# Patient Record
Sex: Female | Born: 1970 | Race: White | Hispanic: No | Marital: Married | Smoking: Never smoker
Health system: Southern US, Community
[De-identification: ages and names within clinical notes are randomized; demographics above are authoritative.]

## PROBLEM LIST (undated history)

## (undated) DIAGNOSIS — F3289 Other specified depressive episodes: Secondary | ICD-10-CM

## (undated) DIAGNOSIS — K8020 Calculus of gallbladder without cholecystitis without obstruction: Secondary | ICD-10-CM

## (undated) DIAGNOSIS — M1990 Unspecified osteoarthritis, unspecified site: Secondary | ICD-10-CM

## (undated) DIAGNOSIS — F329 Major depressive disorder, single episode, unspecified: Secondary | ICD-10-CM

## (undated) DIAGNOSIS — E039 Hypothyroidism, unspecified: Secondary | ICD-10-CM

## (undated) DIAGNOSIS — J45909 Unspecified asthma, uncomplicated: Secondary | ICD-10-CM

## (undated) DIAGNOSIS — D649 Anemia, unspecified: Secondary | ICD-10-CM

## (undated) DIAGNOSIS — E7800 Pure hypercholesterolemia, unspecified: Secondary | ICD-10-CM

## (undated) DIAGNOSIS — R5381 Other malaise: Secondary | ICD-10-CM

## (undated) DIAGNOSIS — R5383 Other fatigue: Secondary | ICD-10-CM

## (undated) DIAGNOSIS — I722 Aneurysm of renal artery: Secondary | ICD-10-CM

## (undated) DIAGNOSIS — F419 Anxiety disorder, unspecified: Secondary | ICD-10-CM

## (undated) DIAGNOSIS — R42 Dizziness and giddiness: Secondary | ICD-10-CM

## (undated) DIAGNOSIS — I773 Arterial fibromuscular dysplasia: Secondary | ICD-10-CM

## (undated) DIAGNOSIS — K279 Peptic ulcer, site unspecified, unspecified as acute or chronic, without hemorrhage or perforation: Secondary | ICD-10-CM

## (undated) DIAGNOSIS — J309 Allergic rhinitis, unspecified: Secondary | ICD-10-CM

## (undated) DIAGNOSIS — G459 Transient cerebral ischemic attack, unspecified: Secondary | ICD-10-CM

## (undated) DIAGNOSIS — K219 Gastro-esophageal reflux disease without esophagitis: Secondary | ICD-10-CM

## (undated) DIAGNOSIS — M79609 Pain in unspecified limb: Secondary | ICD-10-CM

## (undated) DIAGNOSIS — J328 Other chronic sinusitis: Secondary | ICD-10-CM

## (undated) DIAGNOSIS — K769 Liver disease, unspecified: Secondary | ICD-10-CM

## (undated) DIAGNOSIS — R1011 Right upper quadrant pain: Secondary | ICD-10-CM

## (undated) DIAGNOSIS — N943 Premenstrual tension syndrome: Secondary | ICD-10-CM

## (undated) DIAGNOSIS — E042 Nontoxic multinodular goiter: Secondary | ICD-10-CM

## (undated) DIAGNOSIS — R519 Headache, unspecified: Secondary | ICD-10-CM

## (undated) HISTORY — DX: Other chronic sinusitis: J32.8

## (undated) HISTORY — DX: Right upper quadrant pain: R10.11

## (undated) HISTORY — DX: Other specified depressive episodes: F32.89

## (undated) HISTORY — DX: Hypothyroidism, unspecified: E03.9

## (undated) HISTORY — DX: Pure hypercholesterolemia, unspecified: E78.00

## (undated) HISTORY — PX: 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: SHX888

## (undated) HISTORY — DX: Arterial fibromuscular dysplasia: I77.3

## (undated) HISTORY — DX: Pain in unspecified limb: M79.609

## (undated) HISTORY — DX: Allergic rhinitis, unspecified: J30.9

## (undated) HISTORY — DX: Peptic ulcer, site unspecified, unspecified as acute or chronic, without hemorrhage or perforation: K27.9

## (undated) HISTORY — DX: Calculus of gallbladder without cholecystitis without obstruction: K80.20

## (undated) HISTORY — DX: Dizziness and giddiness: R42

## (undated) HISTORY — DX: Other fatigue: R53.83

## (undated) HISTORY — DX: Liver disease, unspecified: K76.9

## (undated) HISTORY — DX: Nontoxic multinodular goiter: E04.2

## (undated) HISTORY — DX: Premenstrual tension syndrome: N94.3

## (undated) HISTORY — DX: Major depressive disorder, single episode, unspecified: F32.9

## (undated) HISTORY — DX: Other malaise: R53.81

## (undated) HISTORY — DX: Gastro-esophageal reflux disease without esophagitis: K21.9

## (undated) HISTORY — DX: Aneurysm of renal artery: I72.2

## (undated) MED FILL — Iron Sucrose Inj 20 MG/ML (Fe Equiv): INTRAVENOUS | Qty: 10 | Status: AC

---

## 1998-01-07 HISTORY — PX: LEEP: SHX91

## 1999-04-03 ENCOUNTER — Encounter: Payer: Self-pay | Admitting: Family Medicine

## 2000-01-08 HISTORY — PX: DILATION AND CURETTAGE OF UTERUS: SHX78

## 2007-10-30 ENCOUNTER — Encounter: Payer: Self-pay | Admitting: Family Medicine

## 2007-11-18 ENCOUNTER — Encounter: Payer: Self-pay | Admitting: Family Medicine

## 2008-02-08 ENCOUNTER — Encounter: Payer: Self-pay | Admitting: Family Medicine

## 2008-05-07 LAB — CONVERTED CEMR LAB: Pap Smear: NORMAL

## 2008-05-13 ENCOUNTER — Encounter: Payer: Self-pay | Admitting: Family Medicine

## 2008-09-08 ENCOUNTER — Ambulatory Visit: Payer: Self-pay | Admitting: Family Medicine

## 2008-09-08 DIAGNOSIS — E042 Nontoxic multinodular goiter: Secondary | ICD-10-CM | POA: Insufficient documentation

## 2008-09-08 DIAGNOSIS — R1011 Right upper quadrant pain: Secondary | ICD-10-CM

## 2008-09-08 DIAGNOSIS — Z8639 Personal history of other endocrine, nutritional and metabolic disease: Secondary | ICD-10-CM

## 2008-09-08 DIAGNOSIS — K219 Gastro-esophageal reflux disease without esophagitis: Secondary | ICD-10-CM | POA: Insufficient documentation

## 2008-09-08 DIAGNOSIS — J309 Allergic rhinitis, unspecified: Secondary | ICD-10-CM

## 2008-09-08 DIAGNOSIS — K279 Peptic ulcer, site unspecified, unspecified as acute or chronic, without hemorrhage or perforation: Secondary | ICD-10-CM | POA: Insufficient documentation

## 2008-09-08 DIAGNOSIS — F325 Major depressive disorder, single episode, in full remission: Secondary | ICD-10-CM | POA: Insufficient documentation

## 2008-09-08 DIAGNOSIS — E7800 Pure hypercholesterolemia, unspecified: Secondary | ICD-10-CM | POA: Insufficient documentation

## 2008-09-08 DIAGNOSIS — G43009 Migraine without aura, not intractable, without status migrainosus: Secondary | ICD-10-CM

## 2008-12-12 ENCOUNTER — Ambulatory Visit: Payer: Self-pay | Admitting: Family Medicine

## 2008-12-12 DIAGNOSIS — J328 Other chronic sinusitis: Secondary | ICD-10-CM

## 2009-01-04 ENCOUNTER — Telehealth: Payer: Self-pay | Admitting: Family Medicine

## 2009-05-16 ENCOUNTER — Ambulatory Visit: Payer: Self-pay | Admitting: Family Medicine

## 2009-05-22 ENCOUNTER — Telehealth: Payer: Self-pay | Admitting: Family Medicine

## 2009-10-04 ENCOUNTER — Ambulatory Visit: Payer: Self-pay | Admitting: Family Medicine

## 2009-10-04 DIAGNOSIS — M79609 Pain in unspecified limb: Secondary | ICD-10-CM | POA: Insufficient documentation

## 2009-10-04 DIAGNOSIS — R5381 Other malaise: Secondary | ICD-10-CM | POA: Insufficient documentation

## 2009-10-04 DIAGNOSIS — R42 Dizziness and giddiness: Secondary | ICD-10-CM | POA: Insufficient documentation

## 2009-10-04 DIAGNOSIS — R5383 Other fatigue: Secondary | ICD-10-CM

## 2009-10-11 ENCOUNTER — Ambulatory Visit: Payer: Self-pay | Admitting: Family Medicine

## 2009-10-12 LAB — CONVERTED CEMR LAB
Alkaline Phosphatase: 57 units/L (ref 39–117)
Basophils Absolute: 0 10*3/uL (ref 0.0–0.1)
Bilirubin, Direct: 0.1 mg/dL (ref 0.0–0.3)
CO2: 29 meq/L (ref 19–32)
Calcium: 8.9 mg/dL (ref 8.4–10.5)
Eosinophils Absolute: 0.1 10*3/uL (ref 0.0–0.7)
GFR calc non Af Amer: 78.18 mL/min (ref >60)
HCT: 36.4 % (ref 36.0–46.0)
Hemoglobin: 12.4 g/dL (ref 12.0–15.0)
Lymphs Abs: 1.5 10*3/uL (ref 0.7–4.0)
MCHC: 34 g/dL (ref 30.0–36.0)
MCV: 94 fL (ref 78.0–100.0)
Neutro Abs: 4.5 10*3/uL (ref 1.4–7.7)
RDW: 13.4 % (ref 11.5–14.6)
Sodium: 140 meq/L (ref 135–145)
Total Bilirubin: 0.5 mg/dL (ref 0.3–1.2)

## 2009-11-09 ENCOUNTER — Telehealth: Payer: Self-pay | Admitting: Family Medicine

## 2010-01-17 ENCOUNTER — Telehealth: Payer: Self-pay | Admitting: Family Medicine

## 2010-01-19 ENCOUNTER — Ambulatory Visit: Admit: 2010-01-19 | Payer: Self-pay | Admitting: Family Medicine

## 2010-01-22 ENCOUNTER — Telehealth: Payer: Self-pay | Admitting: Family Medicine

## 2010-02-06 NOTE — Progress Notes (Signed)
Summary: Request for Motrin 800mg   Phone Note Call from Patient   Caller: Patient Reason for Call: Refill Medication Action Taken: Patient advised to go to ER Details for Reason: Requesting Motrin 800mg  for migraine Summary of Call: Pt states she has had a migraine for a week and has taken 800mg  of Motrin in the past with relief.  Pt states that she was in the office last week for a sinus infection and that she received a muscle relaxer at that time and that the muscle relaxer, in her words, "knocks her out" and she can't take because she has six children and this is not working for her.  Patient said that she would like to ask for a prescription for Motrin 800mg  [facility].  The patient can be reached at [phone].  Thanks Initial call taken by: Clarisa Schools,  May 22, 2009 3:23 PM  Follow-up for Phone Call        Patient said it is okay to leave a message on her voice mail if she does not answer when you call back. Follow-up by: Clarisa Schools,  May 22, 2009 3:26 PM  Additional Follow-up for Phone Call Additional follow up Details #1::        PUD - I would avoid in this case With persistent HA and only dosing of Amox 500 mg a day two times a day, would consider high dose therapy. Amox 1500 mg by mouth two times a day vs Augmentin 875 two times a day  With sinus infection  Non-emergent - I would rather get Dr. Daphine Deutscher input in AM with this, since I am not familiar with case Additional Follow-up by: Hannah Beat MD,  May 22, 2009 3:54 PM    Additional Follow-up for Phone Call Additional follow up Details #2::    Short term ibupfroen as long as taken with prilosec 40 mg daily should be okay in this pt.  COntinue current antibiotic plan.   Additional Follow-up for Phone Call Additional follow up Details #3:: Details for Additional Follow-up Action Taken: left message with info on machine that patient okayed.Consuello Masse CMA  Additional Follow-up  by: Benny Lennert CMA Duncan Dull),  May 23, 2009 9:06 AM  New/Updated Medications: IBUPROFEN 800 MG TABS (IBUPROFEN) 1 tab by mouth three times a day as needed headache Prescriptions: IBUPROFEN 800 MG TABS (IBUPROFEN) 1 tab by mouth three times a day as needed headache  #15 x 0   Entered by:   Kerby Nora MD   Authorized by:   Hannah Beat MD   Signed by:   Kerby Nora MD on 05/22/2009   Method used:   [facility]        [facility]  [place] (retail)       [address]       [place], [place]  [address]       Ph: [phone] or [phone]       Fax: [phone]   RxID:   [id number]

## 2010-02-06 NOTE — Progress Notes (Signed)
Summary: Request change of medication  Phone Note Call from Patient Call back at [phone]   Caller: Patient Call For: Kerby Nora MD Summary of Call: Patient has been  taking Motrin for her headaches and helps some. Patient said that her brother takes Naproxen 500mg  for his headaches and that works good for him. Patient wants to know if you will prescribe Naproxen for her?. [facility] Initial call taken by: Sydell Axon LPN,  November 09, 2009 12:21 PM  Follow-up for Phone Call        alleve 2 tabs twice a day is same as this dose of naprosyn   if req rx call in naprosyn 500 mg, 1 by mouth two times a day, #60, 3 refills Follow-up by: Hannah Beat MD,  November 09, 2009 12:55 PM  Additional Follow-up for Phone Call Additional follow up Details #1::        Patient advised of info and rx sent to pharmacy as requested by patient.Consuello Masse CMA   Additional Follow-up by: Benny Lennert CMA Duncan Dull),  November 09, 2009 12:58 PM    New/Updated Medications: NAPROSYN 500 MG TABS (NAPROXEN) take one tablet 2 times daily Prescriptions: NAPROSYN 500 MG TABS (NAPROXEN) take one tablet 2 times daily  #60 x 3   Entered by:   Benny Lennert CMA (AAMA)   Authorized by:   Hannah Beat MD   Signed by:   Benny Lennert CMA (AAMA) on 11/09/2009   Method used:   [facility]        [facility]  [place] (retail)       [address]       [place], [place]  [address]       Ph: [phone] or [phone]       Fax: [phone]   RxID:   [id number]

## 2010-02-06 NOTE — Assessment & Plan Note (Signed)
Summary: sinus infection/alc   Vital Signs:  Patient profile:   40 year old female Weight:      137.75 pounds Temp:     98.7 degrees F oral Pulse rate:   68 / minute Pulse rhythm:   regular BP sitting:   120 / 84  (left arm) Cuff size:   regular  Vitals Entered By: Sydell Axon LPN (May 16, 2009 3:59 PM) CC: ? Sinus infection, headaches, some dizziness, sore throat and sinus pressure X 3 weeks   History of Present Illness: Pt thinks she has a sinus infection. She has headaches left sided, she has had chills, mild right ear pain, mils rightsided ST, no rhinitis but nasal congestion, mild cough at night minimally productive. She had sxs for three weeks, last Mon better and thought she was getting over this and then worsened again. She deniesSOB, some nausea with the headaches, no vomoiting. She has had sinus problems for a long time in her life, has been told she needs sinus surgey and has a deviated septum. She has taken sudafed and Zyrtec. She also relates often having right sided posterior neck pain and headaches and has had TMJ discomfort, seen by massage therapist with some help. She has also had some sluggishness of hearing distortion at times.  Problems Prior to Update: 1)  Other Chronic Sinusitis  (ICD-473.8) 2)  Abdominal Pain, Lower  (ICD-789.09) 3)  Allergic Rhinitis  (ICD-477.9) 4)  Goiter, Multinodular  (ICD-241.1) 5)  Hx of Unspecified Hypothyroidism  (ICD-244.9) 6)  Hypercholesterolemia  (ICD-272.0) 7)  Peptic Ulcer Disease  (ICD-533.90) 8)  Gerd  (ICD-530.81) 9)  Migraine, Menstrual  (ICD-625.4) 10)  Depression  (ICD-311)  Medications Prior to Update: 1)  Sumatriptan Succinate 100 Mg Tabs (Sumatriptan Succinate) .Marland Kitchen.. 1 Tab By Mouth As Needed Migraine, Repeat in 2 Hours X 1 If Not Improved. Max 200 Mg in 24 Hours.  Allergies: No Known Drug Allergies  Physical Exam  General:  Well-developed,well-nourished,in no acute distress; alert,appropriate and cooperative  throughout examination Head:  Normocephalic and atraumatic without obvious abnormalities. No apparent alopecia or balding. Sinuses minimally tender in the right max area.  Eyes:  Conjunctiva clear bilaterally.  Ears:  TMS bilat thick and scarred, no erythema, can't discern fluid but has poor mobility. Nose:  Deviated left with narrowed nare on left, mild erythema of the mucosal membr bilat. Mouth:  pharynx pink and moist.  Mild tan PND. Neck:  no carotid bruit or thyromegaly no cervical or supraclavicular lymphadenopathy  Lungs:  Normal respiratory effort, chest expands symmetrically. Lungs are clear to auscultation, no crackles or wheezes. Heart:  Normal rate and regular rhythm. S1 and S2 normal without gallop, murmur, click, rub or other extra sounds.   Impression & Recommendations:  Problem # 1:  OTHER CHRONIC SINUSITIS (ICD-473.8) Assessment Deteriorated Recurrent. See instructions. Her updated medication list for this problem includes:    Amoxicillin 500 Mg Caps (Amoxicillin) .Marland Kitchen... 2 tabs by mouth two times a day for three weeks Have cup of Dannon yogurt daily with Amox.  Take antibiotics for full duration. Discussed treatment options including indications for coronal CT scan of sinuses and ENT referral. When sxs improve, stay on one Guaifenesin daily.  Problem # 2:  HEADACHE, TENSION RIGHTSIDED (ICD-307.81) Assessment: New  Trial of flexeril and tyl. Also use heat and ice. Avoid NSAIDs due to PUD. Her updated medication list for this problem includes:    Sumatriptan Succinate 100 Mg Tabs (Sumatriptan succinate) .Marland Kitchen... 1 tab by mouth  as needed migraine, repeat in 2 hours x 1 if not improved. max 200 mg in 24 hours.  Headache diary reviewed.  Complete Medication List: 1)  Sumatriptan Succinate 100 Mg Tabs (Sumatriptan succinate) .Marland Kitchen.. 1 tab by mouth as needed migraine, repeat in 2 hours x 1 if not improved. max 200 mg in 24 hours. 2)  Amoxicillin 500 Mg Caps (Amoxicillin) .... 2  tabs by mouth two times a day for three weeks 3)  Flexeril 10 Mg Tabs (Cyclobenzaprine hcl) .Marland Kitchen.. 1 tab by mouth three times a day  Patient Instructions: 1)  Take Amox for three weeks. 2)  Take Guaifenesin by [facility] and getting MUCOUS RELIEF EXPECTORANT (400mg ), take 11/2 tabs by mouth AM and NOON. 3)  Drink lots of fluids anytime taking Guaifenesin.  4)  Tyl ES 2 tabs  5)  25 mns spent after OBTW tension h/as. 6)  Use flexeril for tension headaches.  Prescriptions: FLEXERIL 10 MG TABS (CYCLOBENZAPRINE HCL) 1 tab by mouth three times a day  #40 x 0   Entered and Authorized by:   Shaune Leeks MD   Signed by:   Shaune Leeks MD on 05/16/2009   Method used:   [facility]        [facility]  [id number] ([facility]ail)       [address]       [place], [place]  [address]       Ph: [phone] or [phone]       Fax: [phone]   RxID:   [id number] AMOXICILLIN 500 MG CAPS (AMOXICILLIN) 2 tabs by mouth two times a day for three weeks  #84 x 0   Entered and Authorized by:   Shaune Leeks MD   Signed by:   Shaune Leeks MD on 05/16/2009   Method used:   Electronically to        [facility]  [place] (retail)       [address]       [place], [place]  [address]       Ph: [phone] or [phone]       Fax: [phone]   RxID:   [id number]   Current Allergies (reviewed today): No known allergies

## 2010-02-06 NOTE — Assessment & Plan Note (Signed)
Summary: 30 MIN F/U/CLE   Vital Signs:  Patient profile:   40 year old female Height:      62 inches Weight:      137 pounds BMI:     25.15 Temp:     99.1 degrees F oral Pulse rate:   72 / minute Pulse rhythm:   regular BP sitting:   110 / 78  (left arm) Cuff size:   regular  Vitals Entered By: Linde Gillis CMA Duncan Dull) (October 04, 2009 4:10 PM) CC: 30 minute follow up   History of Present Illness:   Last seen 1 year ago..never returned for 2 week follow up on multiple medical issues.   Overdue for thyroid check. HAs history   Legs weak and lightheaded (no vertigo) when walking up stairs, but able to do aerobics with no problem. Mild associated SOB, no chest pain.  Ongoing in past few months.  Exposed to mold recently.  Using Zyrtec..minimal benifit.   In last few days congestion..sinus pressure pain. No fever Has chronic sinus infection history..Told by ENT .  she would need sinus surgery.  Use guafenesin daily...helps somewhat.   Taking hydroxycut daily to help with fatigue.  HAving trouble losing weight. Skipping meals   Allergies (verified): No Known Drug Allergies  Review of Systems       feet cold a lot urinary frequency if drinks more fluids.  intermittant pain in right upper quadrant...last few hours... occuring several hours after eating fatty food. General:  Complains of fatigue. CV:  Denies chest pain or discomfort. Resp:  Denies shortness of breath. GI:  Denies vomiting.  Physical Exam  General:  Well-developed,well-nourished,in no acute distress; alert,appropriate and cooperative throughout examination Eyes:  No corneal or conjunctival inflammation noted. EOMI. Perrla. Funduscopic exam benign, without hemorrhages, exudates or papilledema. Vision grossly normal. Ears:  External ear exam shows no significant lesions or deformities.  Otoscopic examination reveals clear canals, tympanic membranes are intact bilaterally without bulging,  retraction, inflammation or discharge. Hearing is grossly normal bilaterally. Nose:  nasal dischargemucosal pallor.   Mouth:  Oral mucosa and oropharynx without lesions or exudates.  Teeth in good repair. Neck:  no carotid bruit or thyromegaly no cervical or supraclavicular lymphadenopathy  Lungs:  Normal respiratory effort, chest expands symmetrically. Lungs are clear to auscultation, no crackles or wheezes. Heart:  Normal rate and regular rhythm. S1 and S2 normal without gallop, murmur, click, rub or other extra sounds. Abdomen:  Bowel sounds positive,abdomen soft and non-tender without masses, organomegaly or hernias noted. Msk:  No pain , redness or swelling in toes currently Pulses:  R and L posterior tibial pulses are full and equal bilaterally  Extremities:  no edema Neurologic:  No cranial nerve deficits noted. Station and gait are normal. DTRs are symmetrical throughout. Sensory, motor and coordinative functions appear intact. Skin:  Intact without suspicious lesions or rashes Psych:  Cognition and judgment appear intact. Alert and cooperative with normal attention span and concentration. No apparent delusions, illusions, hallucinations   Impression & Recommendations:  Problem # 1:  DIZZINESS (ICD-780.4) Likely due to poor diet and hydration status...may be due to thyroid issue and /or allergies. Eval with labs.   Problem # 2:  FATIGUE (ICD-780.79) Eval with labs.   Problem # 3:  ALLERGIC RHINITIS (ICD-477.9) Treat with Zyrtec at bedtime..no decongestant. Nasal saline irrigation. Consider nasal steroid spray if not improving.   Problem # 4:  GOITER, MULTINODULAR (ICD-241.1) Over due for reeval.   Problem # 5:  HYPERCHOLESTEROLEMIA (ICD-272.0) Over due for reeval.   Problem # 6:  RUQ PAIN (ICD-789.01) If labs negative consider gallbladder eval.   Problem # 7:  TOE PAIN (ICD-729.5) Pt concerned about gout as it runs in family.Marland Kitcheneval uric acid although pain is atypical for  gout.   Complete Medication List: 1)  Ibuprofen 800 Mg Tabs (Ibuprofen) .Marland Kitchen.. 1 tab by mouth three times a day as needed headache  Patient Instructions: 1)  Fasting lipids, CMET Dx 272.0, TSH Dx 244.9, B12, cbc Dx 780.79, uric acid Dx 729.5 2)  Try to increase water in diet, eat three meals in a day. 3)   Increase fiber, fruit and veggies. 4)  Zyrtec at bedtime..no decongestant. 5)  Stop hydroxycut.Marland Kitchenit is a stimulant. 6)  Nasal saline irrigation...3-4 times daily.  7)  Schedule CPX with PAP. 8)  Work on [employer].Marland Kitchenlow fat diet.  Current Allergies (reviewed today): No known allergies

## 2010-02-08 NOTE — Letter (Signed)
Summary: Records from [facility] 2008 - 2010  Records from [facility] 2008 - 2010   Imported By: Maryln Gottron 01/30/2010 12:55:27  _____________________________________________________________________  External Attachment:    Type:   Image     Comment:   External Document

## 2010-02-08 NOTE — Progress Notes (Signed)
Summary: previous md  Phone Note From Other Clinic   Details for Reason: Old Records Summary of Call: Reviewed old records from [place]. Please call pt ... she needs to have CPX/pap (last in 05/2008) done here if not seeing GYN...if she is seeing GYN.Marland Kitchen update last pap.  Initial call taken by: Kerby Nora MD,  January 22, 2010 2:04 PM  Follow-up for Phone Call        Patient says her husband just swicthed jobs and they have no insurance right now so she will schedule when new insurances goes in effect Follow-up by: Benny Lennert CMA Duncan Dull),  January 23, 2010 8:11 AM

## 2010-02-08 NOTE — Progress Notes (Signed)
Summary: refill request for flexeril  Phone Note Refill Request Call back at Home Phone [phone] Message from:  Patient  Refills Requested: Medication #1:  flexeril This is no longer on med list.  Pt takes it for her migraines and she is out.  [facility]  Call pt and let her know when called in.  Initial call taken by: Lowella Petties CMA, AAMA,  January 17, 2010 9:05 AM  Follow-up for Phone Call        HSe was previously on flexeril 10 mg three times a day... I am not comfortable using it three times a day.. .can oversedate her..will write for 1 tab by mouth daily.  Follow-up by: Kerby Nora MD,  January 17, 2010 10:11 PM  Additional Follow-up for Phone Call Additional follow up Details #1::        Rx called to pharmacy Additional Follow-up by: Benny Lennert CMA Duncan Dull),  January 18, 2010 7:56 AM    New/Updated Medications: CYCLOBENZAPRINE HCL 10 MG TABS (CYCLOBENZAPRINE HCL) 1 tab by mouth daily as needed migraine Prescriptions: CYCLOBENZAPRINE HCL 10 MG TABS (CYCLOBENZAPRINE HCL) 1 tab by mouth daily as needed migraine  #30 x 0   Entered and Authorized by:   Kerby Nora MD   Signed by:   Kerby Nora MD on 01/17/2010   Method used:   Telephoned to ...       [facility]  [place] (retail)       [address]       [place], [place]  [address]       Ph: [phone] or [phone]       Fax: [phone]   RxID:   [id number]

## 2010-02-08 NOTE — Letter (Signed)
Summary: Records from Dr. Paralee Cancel  Records from Dr. Paralee Cancel   Imported By: Maryln Gottron 01/30/2010 12:52:37  _____________________________________________________________________  External Attachment:    Type:   Image     Comment:   External Document

## 2010-05-09 ENCOUNTER — Encounter: Payer: Self-pay | Admitting: Family Medicine

## 2010-05-09 ENCOUNTER — Ambulatory Visit (INDEPENDENT_AMBULATORY_CARE_PROVIDER_SITE_OTHER): Payer: 59 | Admitting: Family Medicine

## 2010-05-09 DIAGNOSIS — B9689 Other specified bacterial agents as the cause of diseases classified elsewhere: Secondary | ICD-10-CM | POA: Insufficient documentation

## 2010-05-09 DIAGNOSIS — J019 Acute sinusitis, unspecified: Secondary | ICD-10-CM

## 2010-05-09 MED ORDER — AZITHROMYCIN 250 MG PO TABS: ORAL_TABLET | ORAL | Status: AC

## 2010-05-09 NOTE — Assessment & Plan Note (Signed)
In history of chronic sinusitis. States recently used agumentin (1-2 mo ago), zpack usually works well for her.   Will treat with zpack and supportive care as outlined in instructions. Red flags to return discussed.

## 2010-05-09 NOTE — Patient Instructions (Signed)
You have a sinus infection. Take medicine as prescribed: antibiotic as prescribed Push fluids and plenty of rest. Nasal saline irrigation or neti pot to help drain sinuses. May use simple mucinex with plenty of fluid to help mobilize mucous. Return if fever >101.5, trouble opening/closing mouth, difficulty swallowing, or worsening.

## 2010-05-09 NOTE — Progress Notes (Signed)
  Subjective:    Patient ID: Isabel Foley, female    DOB: 05-08-1970, 40 y.o.   MRN: [id number]  HPI CC: HA  10 d h/o sinus pressure HA behind R eye, as well as chills and dizziness.  Started with achiness.  Almost like a migraine (h/o same).  Some R eye blurriness.  No PNDrainage.  Not really having to blow nose but feels it's all stuck inside sinuses.  Recently seen by dentist, told teeth looking fine.  Mild R ear pain  H/o deviated septum as well as small cyst R sinuses.  Seen by ENT in past ([place]).  Told has chronic sinusitis.  Trying mucinex sinus as well as alavert.  No cough, abd pain, n/v/d, myalgias.  Husband smokes outside.  No sick contacts.  No h/o asthma.  States had recent abx course with augmentin 2 mo ago strep throat infection, treated at [facility].  Review of Systems Per HPI    Objective:   Physical Exam  Vitals reviewed. Constitutional: She appears well-developed and well-nourished. No distress.  HENT:  Head: Normocephalic and atraumatic.  Right Ear: Hearing, tympanic membrane, external ear and ear canal normal.  Left Ear: Hearing, tympanic membrane, external ear and ear canal normal.  Nose: No mucosal edema or rhinorrhea. Right sinus exhibits maxillary sinus tenderness and frontal sinus tenderness. Left sinus exhibits no maxillary sinus tenderness and no frontal sinus tenderness.  Mouth/Throat: Uvula is midline, oropharynx is clear and moist and mucous membranes are normal. No oropharyngeal exudate.  Eyes: Conjunctivae and EOM are normal. Pupils are equal, round, and reactive to light. No scleral icterus.       No pain with eye movements  Neck: Normal range of motion. Neck supple. No JVD present. No thyromegaly present.  Cardiovascular: Normal rate, regular rhythm, normal heart sounds and intact distal pulses.   No murmur heard. Pulmonary/Chest: Effort normal and breath sounds normal. No respiratory distress. She has no wheezes. She has no rales.  Lymphadenopathy:   She has no cervical adenopathy.  Skin: Skin is warm and dry. No rash noted.          Assessment & Plan:

## 2010-09-11 ENCOUNTER — Other Ambulatory Visit: Payer: Self-pay | Admitting: Family Medicine

## 2010-10-31 ENCOUNTER — Encounter: Payer: Self-pay | Admitting: Family Medicine

## 2010-10-31 ENCOUNTER — Ambulatory Visit (INDEPENDENT_AMBULATORY_CARE_PROVIDER_SITE_OTHER): Payer: 59 | Admitting: Family Medicine

## 2010-10-31 VITALS — BP 102/78 | HR 83 | Temp 98.8°F | Ht 62.0 in | Wt 123.4 lb

## 2010-10-31 DIAGNOSIS — R300 Dysuria: Secondary | ICD-10-CM

## 2010-10-31 DIAGNOSIS — N390 Urinary tract infection, site not specified: Secondary | ICD-10-CM

## 2010-10-31 LAB — POCT URINALYSIS DIPSTICK
Blood, UA: NEGATIVE
Nitrite, UA: NEGATIVE
Spec Grav, UA: 1.015
Urobilinogen, UA: NEGATIVE
pH, UA: 7

## 2010-10-31 MED ORDER — FLUCONAZOLE 150 MG PO TABS: 150.0000 mg | ORAL_TABLET | Freq: Once | ORAL | Status: AC

## 2010-10-31 MED ORDER — NITROFURANTOIN MONOHYD MACRO 100 MG PO CAPS: 100.0000 mg | ORAL_CAPSULE | Freq: Two times a day (BID) | ORAL | Status: AC

## 2010-10-31 NOTE — Progress Notes (Signed)
  Subjective:    Patient ID: Isabel Foley, female    DOB: 01/13/1970, 40 y.o.   MRN: [id number]  HPI  Isabel Foley, a 40 y.o. female presents today in the office for the following:    Pain in low back, sometimes nausous. Urine has been cloudy -- had some test strips.   Patient presents with burning, urgency. No vaginal discharge or external irritation.  No STD exposure. No abd pain, no flank pain.  ROS: GEN:  no fevers, chills. GI: No n/v/d, eating normally Otherwise, ROS is as per the HPI.  PHYSICAL EXAM  Blood pressure 102/78, pulse 83, temperature 98.8 F (37.1 C), temperature source Oral, height 5\' 2"  (1.575 m), weight 123 lb 6.4 oz (55.974 kg), SpO2 98.00%.  GEN: WDWN, A&Ox4,NAD. Non-toxic HEENT: Atraumatc, normocephalic. CV: RRR, No M/G/R PULM: CTA B, No wheezes, crackles, or rhonchi ABD: S, NT, ND, +BS, no rebound. No CVAT. No suprapubic tenderness. EXT: No c/c/e  A/P: UTI. Rx with ABX as below Check cx Review of Systems     Objective:   Physical Exam        Assessment & Plan:

## 2011-01-21 ENCOUNTER — Ambulatory Visit (INDEPENDENT_AMBULATORY_CARE_PROVIDER_SITE_OTHER): Payer: 59 | Admitting: Family Medicine

## 2011-01-21 ENCOUNTER — Encounter: Payer: Self-pay | Admitting: Family Medicine

## 2011-01-21 VITALS — BP 104/74 | HR 80 | Temp 98.0°F | Ht 62.0 in | Wt 124.2 lb

## 2011-01-21 DIAGNOSIS — J029 Acute pharyngitis, unspecified: Secondary | ICD-10-CM

## 2011-01-21 DIAGNOSIS — J020 Streptococcal pharyngitis: Secondary | ICD-10-CM

## 2011-01-21 MED ORDER — FLUCONAZOLE 150 MG PO TABS: 150.0000 mg | ORAL_TABLET | Freq: Once | ORAL | Status: AC

## 2011-01-21 MED ORDER — AMOXICILLIN 500 MG PO CAPS: 500.0000 mg | ORAL_CAPSULE | Freq: Three times a day (TID) | ORAL | Status: AC

## 2011-01-21 NOTE — Patient Instructions (Signed)
Take the amoxicillin for strep  Drink lots of fluids  Use diflucan if you get a yeast infection Take acetaminophen for pain and fever  Chloraseptic throat spray and salt water gargles are helpful  I sent px to your pharmacy

## 2011-01-21 NOTE — Progress Notes (Signed)
Subjective:    Patient ID: Isabel Foley, female    DOB: 1970-07-05, 41 y.o.   MRN: [id number]  HPI Here with uri symptoms with sore throat and pos rapid strep test  Is exposed to a lot of moms and kids  Head is congested - cannot get a lot out - more post nasal drip  Also some cough -- is dry -- little mucous last night   Sore throat - hurts to swallow -- worse this am   Not sure if fever - last night had chills and aches Took mucinex complete today   Patient Active Problem List  Diagnoses  . GOITER, MULTINODULAR  . UNSPECIFIED HYPOTHYROIDISM  . HYPERCHOLESTEROLEMIA  . DEPRESSION  . OTHER CHRONIC SINUSITIS  . ALLERGIC RHINITIS  . GERD  . PEPTIC ULCER DISEASE  . MIGRAINE, MENSTRUAL  . TOE PAIN  . DIZZINESS  . FATIGUE  . RUQ PAIN  . Acute sinusitis  . Strep throat   Past Medical History  Diagnosis Date  . Allergic rhinitis, cause unspecified   . Depressive disorder, not elsewhere classified   . Dizziness and giddiness   . Other malaise and fatigue   . Esophageal reflux   . Nontoxic multinodular goiter   . Pure hypercholesterolemia   . Premenstrual tension syndromes   . Other chronic sinusitis   . Peptic ulcer, unspecified site, unspecified as acute or chronic, without mention of hemorrhage, perforation, or obstruction   . Abdominal pain, right upper quadrant   . Pain in limb   . Unspecified hypothyroidism    Past Surgical History  Procedure Date  . Dilation and curettage of uterus 2002  . Leep 2000   History  Substance Use Topics  . Smoking status: Never Smoker   . Smokeless tobacco: Not on file  . Alcohol Use: No   Family History  Problem Relation Age of Onset  . Hypertension Father   . Hypothyroidism Father   . Atrial fibrillation Brother 20  . Other Sister     Collagen Vascular Disease   No Known Allergies Current Outpatient Prescriptions on File Prior to Visit  Medication Sig Dispense Refill  . montelukast (SINGULAIR) 10 MG tablet Take 10 mg by  mouth at bedtime.            Review of Systems Review of Systems  Constitutional: Negative for fever, appetite change, fatigue and unexpected weight change.  Eyes: Negative for pain and visual disturbance.  ENt pos for ST and some congestion and ear pain / neg for sinus pain  Respiratory: Negative for cough and shortness of breath.   Cardiovascular: Negative for cp or palpitations    Gastrointestinal: Negative for nausea, diarrhea and constipation.  Genitourinary: Negative for urgency and frequency.  Skin: Negative for pallor or rash   Neurological: Negative for weakness, light-headedness, numbness and headaches.  Hematological: Negative for adenopathy. Does not bruise/bleed easily.  Psychiatric/Behavioral: Negative for dysphoric mood. The patient is not nervous/anxious.          Objective:   Physical Exam  Constitutional: She appears well-developed and well-nourished. No distress.  HENT:  Head: Normocephalic and atraumatic.  Right Ear: External ear normal.  Left Ear: External ear normal.  Mouth/Throat: No oropharyngeal exudate.       Moderate throat erythema - slt worse on R  No ulcers or swelling or exudate  Nares are boggy with some clear rhinorrhea   Eyes: Conjunctivae and EOM are normal. Pupils are equal, round, and reactive to light. Right  eye exhibits no discharge. Left eye exhibits no discharge.  Neck: Normal range of motion. Neck supple.  Cardiovascular: Normal rate, regular rhythm and normal heart sounds.   Pulmonary/Chest: Effort normal and breath sounds normal. No respiratory distress. She has no wheezes. She has no rales.  Musculoskeletal: She exhibits no edema.  Lymphadenopathy:    She has no cervical adenopathy.  Neurological: She is alert.  Skin: Skin is warm and dry. No rash noted. No erythema.  Psychiatric: She has a normal mood and affect.          Assessment & Plan:

## 2011-01-21 NOTE — Assessment & Plan Note (Signed)
With some uri symptoms also  tx with 10 d of amoxicillin Disc symptomatic care - see instructions on AVS  Urged to drink fluid and rest Update if not starting to improve in a week or if worsening

## 2011-02-15 ENCOUNTER — Telehealth: Payer: Self-pay | Admitting: Family Medicine

## 2011-02-15 NOTE — Telephone Encounter (Signed)
Triage Record Num: [id number] Operator: Arline Asp Loftin Patient Name: Isabel Foley Call Date & Time: 02/15/2011 12:35:03PM Patient Phone: [phone] PCP: Kerby Nora Patient Gender: Female PCP Fax : [phone] Patient DOB: [birth year] Practice Name: [facility] Day Reason for Call: Caller: Kim/Patient; PCP: Excell Seltzer.; CB#: [phone]; Call regarding Cough/Congestion; LMP: 01/25. Cough onset 02/06, congestion onset 02/03, afebrile. Phlegm is clear. Mucinex and Delsym taken with uncertain relief. Emergent sx negative. Home care advice per "Cough, Adult" and "Ear Symptoms" due to "new onset of 2 or more sx, nasal congestion, cough, itchy throat" and "ear fullness/pressure along with sx of a cold/URI, all triage questions negative. Call back parameters reviewed. Protocol(s) Used: Cough - Adult Recommended Outcome per Protocol: Provide Home/Self Care Reason for Outcome: New onset of two or more of the following symptoms: nasal congestion with runny nose; sneezing; itchy or mild sore throat; mild headache or body aches; mild fatigue; low grade fever up to 101.5 F (38.6C) usually lasting about a week Care Advice: ~ Use a cool mist humidifier to moisten air. Be sure to clean according to manufacturer's instructions. ~ Rest until symptoms improve. Call provider if difficulty breathing or wheezing develops or if cough becomes productive of green, yellow or brown sputum ~ ~ Consider use of a saline nasal spray per package directions to help relieve nasal congestion. ~ Call provider for evaluation of cough that lasts 2 weeks or more. Sore Throat Relief: - Use warm salt water gargles 3 to 4 times/day, as needed (1/2 tsp. salt in 8 oz. [.2 liters] water). - Suck on hard candy, nonprescription or herbal throat lozenges (sugar-free if diabetic) - Eat soothing, soft food/fluids (broths, soups, or honey and lemon juice in hot tea, Popsicles, frozen yogurt or sherbet, scrambled eggs, cooked  cereals, Jell-O or puddings) whichever is most comforting. - Avoid eating salty, spicy or acidic foods. ~ 02/15/2011 12:51:54PM Page 1 of 1 [email or web address] [facility] Triage Call Report Triage Record Num: [id number] Operator: Arline Asp Loftin Patient Name: Isabel Foley Call Date & Time: 02/15/2011 12:35:03PM Patient Phone: [phone] PCP: Kerby Nora Patient Gender: Female PCP Fax : [phone] Patient DOB: 1970/07/01 Practice Name: [facility] Day Reason for Call: Caller: Kim/Patient; PCP: Excell Seltzer.; CB#: [phone]; Call regarding Cough/Congestion; LMP: 01/25. Cough onset 02/06, congestion onset 02/03, afebrile. Phlegm is clear. Mucinex and Delsym taken with uncertain relief. Emergent sx negative. Home care advice per "Cough, Adult" and "Ear Symptoms" due to "new onset of 2 or more sx, nasal congestion, cough, itchy throat" and "ear fullness/pressure along with sx of a cold/URI, all triage questions negative. Call back parameters reviewed. Protocol(s) Used: Ear: Symptoms Recommended Outcome per Protocol: Provide Home/Self Care Reason for Outcome: Ear fullness/pressure along with symptoms of a cold/upper respiratory infection or diagnosed seasonal allergies Care Advice: ~ Call provider if symptoms do not respond to home care or symptoms interfere with sleep or normal activities. ~ SYMPTOM / CONDITION MANAGEMENT Consider nonprescription decongestant (Sudafed, Drixoral) for relief of symptoms after checking with a provider, especially if there is a history of hypertension, hyperthyroidism, heart disease, diabetes, glaucoma, urinary retention caused by prostatic hypertrophy. ~ 02/15/2011 12:51:56PM Page 1 of 1 [email or web address]

## 2011-05-01 ENCOUNTER — Telehealth: Payer: Self-pay | Admitting: Family Medicine

## 2011-05-01 ENCOUNTER — Ambulatory Visit (INDEPENDENT_AMBULATORY_CARE_PROVIDER_SITE_OTHER): Payer: 59 | Admitting: Family Medicine

## 2011-05-01 ENCOUNTER — Encounter: Payer: Self-pay | Admitting: Family Medicine

## 2011-05-01 VITALS — BP 110/80 | HR 72 | Temp 97.8°F | Wt 124.0 lb

## 2011-05-01 DIAGNOSIS — E042 Nontoxic multinodular goiter: Secondary | ICD-10-CM

## 2011-05-01 LAB — TSH: TSH: 1.47 u[IU]/mL (ref 0.35–5.50)

## 2011-05-01 NOTE — Patient Instructions (Signed)
It was good to see you. Please stop by to see Shirlee Limerick on your way out to set up you ultrasound. We will call results of your labwork.

## 2011-05-01 NOTE — Telephone Encounter (Signed)
Caller: Isabel Foley/Patient; PCP: Kerby Nora E.; CB#: [phone];  Call regarding Had Blood Drawn Around [facility] 05/01/11 and developed Painful Knot At L antecubital space; Knot has decreased in size; currently quarter size knot. No bruising or bleeding. Reviewd health advisor informatin on phlebitis.  Advised to see MD within 24 hrs for new signs and symptoms of infections including swelling and tenderness per Abrasions, Lacerations, Puncture Wounds Guideline. Declined appt now. Will monitor and call back for appt if worsens.

## 2011-05-01 NOTE — Progress Notes (Signed)
Subjective:    Patient ID: Isabel Foley, female    DOB: 1970-03-08, 41 y.o.   MRN: [id number]  HPI  41 yo pt with h/o multinodule goiter here for increased size of goiter.  Has not had an ultrasound in several years.  Taken off thyroid replacement years ago and lost to follow up. Past six months, more aware of left side of goiter- sometimes painful. No difficulty swallowing. She is sometimes having some hot/cold flashes and endorses increased fatigue.  Denies any other symptoms of hypo or hyperthyroidism.  Patient Active Problem List  Diagnoses  . GOITER, MULTINODULAR  . UNSPECIFIED HYPOTHYROIDISM  . HYPERCHOLESTEROLEMIA  . DEPRESSION  . OTHER CHRONIC SINUSITIS  . ALLERGIC RHINITIS  . GERD  . PEPTIC ULCER DISEASE  . MIGRAINE, MENSTRUAL  . TOE PAIN  . DIZZINESS  . FATIGUE  . RUQ PAIN  . Acute sinusitis  . Strep throat   Past Medical History  Diagnosis Date  . Allergic rhinitis, cause unspecified   . Depressive disorder, not elsewhere classified   . Dizziness and giddiness   . Other malaise and fatigue   . Esophageal reflux   . Nontoxic multinodular goiter   . Pure hypercholesterolemia   . Premenstrual tension syndromes   . Other chronic sinusitis   . Peptic ulcer, unspecified site, unspecified as acute or chronic, without mention of hemorrhage, perforation, or obstruction   . Abdominal pain, right upper quadrant   . Pain in limb   . Unspecified hypothyroidism    Past Surgical History  Procedure Date  . Dilation and curettage of uterus 2002  . Leep 2000   History  Substance Use Topics  . Smoking status: Never Smoker   . Smokeless tobacco: Not on file  . Alcohol Use: No   Family History  Problem Relation Age of Onset  . Hypertension Father   . Hypothyroidism Father   . Atrial fibrillation Brother 20  . Other Sister     Collagen Vascular Disease   No Known Allergies Current Outpatient Prescriptions on File Prior to Visit  Medication Sig Dispense Refill    . mometasone (NASONEX) 50 MCG/ACT nasal spray Place 2 sprays into the nose daily.      . montelukast (SINGULAIR) 10 MG tablet Take 10 mg by mouth at bedtime.        . Multiple Vitamin (MULTIVITAMIN) tablet Take 1 tablet by mouth daily.      . Probiotic Product (PROBIOTIC FORMULA PO) Take 1 tablet by mouth daily.       The PMH, PSH, Social History, Family History, Medications, and allergies have been reviewed in [facility], and have been updated if relevant.   Review of Systems See HPI    Objective:   Physical Exam BP 110/80  Pulse 72  Temp(Src) 97.8 F (36.6 C) (Oral)  Wt 124 lb (56.246 kg)  LMP 04/24/2011  General:  Well-developed,well-nourished,in no acute distress; alert,appropriate and cooperative throughout examination Head:  normocephalic and atraumatic.   Eyes:  vision grossly intact, pupils equal, pupils round, and pupils reactive to light.   Ears:  R ear normal and L ear normal.   Nose:  no external deformity.   Mouth:  good dentition.   Neck: pos palpable goiter, nodules NTTP Lungs:  Normal respiratory effort, chest expands symmetrically. Lungs are clear to auscultation, no crackles or wheezes. Heart:  Normal rate and regular rhythm. S1 and S2 normal without gallop, murmur, click, rub or other extra sounds. Msk:  No deformity or scoliosis  noted of thoracic or lumbar spine.   Extremities:  No clubbing, cyanosis, edema, or deformity noted with normal full range of motion of all joints.   Neurologic:  alert & oriented X3 and gait normal.   Skin:  Intact without suspicious lesions or rashes Psych:  Cognition and judgment appear intact. Alert and cooperative with normal attention span and concentration. No apparent delusions, illusions, hallucinations        Assessment & Plan:   1. GOITER, MULTINODULAR  US Soft Tissue Head/Neck, TSH, T4, Free   Deteriorated.  Will repeat thyroid ultrasound and thyroid labs. The patient indicates understanding of these issues and agrees  with the plan.

## 2011-05-01 NOTE — Telephone Encounter (Signed)
agree

## 2011-05-10 ENCOUNTER — Encounter: Payer: Self-pay | Admitting: Family Medicine

## 2011-05-10 ENCOUNTER — Ambulatory Visit: Payer: Self-pay | Admitting: Family Medicine

## 2011-06-14 ENCOUNTER — Ambulatory Visit (INDEPENDENT_AMBULATORY_CARE_PROVIDER_SITE_OTHER): Payer: 59 | Admitting: Family Medicine

## 2011-06-14 ENCOUNTER — Encounter: Payer: Self-pay | Admitting: Family Medicine

## 2011-06-14 VITALS — BP 94/62 | HR 77 | Temp 98.1°F | Ht 61.5 in | Wt 126.2 lb

## 2011-06-14 DIAGNOSIS — R350 Frequency of micturition: Secondary | ICD-10-CM

## 2011-06-14 DIAGNOSIS — R309 Painful micturition, unspecified: Secondary | ICD-10-CM

## 2011-06-14 DIAGNOSIS — R300 Dysuria: Secondary | ICD-10-CM

## 2011-06-14 LAB — POCT URINALYSIS DIPSTICK
Glucose, UA: NEGATIVE
Ketones, UA: NEGATIVE
Spec Grav, UA: <=1.005
Urobilinogen, UA: 0.2

## 2011-06-14 MED ORDER — FLUCONAZOLE 150 MG PO TABS: 150.0000 mg | ORAL_TABLET | Freq: Once | ORAL | Status: AC

## 2011-06-14 MED ORDER — CIPROFLOXACIN HCL 250 MG PO TABS: 250.0000 mg | ORAL_TABLET | Freq: Two times a day (BID) | ORAL | Status: AC

## 2011-06-14 NOTE — Progress Notes (Signed)
Subjective:    Patient ID: Isabel Foley, female    DOB: 1970/12/18, 41 y.o.   MRN: [id number]  HPI  Here with uti symptoms  Pain to urinate - about 10 days  Also urinating frequently and urgently  Today is a little better Is nauseated - but no vomiting  No blood in urine   She took ua test otc at home - pos for leukocytes   Patient Active Problem List  Diagnoses  . GOITER, MULTINODULAR  . UNSPECIFIED HYPOTHYROIDISM  . HYPERCHOLESTEROLEMIA  . DEPRESSION  . OTHER CHRONIC SINUSITIS  . ALLERGIC RHINITIS  . GERD  . PEPTIC ULCER DISEASE  . MIGRAINE, MENSTRUAL  . TOE PAIN  . DIZZINESS  . FATIGUE  . RUQ PAIN  . Acute sinusitis  . Strep throat   Past Medical History  Diagnosis Date  . Allergic rhinitis, cause unspecified   . Depressive disorder, not elsewhere classified   . Dizziness and giddiness   . Other malaise and fatigue   . Esophageal reflux   . Nontoxic multinodular goiter   . Pure hypercholesterolemia   . Premenstrual tension syndromes   . Other chronic sinusitis   . Peptic ulcer, unspecified site, unspecified as acute or chronic, without mention of hemorrhage, perforation, or obstruction   . Abdominal pain, right upper quadrant   . Pain in limb   . Unspecified hypothyroidism    Past Surgical History  Procedure Date  . Dilation and curettage of uterus 2002  . Leep 2000   History  Substance Use Topics  . Smoking status: Never Smoker   . Smokeless tobacco: Not on file  . Alcohol Use: No   Family History  Problem Relation Age of Onset  . Hypertension Father   . Hypothyroidism Father   . Atrial fibrillation Brother 20  . Other Sister     Collagen Vascular Disease   No Known Allergies Current Outpatient Prescriptions on File Prior to Visit  Medication Sig Dispense Refill  . mometasone (NASONEX) 50 MCG/ACT nasal spray Place 2 sprays into the nose daily.      . montelukast (SINGULAIR) 10 MG tablet Take 10 mg by mouth at bedtime.        . Multiple Vitamin  (MULTIVITAMIN) tablet Take 1 tablet by mouth daily.      . Probiotic Product (PROBIOTIC FORMULA PO) Take 1 tablet by mouth daily.          Review of Systems Review of Systems  Constitutional: Negative for fever, appetite change, fatigue and unexpected weight change.  Eyes: Negative for pain and visual disturbance.  Respiratory: Negative for cough and shortness of breath.   Cardiovascular: Negative for cp or palpitations    Gastrointestinal: Negative for nausea, diarrhea and constipation.  Genitourinary: pos for urgency and frequency. neg for back pain  Skin: Negative for pallor or rash   Neurological: Negative for weakness, light-headedness, numbness and headaches.  Hematological: Negative for adenopathy. Does not bruise/bleed easily.  Psychiatric/Behavioral: Negative for dysphoric mood. The patient is not nervous/anxious.         Objective:   Physical Exam  Constitutional: She appears well-developed and well-nourished. No distress.  HENT:  Head: Normocephalic and atraumatic.  Mouth/Throat: Oropharynx is clear and moist.  Eyes: Conjunctivae and EOM are normal. Pupils are equal, round, and reactive to light. No scleral icterus.  Neck: Normal range of motion. Neck supple.  Cardiovascular: Normal rate and regular rhythm.   Pulmonary/Chest: Effort normal and breath sounds normal.  Abdominal: Soft. Bowel  sounds are normal. She exhibits no distension. There is tenderness.       Mild suprapubic tenderness  Musculoskeletal:       No cva tenderness   Lymphadenopathy:    She has no cervical adenopathy.  Neurological: She is alert.  Skin: Skin is warm and dry. No rash noted.  Psychiatric: She has a normal mood and affect.          Assessment & Plan:

## 2011-06-14 NOTE — Patient Instructions (Addendum)
You have a mild uti  Drink lots of water Take the cipro for 5 days as directed Take diflucan if you get a yeast infection Update if not starting to improve in 2-3 days  or if worsening

## 2011-06-16 LAB — POCT UA - MICROSCOPIC ONLY

## 2011-11-13 ENCOUNTER — Ambulatory Visit (INDEPENDENT_AMBULATORY_CARE_PROVIDER_SITE_OTHER): Payer: 59 | Admitting: Family Medicine

## 2011-11-13 ENCOUNTER — Encounter: Payer: Self-pay | Admitting: Family Medicine

## 2011-11-13 VITALS — BP 108/80 | HR 62 | Temp 98.1°F | Ht 61.5 in | Wt 129.0 lb

## 2011-11-13 DIAGNOSIS — J019 Acute sinusitis, unspecified: Secondary | ICD-10-CM

## 2011-11-13 MED ORDER — FLUCONAZOLE 150 MG PO TABS: 150.0000 mg | ORAL_TABLET | Freq: Once | ORAL | Status: DC

## 2011-11-13 MED ORDER — AMOXICILLIN-POT CLAVULANATE 875-125 MG PO TABS: 1.0000 | ORAL_TABLET | Freq: Two times a day (BID) | ORAL | Status: DC

## 2011-11-13 NOTE — Progress Notes (Signed)
Subjective:    Patient ID: Isabel Foley, female    DOB: [birth year], 41 y.o.   MRN: [id number]  HPI Having sinus infection symptoms again  Ongoing but worse since Monday  Severe frontal sinus pain- worse on the right side / ears are ringing and stuffy  Cannot blow much out of her nose at all  Taking advil atc - so unsure if fever- has had the chills and the sweats   Started mucinex  Then switched to advil congestion  Also helps congestion   Tried netti pot - she is unable to get fluid all the way around  Does have a deviated septum   Has hx of migraines also - her sinus headaches do transform   Patient Active Problem List  Diagnosis  . GOITER, MULTINODULAR  . UNSPECIFIED HYPOTHYROIDISM  . HYPERCHOLESTEROLEMIA  . DEPRESSION  . OTHER CHRONIC SINUSITIS  . ALLERGIC RHINITIS  . GERD  . PEPTIC ULCER DISEASE  . MIGRAINE, MENSTRUAL  . TOE PAIN  . DIZZINESS  . FATIGUE  . RUQ PAIN  . Acute sinusitis  . Strep throat   Past Medical History  Diagnosis Date  . Allergic rhinitis, cause unspecified   . Depressive disorder, not elsewhere classified   . Dizziness and giddiness   . Other malaise and fatigue   . Esophageal reflux   . Nontoxic multinodular goiter   . Pure hypercholesterolemia   . Premenstrual tension syndromes   . Other chronic sinusitis   . Peptic ulcer, unspecified site, unspecified as acute or chronic, without mention of hemorrhage, perforation, or obstruction   . Abdominal pain, right upper quadrant   . Pain in limb   . Unspecified hypothyroidism    Past Surgical History  Procedure Date  . Dilation and curettage of uterus 2002  . Leep 2000   History  Substance Use Topics  . Smoking status: Never Smoker   . Smokeless tobacco: Not on file  . Alcohol Use: No   Family History  Problem Relation Age of Onset  . Hypertension Father   . Hypothyroidism Father   . Atrial fibrillation Brother 20  . Other Sister     Collagen Vascular Disease   No Known  Allergies Current Outpatient Prescriptions on File Prior to Visit  Medication Sig Dispense Refill  . mometasone (NASONEX) 50 MCG/ACT nasal spray Place 2 sprays into the nose daily.      . montelukast (SINGULAIR) 10 MG tablet Take 10 mg by mouth at bedtime.        . Multiple Vitamin (MULTIVITAMIN) tablet Take 1 tablet by mouth daily.      . NON FORMULARY Take 2 oz by mouth daily. Vemma Nutritional Supplement      . Probiotic Product (PROBIOTIC FORMULA PO) Take 1 tablet by mouth daily.           Review of Systems Review of Systems  Constitutional: Negative for fever, appetite change, and unexpected weight change. pos for fatigue  ENT pos for sinus pain and nasal congestion and post nasal drip  Eyes: Negative for pain and visual disturbance.  Respiratory: Negative for wheeze and shortness of breath.  pos for cough Cardiovascular: Negative for cp or palpitations    Gastrointestinal: Negative for nausea, diarrhea and constipation.  Genitourinary: Negative for urgency and frequency.  Skin: Negative for pallor or rash   Neurological: Negative for weakness, light-headedness, numbness and headaches.  Hematological: Negative for adenopathy. Does not bruise/bleed easily.  Psychiatric/Behavioral: Negative for dysphoric mood. The patient is  not nervous/anxious.         Objective:   Physical Exam  Constitutional: She appears well-developed and well-nourished. No distress.  HENT:  Head: Normocephalic and atraumatic.  Mouth/Throat: Oropharynx is clear and moist.       Nares are injected and congested  Bilateral TMs dull with effusions  bilat frontal sinus tenderness R maxillary sinus tenderness  Eyes: Conjunctivae normal and EOM are normal. Pupils are equal, round, and reactive to light. Right eye exhibits no discharge. Left eye exhibits no discharge.  Neck: Normal range of motion. Neck supple. No thyromegaly present.  Cardiovascular: Normal rate, regular rhythm and normal heart sounds.     Pulmonary/Chest: Effort normal and breath sounds normal. No respiratory distress. She has no wheezes. She has no rales.  Lymphadenopathy:    She has no cervical adenopathy.  Neurological: She is alert. No cranial nerve deficit.  Skin: Skin is warm and dry. No rash noted.  Psychiatric: She has a normal mood and affect.          Assessment & Plan:

## 2011-11-13 NOTE — Patient Instructions (Addendum)
Take augmentin for sinus infection  Use mucinex if it helps advil for pain and inflammation  Nasal saline spray  Take diflucan if you get a yeast infection Update if not starting to improve in a week or if worsening

## 2011-11-13 NOTE — Assessment & Plan Note (Signed)
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

## 2011-12-27 ENCOUNTER — Ambulatory Visit (INDEPENDENT_AMBULATORY_CARE_PROVIDER_SITE_OTHER): Payer: 59 | Admitting: Family Medicine

## 2011-12-27 ENCOUNTER — Encounter: Payer: Self-pay | Admitting: Family Medicine

## 2011-12-27 ENCOUNTER — Ambulatory Visit: Payer: 59 | Admitting: Family Medicine

## 2011-12-27 VITALS — BP 110/72 | HR 74 | Temp 98.4°F | Ht 61.5 in | Wt 130.2 lb

## 2011-12-27 DIAGNOSIS — J019 Acute sinusitis, unspecified: Secondary | ICD-10-CM

## 2011-12-27 DIAGNOSIS — N943 Premenstrual tension syndrome: Secondary | ICD-10-CM

## 2011-12-27 MED ORDER — NAPROXEN 500 MG PO TABS: 500.0000 mg | ORAL_TABLET | Freq: Three times a day (TID) | ORAL | Status: DC

## 2011-12-27 MED ORDER — LEVOFLOXACIN 500 MG PO TABS: 500.0000 mg | ORAL_TABLET | Freq: Every day | ORAL | Status: DC

## 2011-12-27 NOTE — Progress Notes (Signed)
  Subjective:    Patient ID: Isabel Foley, female    DOB: 1970/02/20, 41 y.o.   MRN: [id number]  Sinus Problem This is a recurrent (LAst treated in early 11/2011 with augmentin. She reports she was never 100% better.. sinus pressure remained.) problem. The current episode started 1 to 4 weeks ago (2 weeks ago worsened). Maximum temperature: low grade. The fever has been present for 1 to 2 days. Associated symptoms include congestion, coughing, ear pain, headaches, a hoarse voice, sinus pressure and a sore throat. (Occ blurry vision on left side.  pain in sinus severe and on left side  left ear pain occ wheeze) Treatments tried: using mucinex, sudafed, advil, on singulair.Marland Kitchen stopped nasonex. The treatment provided mild relief.    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   Respiratory: Positive for cough.   Neurological: Positive for headaches.       Objective:   Physical Exam  Constitutional: Vital signs are normal. She appears well-developed and well-nourished. She is cooperative.  Non-toxic appearance. She does not appear ill. No distress.  HENT:  Head: Normocephalic.  Right Ear: Hearing, tympanic membrane, external ear and ear canal normal. Tympanic membrane is not erythematous, not retracted and not bulging.  Left Ear: Hearing, tympanic membrane, external ear and ear canal normal. Tympanic membrane is not erythematous, not retracted and not bulging.  Nose: Mucosal edema and rhinorrhea present. Right sinus exhibits no maxillary sinus tenderness and no frontal sinus tenderness. Left sinus exhibits maxillary sinus tenderness and frontal sinus tenderness.  Mouth/Throat: Uvula is midline, oropharynx is clear and moist and mucous membranes are normal.  Eyes: Conjunctivae normal, EOM and lids are normal. Pupils are equal, round, and reactive to  light. No foreign bodies found.  Neck: Trachea normal and normal range of motion. Neck supple. Carotid bruit is not present. No mass and no thyromegaly present.  Cardiovascular: Normal rate, regular rhythm, S1 normal, S2 normal, normal heart sounds, intact distal pulses and normal pulses.  Exam reveals no gallop and no friction rub.   No murmur heard. Pulmonary/Chest: Effort normal and breath sounds normal. Not tachypneic. No respiratory distress. She has no decreased breath sounds. She has no wheezes. She has no rhonchi. She has no rales.  Neurological: She is alert.  Skin: Skin is warm, dry and intact. No rash noted.  Psychiatric: Her speech is normal and behavior is normal. Judgment normal. Her mood appears not anxious. Cognition and memory are normal. She does not exhibit a depressed mood.          Assessment & Plan:

## 2011-12-27 NOTE — Assessment & Plan Note (Signed)
Treat with longer course of broader antibiotics. Add nasal saline irrigation to regimen.

## 2011-12-27 NOTE — Patient Instructions (Addendum)
Start antibiotic. Continue mucinex D. Restart nasal steroid spray. Start nasal saline irrigation 2-3 times day. Call if symptoms not improving in 72 hours. Call if interested in ENT referral. If migraine increasing in frequency.. Make appt to discuss.

## 2011-12-27 NOTE — Assessment & Plan Note (Signed)
Occuring more frequently. Keep diary. Retunr for visit to discuss migraine prophylaxis if not improving.

## 2012-07-29 ENCOUNTER — Ambulatory Visit (INDEPENDENT_AMBULATORY_CARE_PROVIDER_SITE_OTHER): Payer: 59 | Admitting: Family Medicine

## 2012-07-29 VITALS — BP 120/78 | HR 79 | Temp 98.5°F | Ht 61.5 in | Wt 133.0 lb

## 2012-07-29 DIAGNOSIS — J0100 Acute maxillary sinusitis, unspecified: Secondary | ICD-10-CM

## 2012-07-29 MED ORDER — FLUCONAZOLE 150 MG PO TABS: 150.0000 mg | ORAL_TABLET | Freq: Once | ORAL | Status: DC

## 2012-07-29 MED ORDER — AMOXICILLIN-POT CLAVULANATE 875-125 MG PO TABS: 1.0000 | ORAL_TABLET | Freq: Two times a day (BID) | ORAL | Status: DC

## 2012-07-29 NOTE — Progress Notes (Signed)
Nature conservation officer at [facility] [address] [place] [address] Phone: [phone] Fax: [phone]  Date:  07/29/2012   Name:  Isabel Foley   DOB:  29-Jul-1970   MRN:  [id number] Gender: female Age: 42 y.o.  Primary Physician:  Kerby Nora, MD  Evaluating MD: Hannah Beat, MD  Chief Complaint: Sinusitis   History of Present Illness:  Isabel Foley is a 42 y.o. very pleasant female patient who presents with the following:  42 yo and pain on the right side. Leaving for [place] -  Has had some congestion starting with laying down.  Mostly pain behind her eye and in the R maxillary region. Some ear congestion.   No n/v/d. AF  Past Medical History, Surgical History, Social History, Family History, Problem List, Medications, and Allergies have been reviewed and updated if relevant.  Current Outpatient Prescriptions on File Prior to Visit  Medication Sig Dispense Refill  . montelukast (SINGULAIR) 10 MG tablet Take 10 mg by mouth at bedtime.        . Multiple Vitamin (MULTIVITAMIN) tablet Take 1 tablet by mouth daily.      . naproxen (NAPROSYN) 500 MG tablet Take 1 tablet (500 mg total) by mouth 3 (three) times daily with meals.  90 tablet  0  . NON FORMULARY Take 2 oz by mouth daily. Vemma Nutritional Supplement      . Phenylephrine-Ibuprofen (ADVIL CONGESTION RELIEF PO) Take by mouth as needed.      . Probiotic Product (PROBIOTIC FORMULA PO) Take 1 tablet by mouth daily.       No current facility-administered medications on file prior to visit.    Review of Systems: ROS: GEN: Acute illness details above GI: Tolerating PO intake GU: maintaining adequate hydration and urination Pulm: No SOB Interactive and getting along well at home.  Otherwise, ROS is as per the HPI.   Physical Examination: BP 120/78  Pulse 79  Temp(Src) 98.5 F (36.9 C) (Oral)  Ht 5' 1.5" (1.562 m)  Wt 133 lb (60.328 kg)  BMI 24.73 kg/m2  SpO2 96%   Gen: WDWN, NAD; alert,appropriate  and cooperative throughout exam  HEENT: Normocephalic and atraumatic. Throat clear, w/o exudate, no LAD, R TM clear, L TM - good landmarks, No fluid present. rhinnorhea.  Left frontal and maxillary sinuses: non-Tender Right frontal and maxillary sinuses: Tendermax  Neck: No ant or post LAD CV: RRR, No M/G/R Pulm: Breathing comfortably in no resp distress. no w/c/r Abd: S,NT,ND,+BS Extr: no c/c/e Psych: full affect, pleasant   Assessment and Plan:  Acute maxillary sinusitis Acute sinusitis: ABX as below.   Reviewed symptomatic care as well as ABX in this case.   Orders Today:  No orders of the defined types were placed in this encounter.    Updated Medication List: (Includes new medications, updates to list, dose adjustments) Meds ordered this encounter  Medications  . mometasone (NASONEX) 50 MCG/ACT nasal spray    Sig: Place 2 sprays into the nose daily.  Marland Kitchen amoxicillin-clavulanate (AUGMENTIN) 875-125 MG per tablet    Sig: Take 1 tablet by mouth 2 (two) times daily.    Dispense:  20 tablet    Refill:  0  . fluconazole (DIFLUCAN) 150 MG tablet    Sig: Take 1 tablet (150 mg total) by mouth once.    Dispense:  1 tablet    Refill:  0    Medications Discontinued: Medications Discontinued During This Encounter  Medication Reason  . ketoprofen (ORUDIS) 75  MG capsule Error  . levofloxacin (LEVAQUIN) 500 MG tablet Error      Signed, Celestina Gironda T. Kalany Diekmann, MD 07/29/2012 3:52 PM

## 2012-07-30 ENCOUNTER — Encounter: Payer: Self-pay | Admitting: Family Medicine

## 2012-09-22 ENCOUNTER — Ambulatory Visit: Payer: Self-pay | Admitting: Unknown Physician Specialty

## 2012-12-07 ENCOUNTER — Telehealth: Payer: Self-pay

## 2012-12-07 NOTE — Telephone Encounter (Signed)
Left foot pain on outside of foot for 2 months; pt not sure if injury or not ? If pulled something. Pt is going to try to schedule appt with podiatrist and if needs referral will cb for appt with Dr Ermalene Searing.

## 2012-12-24 ENCOUNTER — Ambulatory Visit (INDEPENDENT_AMBULATORY_CARE_PROVIDER_SITE_OTHER): Payer: BC Managed Care – PPO | Admitting: Podiatrist

## 2012-12-24 ENCOUNTER — Ambulatory Visit (INDEPENDENT_AMBULATORY_CARE_PROVIDER_SITE_OTHER): Payer: BC Managed Care – PPO

## 2012-12-24 ENCOUNTER — Encounter: Payer: Self-pay | Admitting: Podiatrist

## 2012-12-24 VITALS — BP 111/86 | HR 79 | Resp 16 | Ht 62.0 in | Wt 128.0 lb

## 2012-12-24 DIAGNOSIS — M659 Synovitis and tenosynovitis, unspecified: Secondary | ICD-10-CM

## 2012-12-24 DIAGNOSIS — M79609 Pain in unspecified limb: Secondary | ICD-10-CM

## 2012-12-24 DIAGNOSIS — M79672 Pain in left foot: Secondary | ICD-10-CM

## 2012-12-24 DIAGNOSIS — M7750 Other enthesopathy of unspecified foot: Secondary | ICD-10-CM

## 2012-12-24 MED ORDER — DICLOFENAC SODIUM 1 % TD GEL: 2.0000 g | Freq: Four times a day (QID) | TRANSDERMAL | Status: DC

## 2012-12-24 NOTE — Patient Instructions (Signed)
Tendinitis Tendinitis is swelling and inflammation of the tendons. Tendons are band-like tissues that connect muscle to bone. CAUSES Tendinitis is usually caused by overusing the tendon, muscles, and joints involved. When the tissue surrounding a tendon (synovium) becomes inflamed, it is called tenosynovitis. Tendinitis commonly develops in people whose jobs require repetitive motions. SYMPTOMS  Pain.  Tenderness.  Mild swelling. DIAGNOSIS Tendinitis is usually diagnosed by physical exam. Your caregiver may also order X-rays or other imaging tests. TREATMENT Your caregiver may recommend certain medicines or exercises for your treatment. HOME CARE INSTRUCTIONS   Use a brace or splint for as long as directed by your caregiver until the pain decreases.  Put ice on the injured area.  Put ice in a plastic bag.  Place a towel between your skin and the bag.  Leave the ice on for 15-20 minutes, 03-04 times a day.  Avoid using the limb while the tendon is painful. Perform gentle range of motion exercises only as directed by your caregiver. Stop exercises if pain or discomfort increase, unless directed otherwise by your caregiver.  Only take over-the-counter or prescription medicines for pain, discomfort, or fever as directed by your caregiver. SEEK MEDICAL CARE IF:   Your pain and swelling increase.  You develop new, unexplained symptoms, especially increased numbness in the hands. MAKE SURE YOU:   Understand these instructions.  Will watch your condition.  Will get help right away if you are not doing well or get worse. Document Released: 12/22/1999 Document Revised: 03/18/2011 Document Reviewed: 03/12/2010 [facility] Patient Information 2014 [place], [place]. Raynaud's Syndrome Raynaud's Syndrome is a disorder of the blood vessels in your hands and feet. It occurs when small arteries of the arms/hands or legs/feet become sensitive to cold or emotional upset. This causes the arteries  to constrict, or narrow, and reduces blood flow to the area. The color in the fingers or toes changes from white to bluish to red and this is not usually painful. There may be numbness and tingling. Sores on the skin (ulcers) can form. Symptoms are usually relieved by warming. HOME CARE INSTRUCTIONS   Avoid exposure to cold. Keep your whole body warm and dry. Dress in layers. Wear mittens or gloves when handling ice or frozen food and when outdoors. Use holders for glasses or cans containing cold drinks. If possible, stay indoors during cold weather.  Limit your use of caffeine. Switch to decaffeinated coffee, tea, and soda pop. Avoid chocolate.  Avoid smoking or being around cigarette smoke. Smoke will make symptoms worse.  Wear loose fitting socks and comfortable, roomy shoes.  Avoid vibrating tools and machinery.  If possible, avoid stressful and emotional situations. Exercise, meditation and yoga may help you cope with stress. Biofeedback may be useful.  Ask your caregiver about medicine (calcium channel blockers) that may control Raynaud's phenomena. SEEK MEDICAL CARE IF:   Your discomfort becomes worse, despite conservative treatment.  You develop sores on your fingers and toes that do not heal. Document Released: 12/22/1999 Document Revised: 03/18/2011 Document Reviewed: 12/29/2007 [facility] Patient Information 2014 [place], [place].

## 2012-12-24 NOTE — Progress Notes (Signed)
MRN: [id number] Name: Isabel Foley  Sex: female Age: 42 y.o. DOB: October 25, 1970  Provider: Marlowe Aschoff P  Allergies: Review of patient's allergies indicates no known allergies.   Chief Complaint  Patient presents with  . Foot Pain    MY LEFT FOOT HURTS     HPI: Patient is 42 y.o. female who presents today for chief complaint of left foot pain along the lateral aspect of the left foot. She states she got a new workout DVD about 2-3 months ago and she performed a DVD without shoes in her home. She states this may have been a time where she injured her foot and since then she continues to have some discomfort at the end of the day in the evenings. No pain with first step in the morning noted. Secondary concerns are that peeling appearance of her left great toenail, a cyst on the medial aspect of the right hallux, and a ridge on the fifth digit nail left her husband massage is her foot at the end of the evenings with helps she also takes prescription strength Naprosyn for the pain. Past Medical History  Diagnosis Date  . Allergic rhinitis, cause unspecified   . Depressive disorder, not elsewhere classified   . Dizziness and giddiness   . Other malaise and fatigue   . Esophageal reflux   . Nontoxic multinodular goiter   . Pure hypercholesterolemia   . Premenstrual tension syndromes   . Other chronic sinusitis   . Peptic ulcer, unspecified site, unspecified as acute or chronic, without mention of hemorrhage, perforation, or obstruction   . Abdominal pain, right upper quadrant   . Pain in limb   . Unspecified hypothyroidism        Medication List       This list is accurate as of: 12/24/12  1:45 PM.  Always use your most recent med list.               ADVIL CONGESTION RELIEF PO  Take by mouth as needed.     amoxicillin-clavulanate 875-125 MG per tablet  Commonly known as:  AUGMENTIN  Take 1 tablet by mouth 2 (two) times daily.     diclofenac sodium 1 % Gel  Commonly known  as:  VOLTAREN  Apply 2 g topically 4 (four) times daily. Rub into affected area of foot 2 to 4 times daily     fluconazole 150 MG tablet  Commonly known as:  DIFLUCAN  Take 1 tablet (150 mg total) by mouth once.     mometasone 50 MCG/ACT nasal spray  Commonly known as:  NASONEX  Place 2 sprays into the nose daily.     montelukast 10 MG tablet  Commonly known as:  SINGULAIR  Take 10 mg by mouth at bedtime.     multivitamin tablet  Take 1 tablet by mouth daily.     naproxen 500 MG tablet  Commonly known as:  NAPROSYN  Take 1 tablet (500 mg total) by mouth 3 (three) times daily with meals.     NON FORMULARY  Take 2 oz by mouth daily. Vemma Nutritional Supplement     PROBIOTIC FORMULA PO  Take 1 tablet by mouth daily.         Past Surgical History  Procedure Laterality Date  . Dilation and curettage of uterus  2002  . Leep  2000       Review of Systems  DATA OBTAINED: from patient intake form GENERAL: Feels well no fevers, no fatigue,  no changes in appetite SKIN: No itching, no rashes, no open lesions, no wounds EYES: No eye pain,no redness, no discharge EARS: No earache,no ringing of ears, no recent change in hearing NOSE: No congestion, no drainage, no bleeding  MOUTH/THROAT: No mouth pain, No sore throat, No difficulty chewing or swallowing  RESPIRATORY: No cough, no wheezing, no SOB CARDIAC: No chest pain,no heart palpitations,no new onset lower extremity edema  GI: No abdominal pain, No Nausea, no vomiting, no diarrhea, no heartburn or no reflux  GU: No dysuria, no increased frequency or urgency MUSCULOSKELETAL: No unrelieved bone/joint pain,  NEUROLOGIC: Awake, alert, appropriate to situation, No change in mental status. PSYCHIATRIC: No overt anxiety or sadness.No behavior issue.  AMBULATION:  Ambulates unassisted  Filed Vitals:   12/24/12 0935  BP: 111/86  Pulse: 79  Resp: 16    Physical Exam  GENERAL APPEARANCE: Alert, conversant. Appropriately  groomed. No acute distress.  VASCULAR: Pedal pulses palpable and strong bilateral.  Capillary refill time is immediate to all digits,  Proximal to distal cooling it warm to warm.  Digital hair growth is present bilateral  NEUROLOGIC: sensation is intact epicritically and protectively to 5.07 monofilament at 5/5 sites bilateral.  Light touch is intact bilateral, vibratory sensation intact bilateral, achilles tendon reflex is intact bilateral.  MUSCULOSKELETAL: acceptable muscle strength, tone and stability bilateral. Pain along the fifth metatarsal left is noted. No pinpoint area of discomfort is present. Some discomfort along the peroneus brevis tendon is also noted left. No pain with tuning fork noted.  DERMATOLOGIC: Small cystlike papule is noted on the medial aspect of the right great toe. It is pearly in nature and is approximately 3 mm in diameter. Not painful or symptomatic. She also has a very small ridge along the fifth digital nail left. There also appears to be a small cyst within the route of the nail which is causing a ridge.  Assessment  Tendinitis peroneus brevis tendon left, cyst x2   Plan X-rays reviewed and reveal no fracture. Recommended a ankle brace to stabilize the left foot. Prescription for Voltaren gel for her to its origin to the foot. Instructed her continued use of anti-inflammatory. Discussed if there is no improvement in symptoms within 2-3 weeks I will put her in a boot she will call if she would like to get the boot. Also discussed that we can remove the cystic lesions on both feet however she states that they're not bothering her and for now she'll leave them alone. She'll be seen back as needed for followup.  Marlowe Aschoff DPM     EGERTON, KATHRYN P, DPM

## 2013-01-04 ENCOUNTER — Ambulatory Visit (INDEPENDENT_AMBULATORY_CARE_PROVIDER_SITE_OTHER): Payer: BC Managed Care – PPO | Admitting: Internal Medicine

## 2013-01-04 ENCOUNTER — Encounter: Payer: Self-pay | Admitting: Internal Medicine

## 2013-01-04 VITALS — BP 110/66 | HR 80 | Temp 98.5°F | Ht 62.5 in | Wt 131.5 lb

## 2013-01-04 DIAGNOSIS — J019 Acute sinusitis, unspecified: Secondary | ICD-10-CM

## 2013-01-04 DIAGNOSIS — B9689 Other specified bacterial agents as the cause of diseases classified elsewhere: Secondary | ICD-10-CM

## 2013-01-04 MED ORDER — AMOXICILLIN-POT CLAVULANATE 875-125 MG PO TABS: 1.0000 | ORAL_TABLET | Freq: Two times a day (BID) | ORAL | Status: DC

## 2013-01-04 NOTE — Patient Instructions (Signed)

## 2013-01-04 NOTE — Progress Notes (Signed)
Subjective:    Patient ID: [name], female    DOB: 1970-12-11, 42 y.o.   MRN: [id number]  HPI Having a cough for 3-4 weeks Sinus migraine for a few days before Christmas--on the right side Went away last week for a few days--then noted increased sneezing Fever after trip--- broke with motrin  Cough is productive of a little clear phlegm Chills and sweat with the fever (but tends to chill easily) Some SOB---at least with activity Lots of nasal congestion Some ear pain intermittently Cough is worse at night  Using mucinex--severe formula--- may help a little  Current Outpatient Prescriptions on File Prior to Visit  Medication Sig Dispense Refill  . diclofenac sodium (VOLTAREN) 1 % GEL Apply 2 g topically 4 (four) times daily. Rub into affected area of foot 2 to 4 times daily  100 g  2  . fluconazole (DIFLUCAN) 150 MG tablet Take 1 tablet (150 mg total) by mouth once.  1 tablet  0  . mometasone (NASONEX) 50 MCG/ACT nasal spray Place 2 sprays into the nose daily.      . montelukast (SINGULAIR) 10 MG tablet Take 10 mg by mouth at bedtime.        . Multiple Vitamin (MULTIVITAMIN) tablet Take 1 tablet by mouth daily.      . naproxen (NAPROSYN) 500 MG tablet Take 1 tablet (500 mg total) by mouth 3 (three) times daily with meals.  90 tablet  0  . NON FORMULARY Take 2 oz by mouth daily. Vemma Nutritional Supplement      . Phenylephrine-Ibuprofen (ADVIL CONGESTION RELIEF PO) Take by mouth as needed.      . Probiotic Product (PROBIOTIC FORMULA PO) Take 1 tablet by mouth daily.       No current facility-administered medications on file prior to visit.    No Known Allergies  Past Medical History  Diagnosis Date  . Allergic rhinitis, cause unspecified   . Depressive disorder, not elsewhere classified   . Dizziness and giddiness   . Other malaise and fatigue   . Esophageal reflux   . Nontoxic multinodular goiter   . Pure hypercholesterolemia   . Premenstrual tension syndromes   .  Other chronic sinusitis   . Peptic ulcer, unspecified site, unspecified as acute or chronic, without mention of hemorrhage, perforation, or obstruction   . Abdominal pain, right upper quadrant   . Pain in limb   . Unspecified hypothyroidism     Past Surgical History  Procedure Laterality Date  . Dilation and curettage of uterus  2002  . Leep  2000    Family History  Problem Relation Age of Onset  . Hypertension Father   . Hypothyroidism Father   . Atrial fibrillation Brother 20  . Other Sister     Collagen Vascular Disease    History   Social History  . Marital Status: Married    Spouse Name: N/A    Number of Children: 7  . Years of Education: N/A   Occupational History  . Stay at home mom    Social History Main Topics  . Smoking status: Never Smoker   . Smokeless tobacco: Never Used  . Alcohol Use: No  . Drug Use: No  . Sexual Activity: Not on file   Other Topics Concern  . Not on file   Social History Narrative   7 children; 6 biologic.Marland KitchenMarland KitchenADHD      Married; previously divorced      Stay at home mom  Regular exercise-yes, daily aerobics, treadmill      Diet: Skips meals, limited veggies; ice cream in AM   Review of Systems No rash No vomiting or diarrhea Appetite still off some     Objective:   Physical Exam  Constitutional: She appears well-developed and well-nourished. No distress.  HENT:  Mild maxillary sinus tenderness Moderate nasal inflammation TMs normal Slight pharyngeal injection without exudate  Neck: Normal range of motion. Neck supple. No thyromegaly present.  Pulmonary/Chest: Effort normal and breath sounds normal. No respiratory distress. She has no wheezes. She has no rales.  Lymphadenopathy:    She has no cervical adenopathy.  Skin: No rash noted.          Assessment & Plan:

## 2013-01-04 NOTE — Assessment & Plan Note (Signed)
Has been having cough for a month or more ?sinusitis vs bronchitis Will give repeat course of augmentin as it worked last time and she tolerated it

## 2013-01-04 NOTE — Progress Notes (Signed)
Pre-visit discussion using our clinic review tool. No additional management support is needed unless otherwise documented below in the visit note.  

## 2013-02-03 ENCOUNTER — Telehealth: Payer: Self-pay | Admitting: *Deleted

## 2013-02-03 NOTE — Telephone Encounter (Signed)
Called left message asking pt to call me back regarding voltaren gel. Gel has been denied by insurance can possibly use [facility] for cream.

## 2013-02-17 ENCOUNTER — Other Ambulatory Visit: Payer: Self-pay | Admitting: Family Medicine

## 2013-02-17 NOTE — Telephone Encounter (Signed)
Last office visit 01/04/2013 with Dr. Silvio Pate.  Not on current medication list.  Ok to refill?

## 2013-02-18 NOTE — Telephone Encounter (Signed)
Spoke with Isabel Foley.  She states Dr. Diona Browner prescribed flexeril for her to use with her migraines.  States she has had this bottle forever but just recently had a slipped disc and was using is for that also.  States she has been seeing a chiropractor for the slip disc but is now completely out of her flexeril which she likes to keep on hand for her migraines.  Advised I would let Dr. Diona Browner know this information and see if she is willing to refill her medication but I also informed her she is overdue for a CPX with Dr. Diona Browner.  Ok to refill?

## 2013-02-18 NOTE — Telephone Encounter (Signed)
Call pt.Isabel Foley for med refill/ cpx if does not see GYN, what is she using this med for?

## 2013-02-18 NOTE — Telephone Encounter (Signed)
Left message for Kim to return my call

## 2013-12-10 ENCOUNTER — Telehealth: Payer: Self-pay

## 2013-12-10 NOTE — Telephone Encounter (Signed)
She should try Delsym or Nyquil. Nothing can be called in until she has been seen

## 2013-12-10 NOTE — Telephone Encounter (Signed)
Pt left v/m; pt has appt on 12/13/13; pt not resting at night due to cough and pt request med called to [place]. Pt has taken Mucinex PM that is not helping cough. Pt request cb. Unable to reach pt by phone to get more info.

## 2013-12-10 NOTE — Telephone Encounter (Signed)
Left detailed msg on VM per HIPAA  

## 2013-12-13 ENCOUNTER — Ambulatory Visit (INDEPENDENT_AMBULATORY_CARE_PROVIDER_SITE_OTHER): Payer: 59 | Admitting: Internal Medicine

## 2013-12-13 ENCOUNTER — Encounter: Payer: Self-pay | Admitting: Internal Medicine

## 2013-12-13 VITALS — BP 110/76 | HR 74 | Temp 99.0°F | Wt 141.0 lb

## 2013-12-13 DIAGNOSIS — T3695XA Adverse effect of unspecified systemic antibiotic, initial encounter: Secondary | ICD-10-CM

## 2013-12-13 DIAGNOSIS — J0100 Acute maxillary sinusitis, unspecified: Secondary | ICD-10-CM

## 2013-12-13 DIAGNOSIS — B379 Candidiasis, unspecified: Secondary | ICD-10-CM

## 2013-12-13 MED ORDER — AMOXICILLIN-POT CLAVULANATE 875-125 MG PO TABS: 1.0000 | ORAL_TABLET | Freq: Two times a day (BID) | ORAL | Status: DC

## 2013-12-13 MED ORDER — HYDROCODONE-HOMATROPINE 5-1.5 MG/5ML PO SYRP: 5.0000 mL | ORAL_SOLUTION | Freq: Three times a day (TID) | ORAL | Status: DC | PRN

## 2013-12-13 MED ORDER — FLUCONAZOLE 150 MG PO TABS: 150.0000 mg | ORAL_TABLET | Freq: Once | ORAL | Status: DC

## 2013-12-13 NOTE — Patient Instructions (Signed)

## 2013-12-13 NOTE — Progress Notes (Addendum)
HPI  Pt presents to the clinic today with c/o headache, facial pain and pressure, and nasal congestion. She reports this started 6 weeks ago. She has been blowing blood tinged yellow mucous from her nose. She denies fever, but has intermittently had chills and body aches.  She has tried Mucinex, Advil cold and sinus, Singulair and Allegra with minimal relief. She does have a history of seasonal allergies. She has had sick contacts. She does not smoke.  Review of Systems    Past Medical History  Diagnosis Date  . Allergic rhinitis, cause unspecified   . Depressive disorder, not elsewhere classified   . Dizziness and giddiness   . Other malaise and fatigue   . Esophageal reflux   . Nontoxic multinodular goiter   . Pure hypercholesterolemia   . Premenstrual tension syndromes   . Other chronic sinusitis   . Peptic ulcer, unspecified site, unspecified as acute or chronic, without mention of hemorrhage, perforation, or obstruction   . Abdominal pain, right upper quadrant   . Pain in limb   . Unspecified hypothyroidism     Family History  Problem Relation Age of Onset  . Hypertension Father   . Hypothyroidism Father   . Atrial fibrillation Brother 20  . Other Sister     Collagen Vascular Disease    History   Social History  . Marital Status: Married    Spouse Name: N/A    Number of Children: 7  . Years of Education: N/A   Occupational History  . Stay at home mom    Social History Main Topics  . Smoking status: Never Smoker   . Smokeless tobacco: Never Used  . Alcohol Use: No  . Drug Use: No  . Sexual Activity: Not on file   Other Topics Concern  . Not on file   Social History Narrative   7 children; 6 biologic.Marland KitchenMarland KitchenADHD      Married; previously divorced      Stay at home mom      Regular exercise-yes, daily aerobics, treadmill      Diet: Skips meals, limited veggies; ice cream in AM    No Known Allergies   Constitutional: Positive headache, fatigue. Denies  fever or abrupt weight changes.  HEENT:  Positive  facial pain, nasal congestion and sore throat. Denies eye redness, ear pain, ringing in the ears, wax buildup, runny nose or bloody nose. Respiratory: Pt reports cough. Denies difficulty breathing or shortness of breath.  Cardiovascular: Denies chest pain, chest tightness, palpitations or swelling in the hands or feet.   No other specific complaints in a complete review of systems (except as listed in HPI above).  Objective:   BP 110/76 mmHg  Pulse 74  Temp(Src) 99 F (37.2 C) (Oral)  Wt 141 lb (63.957 kg)  SpO2 99%  General: Appears her stated age, well developed, well nourished in NAD. HEENT: Head: normal shape and size, maxillary sinus tenderness noted; Ears: Tm's gray and intact, normal light reflex; Nose: mucosa pink and moist, septum midline; Throat/Mouth: + PND. Teeth present, mucosa erythematous and moist, no exudate noted, no lesions or ulcerations noted.  Cardiovascular: Normal rate and rhythm. S1,S2 noted.  No murmur, rubs or gallops noted. Pulmonary/Chest: Normal effort and positive vesicular breath sounds. No respiratory distress. No wheezes, rales or ronchi noted.      Assessment & Plan:   Acute maxillary sinusitis  Can use a Neti Pot which can be purchased from your local drug store. Continue singulair, allegra and  nasonex Augmentin BID for 10 days Diflucan for antibiotic induced yeast infection Hycodan for cough  RTC as needed or if symptoms persist.

## 2013-12-13 NOTE — Progress Notes (Signed)
Pre visit review using our clinic review tool, if applicable. No additional management support is needed unless otherwise documented below in the visit note. 

## 2014-08-12 ENCOUNTER — Encounter (INDEPENDENT_AMBULATORY_CARE_PROVIDER_SITE_OTHER): Payer: Self-pay

## 2014-08-12 ENCOUNTER — Encounter: Payer: Self-pay | Admitting: Internal Medicine

## 2014-08-12 ENCOUNTER — Ambulatory Visit (INDEPENDENT_AMBULATORY_CARE_PROVIDER_SITE_OTHER): Payer: BLUE CROSS/BLUE SHIELD | Admitting: Internal Medicine

## 2014-08-12 VITALS — BP 118/74 | HR 71 | Temp 98.8°F | Wt 143.0 lb

## 2014-08-12 DIAGNOSIS — W540XXA Bitten by dog, initial encounter: Secondary | ICD-10-CM | POA: Diagnosis not present

## 2014-08-12 DIAGNOSIS — S61258A Open bite of other finger without damage to nail, initial encounter: Secondary | ICD-10-CM

## 2014-08-12 DIAGNOSIS — Z23 Encounter for immunization: Secondary | ICD-10-CM

## 2014-08-12 NOTE — Progress Notes (Signed)
Subjective:    Patient ID: Isabel Foley, female    DOB: [birth year], 44 y.o.   MRN: [id number]  HPI  Pt presents to the clinic today with c/o a dog bite to her right index finger. This occurred 3 days ago. It was her dog who is UTD on all vaccines. She has noticed some tenderness in her finger tip. She denies redness, warmth, or drainage. She did wash it with soap and water and has been putting antibiotic ointment on it. Her main concern is that she is not sure the last time she had a tetanus shot and she would like to get on today.  Review of Systems      Past Medical History  Diagnosis Date  . Allergic rhinitis, cause unspecified   . Depressive disorder, not elsewhere classified   . Dizziness and giddiness   . Other malaise and fatigue   . Esophageal reflux   . Nontoxic multinodular goiter   . Pure hypercholesterolemia   . Premenstrual tension syndromes   . Other chronic sinusitis   . Peptic ulcer, unspecified site, unspecified as acute or chronic, without mention of hemorrhage, perforation, or obstruction   . Abdominal pain, right upper quadrant   . Pain in limb   . Unspecified hypothyroidism     Current Outpatient Prescriptions  Medication Sig Dispense Refill  . cetirizine (ZYRTEC) 10 MG tablet Take 10 mg by mouth daily.    . cyclobenzaprine (FLEXERIL) 10 MG tablet TAKE 1 TABLET BY MOUTH DAILY AS NEEDED FOR MIGRAINE 30 tablet 0  . mometasone (NASONEX) 50 MCG/ACT nasal spray Place 2 sprays into the nose daily.    . montelukast (SINGULAIR) 10 MG tablet Take 10 mg by mouth at bedtime.      . Multiple Vitamin (MULTIVITAMIN) tablet Take 1 tablet by mouth daily.    . naproxen (NAPROSYN) 500 MG tablet Take 1 tablet (500 mg total) by mouth 3 (three) times daily with meals. 90 tablet 0  . NON FORMULARY Take 2 oz by mouth daily. Vemma Nutritional Supplement    . Phenylephrine-Ibuprofen (ADVIL CONGESTION RELIEF PO) Take by mouth as needed.    . Probiotic Product (PROBIOTIC FORMULA  PO) Take 1 tablet by mouth daily.     No current facility-administered medications for this visit.    No Known Allergies  Family History  Problem Relation Age of Onset  . Hypertension Father   . Hypothyroidism Father   . Atrial fibrillation Brother 20  . Other Sister     Collagen Vascular Disease    History   Social History  . Marital Status: Married    Spouse Name: N/A  . Number of Children: 7  . Years of Education: N/A   Occupational History  . Stay at home mom    Social History Main Topics  . Smoking status: Never Smoker   . Smokeless tobacco: Never Used  . Alcohol Use: No  . Drug Use: No  . Sexual Activity: Not on file   Other Topics Concern  . Not on file   Social History Narrative   7 children; 6 biologic.Marland KitchenMarland KitchenADHD      Married; previously divorced      Stay at home mom      Regular exercise-yes, daily aerobics, treadmill      Diet: Skips meals, limited veggies; ice cream in AM     Constitutional: Denies fever, malaise, fatigue, headache or abrupt weight changes.  Respiratory: Denies difficulty breathing, shortness of breath, cough or sputum  production.   Cardiovascular: Denies chest pain, chest tightness, palpitations or swelling in the hands or feet.  Skin: Pt reports dog bite of right index finger. Denies redness, rashes, lesions or ulcercations.    No other specific complaints in a complete review of systems (except as listed in HPI above).  Objective:   Physical Exam  BP 118/74 mmHg  Pulse 71  Temp(Src) 98.8 F (37.1 C) (Oral)  Wt 143 lb (64.864 kg)  SpO2 98%  Wt Readings from Last 3 Encounters:  08/12/14 143 lb (64.864 kg)  12/13/13 141 lb (63.957 kg)  01/04/13 131 lb 8 oz (59.648 kg)    General: Appears her stated age, well developed, well nourished in NAD. Skin: Warm, dry and intact. Small puncture to right index finger. Mild tenderness with palpation. No redness or warmth noted. Cardiovascular: Normal rate and rhythm. S1,S2  noted.  No murmur, rubs or gallops noted. Pulmonary/Chest: Normal effort and positive vesicular breath sounds. No respiratory distress. No wheezes, rales or ronchi noted.    BMET    Component Value Date/Time   NA 140 10/11/2009 1022   K 4.3 10/11/2009 1022   CL 107 10/11/2009 1022   CO2 29 10/11/2009 1022   GLUCOSE 86 10/11/2009 1022   BUN 12 10/11/2009 1022   CREATININE 0.9 10/11/2009 1022   CALCIUM 8.9 10/11/2009 1022   GFRNONAA 78.18 10/11/2009 1022    Lipid Panel  No results found for: CHOL, TRIG, HDL, CHOLHDL, VLDL, LDLCALC  CBC    Component Value Date/Time   WBC 6.8 10/11/2009 1022   RBC 3.87 10/11/2009 1022   HGB 12.4 10/11/2009 1022   HCT 36.4 10/11/2009 1022   PLT 234.0 10/11/2009 1022   MCV 94.0 10/11/2009 1022   MCHC 34.0 10/11/2009 1022   RDW 13.4 10/11/2009 1022   LYMPHSABS 1.5 10/11/2009 1022   MONOABS 0.7 10/11/2009 1022   EOSABS 0.1 10/11/2009 1022   BASOSABS 0.0 10/11/2009 1022    Hgb A1C No results found for: HGBA1C       Assessment & Plan:   Dog bite of right index finger:  It is so small and so superficial that I do not think abx are indicated Will update her tetanus today Continue to wash with soap and water Continue antibiotic ointment Return precastions given  RTC as needed

## 2014-08-12 NOTE — Progress Notes (Signed)
Pre visit review using our clinic review tool, if applicable. No additional management support is needed unless otherwise documented below in the visit note. 

## 2014-08-12 NOTE — Patient Instructions (Signed)

## 2014-11-22 ENCOUNTER — Encounter: Payer: Self-pay | Admitting: Family Medicine

## 2014-11-22 ENCOUNTER — Encounter (INDEPENDENT_AMBULATORY_CARE_PROVIDER_SITE_OTHER): Payer: Self-pay

## 2014-11-22 ENCOUNTER — Ambulatory Visit (INDEPENDENT_AMBULATORY_CARE_PROVIDER_SITE_OTHER): Payer: BLUE CROSS/BLUE SHIELD | Admitting: Family Medicine

## 2014-11-22 VITALS — BP 118/84 | HR 89 | Temp 99.5°F | Ht 61.75 in | Wt 146.5 lb

## 2014-11-22 DIAGNOSIS — J302 Other seasonal allergic rhinitis: Secondary | ICD-10-CM | POA: Diagnosis not present

## 2014-11-22 DIAGNOSIS — G43009 Migraine without aura, not intractable, without status migrainosus: Secondary | ICD-10-CM

## 2014-11-22 DIAGNOSIS — J019 Acute sinusitis, unspecified: Secondary | ICD-10-CM

## 2014-11-22 DIAGNOSIS — H9193 Unspecified hearing loss, bilateral: Secondary | ICD-10-CM | POA: Diagnosis not present

## 2014-11-22 DIAGNOSIS — B9689 Other specified bacterial agents as the cause of diseases classified elsewhere: Secondary | ICD-10-CM

## 2014-11-22 DIAGNOSIS — F418 Other specified anxiety disorders: Secondary | ICD-10-CM

## 2014-11-22 MED ORDER — FLUCONAZOLE 150 MG PO TABS: 150.0000 mg | ORAL_TABLET | Freq: Once | ORAL | Status: DC

## 2014-11-22 MED ORDER — ALPRAZOLAM 0.25 MG PO TABS: ORAL_TABLET | ORAL | Status: DC

## 2014-11-22 MED ORDER — CYCLOBENZAPRINE HCL 10 MG PO TABS: 10.0000 mg | ORAL_TABLET | Freq: Every day | ORAL | Status: DC

## 2014-11-22 MED ORDER — RIZATRIPTAN BENZOATE 10 MG PO TABS: 10.0000 mg | ORAL_TABLET | ORAL | Status: DC | PRN

## 2014-11-22 MED ORDER — NAPROXEN 500 MG PO TABS: 500.0000 mg | ORAL_TABLET | Freq: Three times a day (TID) | ORAL | Status: DC

## 2014-11-22 MED ORDER — AMOXICILLIN 500 MG PO CAPS: 1000.0000 mg | ORAL_CAPSULE | Freq: Two times a day (BID) | ORAL | Status: DC

## 2014-11-22 NOTE — Assessment & Plan Note (Signed)
Treat with antibiotics given length of symptoms and pt history.

## 2014-11-22 NOTE — Assessment & Plan Note (Signed)
Poor acute control. Will refill cyclobenzaprine and naprosyn but will have pt try trial of maxalt.  Push water, don't skip meals, rest.  At follow up in 1-2 months will discuss possible need for prophylactic.

## 2014-11-22 NOTE — Patient Instructions (Addendum)
Complete course of antibiotics. Start nasal saline spray 3-4 times daily. Start mucinex twice daily. Restart nasal steroid spray 2 sprays per nostril.  Can try maxalt for migraine acute.  Schedule 30 min OV in 1-2 months for multiple issues.

## 2014-11-22 NOTE — Progress Notes (Signed)
Pre visit review using our clinic review tool, if applicable. No additional management support is needed unless otherwise documented below in the visit note. 

## 2014-11-22 NOTE — Assessment & Plan Note (Signed)
Will treat with temporary low dos eof alprazolam while on upcoming car trip. If not improving or spreading to other locations.. Consider alternate treatment. Follow up in 1-2 months for multiple concerns.

## 2014-11-22 NOTE — Assessment & Plan Note (Signed)
Likely dud in part to fluid in ears .Marland Kitchen Will perform hearing test at follow up if not improved. May be due to past scarring from perforation.

## 2014-11-22 NOTE — Progress Notes (Signed)
   Subjective:    Patient ID: Isabel Foley, female    DOB: 1970/10/21, 44 y.o.   MRN: [id number]  HPI   44 year old female presents with multiple issues.  1. Migraine, needs refill of her flexeril. She has about 2 per month, last 72 hours.  Allergy meds have helped, worse with travel ( not fluids, stress) and with allergies. Takes cyclobenzaprine at night when has a migraine and 500 mg of naproxen , but still waking with migraine.  Also using peppermint oil. She tried sumatriptan in  past.. Gave SE.  2. Sinus congestion, sinus pain and pressure in last 1-2 weeks. Facial pressure, green mucus with blood in last few days. Subjective fever Has been using advil, decongestant. Worsening.  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 Planning on traveling next week.  4. Anxiety on long trips in car. Not around town.  5. Bladder spasm and urinary frequency, especially on long trips. Has been on Detrol in past. 6. : joint pain       Review of Systems  Constitutional: Negative for fever and fatigue.  HENT: Positive for ear pain, postnasal drip, sinus pressure and sneezing. Negative for sore throat.   Eyes: Negative for pain.  Respiratory: Negative for chest tightness and shortness of breath.   Cardiovascular: Negative for chest pain, palpitations and leg swelling.  Gastrointestinal: Negative for abdominal pain.  Genitourinary: Negative for dysuria.       Objective:   Physical Exam  Constitutional: Vital signs are normal. She appears well-developed and well-nourished. She is cooperative.  Non-toxic appearance. She does not appear ill. No distress.  HENT:  Head: Normocephalic.  Right Ear: Hearing, external ear and ear canal normal. Tympanic membrane is not erythematous, not retracted and not bulging. A middle ear effusion is present.  Left Ear: Hearing, external ear and ear canal normal. Tympanic membrane is not erythematous, not retracted  and not bulging. A middle ear effusion is present.  Nose: Mucosal edema and rhinorrhea present. Right sinus exhibits maxillary sinus tenderness. Right sinus exhibits no frontal sinus tenderness. Left sinus exhibits maxillary sinus tenderness. Left sinus exhibits no frontal sinus tenderness.  Mouth/Throat: Uvula is midline, oropharynx is clear and moist and mucous membranes are normal.  Eyes: Conjunctivae, EOM and lids are normal. Pupils are equal, round, and reactive to light. Lids are everted and swept, no foreign bodies found.  Neck: Trachea normal and normal range of motion. Neck supple. Carotid bruit is not present. No thyroid mass and no thyromegaly present.  Cardiovascular: Normal rate, regular rhythm, S1 normal, S2 normal, normal heart sounds, intact distal pulses and normal pulses.  Exam reveals no gallop and no friction rub.   No murmur heard. Pulmonary/Chest: Effort normal and breath sounds normal. No tachypnea. No respiratory distress. She has no decreased breath sounds. She has no wheezes. She has no rhonchi. She has no rales.  Neurological: She is alert.  Skin: Skin is warm, dry and intact. No rash noted.  Psychiatric: Her speech is normal and behavior is normal. Judgment normal. Her mood appears not anxious. Cognition and memory are normal. She does not exhibit a depressed mood.          Assessment & Plan:

## 2014-11-22 NOTE — Assessment & Plan Note (Signed)
Add nasal saline irrigation and nasal steroid spray given eustachian tube dysfunction.

## 2014-12-16 ENCOUNTER — Ambulatory Visit: Payer: BLUE CROSS/BLUE SHIELD | Admitting: Family Medicine

## 2014-12-19 ENCOUNTER — Other Ambulatory Visit: Payer: Self-pay | Admitting: Family Medicine

## 2014-12-19 NOTE — Telephone Encounter (Signed)
Last office visit 11/22/2014.  Last refilled 11/22/2014 for #30 with no refills.  Ok to refill?

## 2014-12-21 ENCOUNTER — Ambulatory Visit: Payer: BLUE CROSS/BLUE SHIELD | Admitting: Family Medicine

## 2014-12-27 ENCOUNTER — Encounter: Payer: Self-pay | Admitting: Family Medicine

## 2014-12-27 ENCOUNTER — Ambulatory Visit (INDEPENDENT_AMBULATORY_CARE_PROVIDER_SITE_OTHER): Payer: BLUE CROSS/BLUE SHIELD | Admitting: Family Medicine

## 2014-12-27 VITALS — BP 120/86 | HR 78 | Temp 99.1°F | Ht 61.25 in | Wt 147.5 lb

## 2014-12-27 DIAGNOSIS — R350 Frequency of micturition: Secondary | ICD-10-CM | POA: Diagnosis not present

## 2014-12-27 DIAGNOSIS — J302 Other seasonal allergic rhinitis: Secondary | ICD-10-CM | POA: Diagnosis not present

## 2014-12-27 DIAGNOSIS — G43009 Migraine without aura, not intractable, without status migrainosus: Secondary | ICD-10-CM

## 2014-12-27 DIAGNOSIS — H9193 Unspecified hearing loss, bilateral: Secondary | ICD-10-CM | POA: Diagnosis not present

## 2014-12-27 MED ORDER — TROSPIUM CHLORIDE ER 60 MG PO CP24: ORAL_CAPSULE | ORAL | Status: DC

## 2014-12-27 MED ORDER — TOPIRAMATE 25 MG PO TABS: 25.0000 mg | ORAL_TABLET | Freq: Every evening | ORAL | Status: DC | PRN

## 2014-12-27 NOTE — Assessment & Plan Note (Signed)
No clear fluid behind  TMs today. Will refer to audiology or ENT if needed.

## 2014-12-27 NOTE — Assessment & Plan Note (Signed)
Start trial of sanctura to find med to improve symptoms with less central SE.

## 2014-12-27 NOTE — Progress Notes (Signed)
Pre visit review using our clinic review tool, if applicable. No additional management support is needed unless otherwise documented below in the visit note. 

## 2014-12-27 NOTE — Progress Notes (Signed)
   Subjective:    Patient ID: Isabel Foley, female    DOB: 1970-06-23, 44 y.o.   MRN: [id number]  HPI  44 year old female presents  For 1-2 month follow up with multiple complaints.  1. Migraine,common: Acute treatment with cyclobenzaprine and naprosyn. Tried trial of maxalt. Helped with migraine. She has had migraine with stress of mother in [place] death. Only 2 in the last month.  2. Hearing loss and eustachian tube dysfunction. S/P nasal saline and steroid . Fullness in ears gone.  She feels she has had hearing loss for years, increased ringing.  She has not had a hearing eval.  HX of bilateral rupture.  3. Urinary frequency dx with urge incontinence: Was on Detrol in past, helped some. SE of dry mouth.  4.  She has noted weight gain in last few years.  Exercise: 3-5 days a week. Diet: moderate diet, she is very picky eater. Trying to eat small portion. Wt Readings from Last 3 Encounters:  12/27/14 147 lb 8 oz (66.906 kg)  11/22/14 146 lb 8 oz (66.452 kg)  08/12/14 143 lb (64.864 kg)       Review of Systems  Constitutional: Negative for fever and fatigue.  HENT: Negative for ear pain.   Eyes: Negative for pain.  Respiratory: Negative for chest tightness and shortness of breath.   Cardiovascular: Negative for chest pain, palpitations and leg swelling.  Gastrointestinal: Negative for abdominal pain.  Genitourinary: Negative for dysuria.       Objective:   Physical Exam  Constitutional: Vital signs are normal. She appears well-developed and well-nourished. She is cooperative.  Non-toxic appearance. She does not appear ill. No distress.  HENT:  Head: Normocephalic.  Right Ear: Hearing, tympanic membrane, external ear and ear canal normal. Tympanic membrane is not erythematous, not retracted and not bulging.  Left Ear: Hearing, tympanic membrane, external ear and ear canal normal. Tympanic membrane is not erythematous, not retracted and not bulging.  Nose: No mucosal  edema or rhinorrhea. Right sinus exhibits no maxillary sinus tenderness and no frontal sinus tenderness. Left sinus exhibits no maxillary sinus tenderness and no frontal sinus tenderness.  Mouth/Throat: Uvula is midline, oropharynx is clear and moist and mucous membranes are normal.  Eyes: Conjunctivae, EOM and lids are normal. Pupils are equal, round, and reactive to light. Lids are everted and swept, no foreign bodies found.  Neck: Trachea normal and normal range of motion. Neck supple. Carotid bruit is not present. No thyroid mass and no thyromegaly present.  Cardiovascular: Normal rate, regular rhythm, S1 normal, S2 normal, normal heart sounds, intact distal pulses and normal pulses.  Exam reveals no gallop and no friction rub.   No murmur heard. Pulmonary/Chest: Effort normal and breath sounds normal. No tachypnea. No respiratory distress. She has no decreased breath sounds. She has no wheezes. She has no rhonchi. She has no rales.  Abdominal: Soft. Normal appearance and bowel sounds are normal. There is no tenderness.  Neurological: She is alert.  Skin: Skin is warm, dry and intact. No rash noted.  Psychiatric: Her speech is normal and behavior is normal. Judgment and thought content normal. Her mood appears not anxious. Cognition and memory are normal. She does not exhibit a depressed mood.          Assessment & Plan:

## 2014-12-27 NOTE — Patient Instructions (Addendum)
Call if need a referral for audiologist/ ENT for tinnitus. Use flonase 2 sprays per nostril daily.  Start trial of sanctura 60 mg daily.  Work on low [employer], get back to regular exercise.  Start trial of Topamax 25 mg  for migraine prevention and weight loss.

## 2014-12-27 NOTE — Assessment & Plan Note (Signed)
Stress reduction, healthy lifestyle. Start topiramate for weight loss and migraine prevention. Follow up at [facility] in 03/3014 . May need to increase over the phone in 1 month if no benifit

## 2014-12-27 NOTE — Assessment & Plan Note (Addendum)
Continue flonase. Sees allergist.

## 2015-01-03 ENCOUNTER — Encounter: Payer: Self-pay | Admitting: Primary Care

## 2015-01-03 ENCOUNTER — Ambulatory Visit (INDEPENDENT_AMBULATORY_CARE_PROVIDER_SITE_OTHER): Payer: BLUE CROSS/BLUE SHIELD | Admitting: Primary Care

## 2015-01-03 VITALS — BP 120/80 | HR 93 | Temp 97.8°F | Wt 150.0 lb

## 2015-01-03 DIAGNOSIS — B379 Candidiasis, unspecified: Secondary | ICD-10-CM | POA: Diagnosis not present

## 2015-01-03 DIAGNOSIS — N390 Urinary tract infection, site not specified: Secondary | ICD-10-CM | POA: Diagnosis not present

## 2015-01-03 DIAGNOSIS — T3695XA Adverse effect of unspecified systemic antibiotic, initial encounter: Secondary | ICD-10-CM

## 2015-01-03 LAB — POCT URINALYSIS DIPSTICK
Bilirubin, UA: NEGATIVE
Blood, UA: NEGATIVE
GLUCOSE UA: NEGATIVE
Ketones, UA: NEGATIVE
Nitrite, UA: NEGATIVE
Protein, UA: NEGATIVE
SPEC GRAV UA: 1.01
UROBILINOGEN UA: NEGATIVE
pH, UA: 6

## 2015-01-03 MED ORDER — SULFAMETHOXAZOLE-TRIMETHOPRIM 800-160 MG PO TABS: 1.0000 | ORAL_TABLET | Freq: Two times a day (BID) | ORAL | Status: DC

## 2015-01-03 MED ORDER — FLUCONAZOLE 150 MG PO TABS: 150.0000 mg | ORAL_TABLET | Freq: Once | ORAL | Status: DC

## 2015-01-03 MED ORDER — PHENAZOPYRIDINE HCL 200 MG PO TABS: 200.0000 mg | ORAL_TABLET | Freq: Three times a day (TID) | ORAL | Status: DC | PRN

## 2015-01-03 NOTE — Progress Notes (Signed)
Subjective:    Patient ID: Isabel Foley, female    DOB: Dec 13, 1970, 45 y.o.   MRN: [id number]  HPI  Isabel Foley is a 44 year old female who presents today with a chief complaint of cloudy urine. She also reports low grade fever, left flank pain, urinary frequency, dysuria. Her symptoms began 1 week ago. She tested her urine twice with the drug store sticks which came back positive for leukocytes. Denies hematuria, vaginal discharge. She's been taking AZO, increasing her intake of fluids, and taking probiotics with temporary improvement.   Review of Systems  Constitutional: Positive for fever.  Genitourinary: Positive for dysuria, frequency and flank pain. Negative for hematuria and vaginal discharge.       Past Medical History  Diagnosis Date  . Allergic rhinitis, cause unspecified   . Depressive disorder, not elsewhere classified   . Dizziness and giddiness   . Other malaise and fatigue   . Esophageal reflux   . Nontoxic multinodular goiter   . Pure hypercholesterolemia   . Premenstrual tension syndromes   . Other chronic sinusitis   . Peptic ulcer, unspecified site, unspecified as acute or chronic, without mention of hemorrhage, perforation, or obstruction   . Abdominal pain, right upper quadrant   . Pain in limb   . Unspecified hypothyroidism     Social History   Social History  . Marital Status: Married    Spouse Name: N/A  . Number of Children: 7  . Years of Education: N/A   Occupational History  . Stay at home mom    Social History Main Topics  . Smoking status: Never Smoker   . Smokeless tobacco: Never Used  . Alcohol Use: No  . Drug Use: No  . Sexual Activity: Not on file   Other Topics Concern  . Not on file   Social History Narrative   7 children; 6 biologic.Marland KitchenMarland KitchenADHD      Married; previously divorced      Stay at home mom      Regular exercise-yes, daily aerobics, treadmill      Diet: Skips meals, limited veggies; ice cream in AM    Past  Surgical History  Procedure Laterality Date  . Dilation and curettage of uterus  2002  . Leep  2000    Family History  Problem Relation Age of Onset  . Hypertension Father   . Hypothyroidism Father   . Atrial fibrillation Brother 20  . Other Sister     Collagen Vascular Disease    No Known Allergies  Current Outpatient Prescriptions on File Prior to Visit  Medication Sig Dispense Refill  . ALPRAZolam (XANAX) 0.25 MG tablet Can use  1 tab daily  as needed for anxiety on long trips. 20 tablet 0  . cetirizine (ZYRTEC) 10 MG tablet Take 10 mg by mouth daily.    . cyclobenzaprine (FLEXERIL) 10 MG tablet TAKE 1 TABLET (10 MG TOTAL) BY MOUTH AT BEDTIME. 30 tablet 0  . mometasone (NASONEX) 50 MCG/ACT nasal spray Place 2 sprays into the nose daily.    . montelukast (SINGULAIR) 10 MG tablet Take 10 mg by mouth at bedtime.      . Multiple Vitamin (MULTIVITAMIN) tablet Take 1 tablet by mouth daily.    . naproxen (NAPROSYN) 500 MG tablet Take 1 tablet (500 mg total) by mouth 3 (three) times daily with meals. 90 tablet 0  . Probiotic Product (PROBIOTIC FORMULA PO) Take 1 tablet by mouth daily.    . rizatriptan (  MAXALT) 10 MG tablet Take 1 tablet (10 mg total) by mouth as needed for migraine. May repeat in 2 hours if needed 10 tablet 0  . topiramate (TOPAMAX) 25 MG tablet Take 1 tablet (25 mg total) by mouth at bedtime as needed. 30 tablet 3  . Trospium Chloride 60 MG CP24 1 tab po daily 30 each 3   No current facility-administered medications on file prior to visit.    BP 120/80 mmHg  Pulse 93  Temp(Src) 97.8 F (36.6 C) (Tympanic)  Wt 150 lb (68.04 kg)  SpO2 99%  LMP 12/16/2014    Objective:   Physical Exam  Constitutional: She appears well-nourished.  Cardiovascular: Normal rate and regular rhythm.   Pulmonary/Chest: Effort normal and breath sounds normal.  Abdominal:  Mild CVA tenderness to left flank  Skin: Skin is warm and dry.          Assessment & Plan:   Dysuria:  Cloudy urine, dysuria, urinary frequency x 1 week.  Temporary improvement with OTC treatment and pushing water intake. UA: Positive for small mount of leuks, negative for nitrites, blood. Due to symptoms and presence of bacterial will treat empirically. Culture sent. Start Bactrim DS tablets. RX for pyridium sent to pharmacy. Fluids, rest. Follow up PRN

## 2015-01-03 NOTE — Progress Notes (Signed)
Pre visit review using our clinic review tool, if applicable. No additional management support is needed unless otherwise documented below in the visit note. 

## 2015-01-03 NOTE — Patient Instructions (Signed)
Start bactrim antibiotics for UTI. Take 1 tablet by mouth twice daily for 5 days.  You may take the pyridium three times daily as needed for pain/discomfort.   It was a pleasure meeting you!  Urinary Tract Infection Urinary tract infections (UTIs) can develop anywhere along your urinary tract. Your urinary tract is your body's drainage system for removing wastes and extra water. Your urinary tract includes two kidneys, two ureters, a bladder, and a urethra. Your kidneys are a pair of bean-shaped organs. Each kidney is about the size of your fist. They are located below your ribs, one on each side of your spine. CAUSES Infections are caused by microbes, which are microscopic organisms, including fungi, viruses, and bacteria. These organisms are so small that they can only be seen through a microscope. Bacteria are the microbes that most commonly cause UTIs. SYMPTOMS  Symptoms of UTIs may vary by age and gender of the patient and by the location of the infection. Symptoms in young women typically include a frequent and intense urge to urinate and a painful, burning feeling in the bladder or urethra during urination. Older women and men are more likely to be tired, shaky, and weak and have muscle aches and abdominal pain. A fever may mean the infection is in your kidneys. Other symptoms of a kidney infection include pain in your back or sides below the ribs, nausea, and vomiting. DIAGNOSIS To diagnose a UTI, your caregiver will ask you about your symptoms. Your caregiver will also ask you to provide a urine sample. The urine sample will be tested for bacteria and white blood cells. White blood cells are made by your body to help fight infection. TREATMENT  Typically, UTIs can be treated with medication. Because most UTIs are caused by a bacterial infection, they usually can be treated with the use of antibiotics. The choice of antibiotic and length of treatment depend on your symptoms and the type of  bacteria causing your infection. HOME CARE INSTRUCTIONS  If you were prescribed antibiotics, take them exactly as your caregiver instructs you. Finish the medication even if you feel better after you have only taken some of the medication.  Drink enough water and fluids to keep your urine clear or pale yellow.  Avoid caffeine, tea, and carbonated beverages. They tend to irritate your bladder.  Empty your bladder often. Avoid holding urine for long periods of time.  Empty your bladder before and after sexual intercourse.  After a bowel movement, women should cleanse from front to back. Use each tissue only once. SEEK MEDICAL CARE IF:   You have back pain.  You develop a fever.  Your symptoms do not begin to resolve within 3 days. SEEK IMMEDIATE MEDICAL CARE IF:   You have severe back pain or lower abdominal pain.  You develop chills.  You have nausea or vomiting.  You have continued burning or discomfort with urination. MAKE SURE YOU:   Understand these instructions.  Will watch your condition.  Will get help right away if you are not doing well or get worse.   This information is not intended to replace advice given to you by your health care provider. Make sure you discuss any questions you have with your health care provider.   Document Released: 10/03/2004 Document Revised: 09/14/2014 Document Reviewed: 02/01/2011 Elsevier Interactive Patient Education [employer].

## 2015-01-04 LAB — URINE CULTURE
COLONY COUNT: NO GROWTH
ORGANISM ID, BACTERIA: NO GROWTH

## 2015-01-24 ENCOUNTER — Encounter: Payer: Self-pay | Admitting: Obstetrics & Gynecology

## 2015-01-24 ENCOUNTER — Ambulatory Visit (INDEPENDENT_AMBULATORY_CARE_PROVIDER_SITE_OTHER): Payer: BLUE CROSS/BLUE SHIELD | Admitting: Obstetrics & Gynecology

## 2015-01-24 VITALS — BP 130/87 | HR 105 | Ht 62.0 in | Wt 151.0 lb

## 2015-01-24 DIAGNOSIS — Z1151 Encounter for screening for human papillomavirus (HPV): Secondary | ICD-10-CM

## 2015-01-24 DIAGNOSIS — Z1239 Encounter for other screening for malignant neoplasm of breast: Secondary | ICD-10-CM | POA: Diagnosis not present

## 2015-01-24 DIAGNOSIS — Z01419 Encounter for gynecological examination (general) (routine) without abnormal findings: Secondary | ICD-10-CM | POA: Diagnosis not present

## 2015-01-24 DIAGNOSIS — Z124 Encounter for screening for malignant neoplasm of cervix: Secondary | ICD-10-CM

## 2015-01-24 DIAGNOSIS — N920 Excessive and frequent menstruation with regular cycle: Secondary | ICD-10-CM

## 2015-01-24 MED ORDER — TRANEXAMIC ACID 650 MG PO TABS: 1300.0000 mg | ORAL_TABLET | Freq: Three times a day (TID) | ORAL | Status: DC

## 2015-01-24 NOTE — Patient Instructions (Signed)
Preventive Care for Adults, Female A healthy lifestyle and preventive care can promote health and wellness. Preventive health guidelines for women include the following key practices.  A routine yearly physical is a good way to check with your health care provider about your health and preventive screening. It is a chance to share any concerns and updates on your health and to receive a thorough exam.  Visit your dentist for a routine exam and preventive care every 6 months. Brush your teeth twice a day and floss once a day. Good oral hygiene prevents tooth decay and gum disease.  The frequency of eye exams is based on your age, health, family medical history, use of contact lenses, and other factors. Follow your health care provider's recommendations for frequency of eye exams.  Eat a healthy diet. Foods like vegetables, fruits, whole grains, low-fat dairy products, and lean protein foods contain the nutrients you need without too many calories. Decrease your intake of foods high in solid fats, added sugars, and salt. Eat the right amount of calories for you.Get information about a proper diet from your health care provider, if necessary.  Regular physical exercise is one of the most important things you can do for your health. Most adults should get at least 150 minutes of moderate-intensity exercise (any activity that increases your heart rate and causes you to sweat) each week. In addition, most adults need muscle-strengthening exercises on 2 or more days a week.  Maintain a healthy weight. The body mass index (BMI) is a screening tool to identify possible weight problems. It provides an estimate of body fat based on height and weight. Your health care provider can find your BMI and can help you achieve or maintain a healthy weight.For adults 20 years and older:  A BMI below 18.5 is considered underweight.  A BMI of 18.5 to 24.9 is normal.  A BMI of 25 to 29.9 is considered overweight.  A  BMI of 30 and above is considered obese.  Maintain normal blood lipids and cholesterol levels by exercising and minimizing your intake of saturated fat. Eat a balanced diet with plenty of fruit and vegetables. Blood tests for lipids and cholesterol should begin at age 45 and be repeated every 5 years. If your lipid or cholesterol levels are high, you are over 50, or you are at high risk for heart disease, you may need your cholesterol levels checked more frequently.Ongoing high lipid and cholesterol levels should be treated with medicines if diet and exercise are not working.  If you smoke, find out from your health care provider how to quit. If you do not use tobacco, do not start.  Lung cancer screening is recommended for adults aged 45-80 years who are at high risk for developing lung cancer because of a history of smoking. A yearly low-dose CT scan of the lungs is recommended for people who have at least a 30-pack-year history of smoking and are a current smoker or have quit within the past 15 years. A pack year of smoking is smoking an average of 1 pack of cigarettes a day for 1 year (for example: 1 pack a day for 30 years or 2 packs a day for 15 years). Yearly screening should continue until the smoker has stopped smoking for at least 15 years. Yearly screening should be stopped for people who develop a health problem that would prevent them from having lung cancer treatment.  If you are pregnant, do not drink alcohol. If you are  breastfeeding, be very cautious about drinking alcohol. If you are not pregnant and choose to drink alcohol, do not have more than 1 drink per day. One drink is considered to be 12 ounces (355 mL) of beer, 5 ounces (148 mL) of wine, or 1.5 ounces (44 mL) of liquor.  Avoid use of street drugs. Do not share needles with anyone. Ask for help if you need support or instructions about stopping the use of drugs.  High blood pressure causes heart disease and increases the risk  of stroke. Your blood pressure should be checked at least every 1 to 2 years. Ongoing high blood pressure should be treated with medicines if weight loss and exercise do not work.  If you are 55-79 years old, ask your health care provider if you should take aspirin to prevent strokes.  Diabetes screening is done by taking a blood sample to check your blood glucose level after you have not eaten for a certain period of time (fasting). If you are not overweight and you do not have risk factors for diabetes, you should be screened once every 3 years starting at age 45. If you are overweight or obese and you are 40-70 years of age, you should be screened for diabetes every year as part of your cardiovascular risk assessment.  Breast cancer screening is essential preventive care for women. You should practice "breast self-awareness." This means understanding the normal appearance and feel of your breasts and may include breast self-examination. Any changes detected, no matter how small, should be reported to a health care provider. Women in their 20s and 30s should have a clinical breast exam (CBE) by a health care provider as part of a regular health exam every 1 to 3 years. After age 40, women should have a CBE every year. Starting at age 40, women should consider having a mammogram (breast X-ray test) every year. Women who have a family history of breast cancer should talk to their health care provider about genetic screening. Women at a high risk of breast cancer should talk to their health care providers about having an MRI and a mammogram every year.  Breast cancer gene (BRCA)-related cancer risk assessment is recommended for women who have family members with BRCA-related cancers. BRCA-related cancers include breast, ovarian, tubal, and peritoneal cancers. Having family members with these cancers may be associated with an increased risk for harmful changes (mutations) in the breast cancer genes BRCA1 and  BRCA2. Results of the assessment will determine the need for genetic counseling and BRCA1 and BRCA2 testing.  Your health care provider may recommend that you be screened regularly for cancer of the pelvic organs (ovaries, uterus, and vagina). This screening involves a pelvic examination, including checking for microscopic changes to the surface of your cervix (Pap test). You may be encouraged to have this screening done every 3 years, beginning at age 21.  For women ages 30-65, health care providers may recommend pelvic exams and Pap testing every 3 years, or they may recommend the Pap and pelvic exam, combined with testing for human papilloma virus (HPV), every 5 years. Some types of HPV increase your risk of cervical cancer. Testing for HPV may also be done on women of any age with unclear Pap test results.  Other health care providers may not recommend any screening for nonpregnant women who are considered low risk for pelvic cancer and who do not have symptoms. Ask your health care provider if a screening pelvic exam is right for   you.  If you have had past treatment for cervical cancer or a condition that could lead to cancer, you need Pap tests and screening for cancer for at least 20 years after your treatment. If Pap tests have been discontinued, your risk factors (such as having a new sexual partner) need to be reassessed to determine if screening should resume. Some women have medical problems that increase the chance of getting cervical cancer. In these cases, your health care provider may recommend more frequent screening and Pap tests.  Colorectal cancer can be detected and often prevented. Most routine colorectal cancer screening begins at the age of 50 years and continues through age 75 years. However, your health care provider may recommend screening at an earlier age if you have risk factors for colon cancer. On a yearly basis, your health care provider may provide home test kits to check  for hidden blood in the stool. Use of a small camera at the end of a tube, to directly examine the colon (sigmoidoscopy or colonoscopy), can detect the earliest forms of colorectal cancer. Talk to your health care provider about this at age 50, when routine screening begins. Direct exam of the colon should be repeated every 5-10 years through age 75 years, unless early forms of precancerous polyps or small growths are found.  People who are at an increased risk for hepatitis B should be screened for this virus. You are considered at high risk for hepatitis B if:  You were born in a country where hepatitis B occurs often. Talk with your health care provider about which countries are considered high risk.  Your parents were born in a high-risk country and you have not received a shot to protect against hepatitis B (hepatitis B vaccine).  You have HIV or AIDS.  You use needles to inject street drugs.  You live with, or have sex with, someone who has hepatitis B.  You get hemodialysis treatment.  You take certain medicines for conditions like cancer, organ transplantation, and autoimmune conditions.  Hepatitis C blood testing is recommended for all people born from 1945 through 1965 and any individual with known risks for hepatitis C.  Practice safe sex. Use condoms and avoid high-risk sexual practices to reduce the spread of sexually transmitted infections (STIs). STIs include gonorrhea, chlamydia, syphilis, trichomonas, herpes, HPV, and human immunodeficiency virus (HIV). Herpes, HIV, and HPV are viral illnesses that have no cure. They can result in disability, cancer, and death.  You should be screened for sexually transmitted illnesses (STIs) including gonorrhea and chlamydia if:  You are sexually active and are younger than 24 years.  You are older than 24 years and your health care provider tells you that you are at risk for this type of infection.  Your sexual activity has changed  since you were last screened and you are at an increased risk for chlamydia or gonorrhea. Ask your health care provider if you are at risk.  If you are at risk of being infected with HIV, it is recommended that you take a prescription medicine daily to prevent HIV infection. This is called preexposure prophylaxis (PrEP). You are considered at risk if:  You are sexually active and do not regularly use condoms or know the HIV status of your partner(s).  You take drugs by injection.  You are sexually active with a partner who has HIV.  Talk with your health care provider about whether you are at high risk of being infected with HIV. If   you choose to begin PrEP, you should first be tested for HIV. You should then be tested every 3 months for as long as you are taking PrEP.  Osteoporosis is a disease in which the bones lose minerals and strength with aging. This can result in serious bone fractures or breaks. The risk of osteoporosis can be identified using a bone density scan. Women ages 67 years and over and women at risk for fractures or osteoporosis should discuss screening with their health care providers. Ask your health care provider whether you should take a calcium supplement or vitamin D to reduce the rate of osteoporosis.  Menopause can be associated with physical symptoms and risks. Hormone replacement therapy is available to decrease symptoms and risks. You should talk to your health care provider about whether hormone replacement therapy is right for you.  Use sunscreen. Apply sunscreen liberally and repeatedly throughout the day. You should seek shade when your shadow is shorter than you. Protect yourself by wearing long sleeves, pants, a wide-brimmed hat, and sunglasses year round, whenever you are outdoors.  Once a month, do a whole body skin exam, using a mirror to look at the skin on your back. Tell your health care provider of new moles, moles that have irregular borders, moles that  are larger than a pencil eraser, or moles that have changed in shape or color.  Stay current with required vaccines (immunizations).  Influenza vaccine. All adults should be immunized every year.  Tetanus, diphtheria, and acellular pertussis (Td, Tdap) vaccine. Pregnant women should receive 1 dose of Tdap vaccine during each pregnancy. The dose should be obtained regardless of the length of time since the last dose. Immunization is preferred during the 27th-36th week of gestation. An adult who has not previously received Tdap or who does not know her vaccine status should receive 1 dose of Tdap. This initial dose should be followed by tetanus and diphtheria toxoids (Td) booster doses every 10 years. Adults with an unknown or incomplete history of completing a 3-dose immunization series with Td-containing vaccines should begin or complete a primary immunization series including a Tdap dose. Adults should receive a Td booster every 10 years.  Varicella vaccine. An adult without evidence of immunity to varicella should receive 2 doses or a second dose if she has previously received 1 dose. Pregnant females who do not have evidence of immunity should receive the first dose after pregnancy. This first dose should be obtained before leaving the health care facility. The second dose should be obtained 4-8 weeks after the first dose.  Human papillomavirus (HPV) vaccine. Females aged 13-26 years who have not received the vaccine previously should obtain the 3-dose series. The vaccine is not recommended for use in pregnant females. However, pregnancy testing is not needed before receiving a dose. If a female is found to be pregnant after receiving a dose, no treatment is needed. In that case, the remaining doses should be delayed until after the pregnancy. Immunization is recommended for any person with an immunocompromised condition through the age of 61 years if she did not get any or all doses earlier. During the  3-dose series, the second dose should be obtained 4-8 weeks after the first dose. The third dose should be obtained 24 weeks after the first dose and 16 weeks after the second dose.  Zoster vaccine. One dose is recommended for adults aged 30 years or older unless certain conditions are present.  Measles, mumps, and rubella (MMR) vaccine. Adults born  before 1957 generally are considered immune to measles and mumps. Adults born in 1957 or later should have 1 or more doses of MMR vaccine unless there is a contraindication to the vaccine or there is laboratory evidence of immunity to each of the three diseases. A routine second dose of MMR vaccine should be obtained at least 28 days after the first dose for students attending postsecondary schools, health care workers, or international travelers. People who received inactivated measles vaccine or an unknown type of measles vaccine during 1963-1967 should receive 2 doses of MMR vaccine. People who received inactivated mumps vaccine or an unknown type of mumps vaccine before 1979 and are at high risk for mumps infection should consider immunization with 2 doses of MMR vaccine. For females of childbearing age, rubella immunity should be determined. If there is no evidence of immunity, females who are not pregnant should be vaccinated. If there is no evidence of immunity, females who are pregnant should delay immunization until after pregnancy. Unvaccinated health care workers born before 1957 who lack laboratory evidence of measles, mumps, or rubella immunity or laboratory confirmation of disease should consider measles and mumps immunization with 2 doses of MMR vaccine or rubella immunization with 1 dose of MMR vaccine.  Pneumococcal 13-valent conjugate (PCV13) vaccine. When indicated, a person who is uncertain of his immunization history and has no record of immunization should receive the PCV13 vaccine. All adults 65 years of age and older should receive this  vaccine. An adult aged 19 years or older who has certain medical conditions and has not been previously immunized should receive 1 dose of PCV13 vaccine. This PCV13 should be followed with a dose of pneumococcal polysaccharide (PPSV23) vaccine. Adults who are at high risk for pneumococcal disease should obtain the PPSV23 vaccine at least 8 weeks after the dose of PCV13 vaccine. Adults older than 44 years of age who have normal immune system function should obtain the PPSV23 vaccine dose at least 1 year after the dose of PCV13 vaccine.  Pneumococcal polysaccharide (PPSV23) vaccine. When PCV13 is also indicated, PCV13 should be obtained first. All adults aged 65 years and older should be immunized. An adult younger than age 65 years who has certain medical conditions should be immunized. Any person who resides in a nursing home or long-term care facility should be immunized. An adult smoker should be immunized. People with an immunocompromised condition and certain other conditions should receive both PCV13 and PPSV23 vaccines. People with human immunodeficiency virus (HIV) infection should be immunized as soon as possible after diagnosis. Immunization during chemotherapy or radiation therapy should be avoided. Routine use of PPSV23 vaccine is not recommended for American Indians, [place] Natives, or people younger than 65 years unless there are medical conditions that require PPSV23 vaccine. When indicated, people who have unknown immunization and have no record of immunization should receive PPSV23 vaccine. One-time revaccination 5 years after the first dose of PPSV23 is recommended for people aged 19-64 years who have chronic kidney failure, nephrotic syndrome, asplenia, or immunocompromised conditions. People who received 1-2 doses of PPSV23 before age 65 years should receive another dose of PPSV23 vaccine at age 65 years or later if at least 5 years have passed since the previous dose. Doses of PPSV23 are not  needed for people immunized with PPSV23 at or after age 65 years.  Meningococcal vaccine. Adults with asplenia or persistent complement component deficiencies should receive 2 doses of quadrivalent meningococcal conjugate (MenACWY-D) vaccine. The doses should be obtained   at least 2 months apart. Microbiologists working with certain meningococcal bacteria, [place] recruits, people at risk during an outbreak, and people who travel to or live in countries with a high rate of meningitis should be immunized. A first-year college student up through age 34 years who is living in a residence hall should receive a dose if she did not receive a dose on or after her 16th birthday. Adults who have certain high-risk conditions should receive one or more doses of vaccine.  Hepatitis A vaccine. Adults who wish to be protected from this disease, have certain high-risk conditions, work with hepatitis A-infected animals, work in hepatitis A research labs, or travel to or work in countries with a high rate of hepatitis A should be immunized. Adults who were previously unvaccinated and who anticipate close contact with an international adoptee during the first 60 days after arrival in the [place] from a country with a high rate of hepatitis A should be immunized.  Hepatitis B vaccine. Adults who wish to be protected from this disease, have certain high-risk conditions, may be exposed to blood or other infectious body fluids, are household contacts or sex partners of hepatitis B positive people, are clients or workers in certain care facilities, or travel to or work in countries with a high rate of hepatitis B should be immunized.  Haemophilus influenzae type b (Hib) vaccine. A previously unvaccinated person with asplenia or sickle cell disease or having a scheduled splenectomy should receive 1 dose of Hib vaccine. Regardless of previous immunization, a recipient of a hematopoietic stem cell transplant should receive a  3-dose series 6-12 months after her successful transplant. Hib vaccine is not recommended for adults with HIV infection. Preventive Services / Frequency Ages 35 to 4 years  Blood pressure check.** / Every 3-5 years.  Lipid and cholesterol check.** / Every 5 years beginning at age 60.  Clinical breast exam.** / Every 3 years for women in their 71s and 10s.  BRCA-related cancer risk assessment.** / For women who have family members with a BRCA-related cancer (breast, ovarian, tubal, or peritoneal cancers).  Pap test.** / Every 2 years from ages 76 through 26. Every 3 years starting at age 61 through age 76 or 93 with a history of 3 consecutive normal Pap tests.  HPV screening.** / Every 3 years from ages 37 through ages 60 to 51 with a history of 3 consecutive normal Pap tests.  Hepatitis C blood test.** / For any individual with known risks for hepatitis C.  Skin self-exam. / Monthly.  Influenza vaccine. / Every year.  Tetanus, diphtheria, and acellular pertussis (Tdap, Td) vaccine.** / Consult your health care provider. Pregnant women should receive 1 dose of Tdap vaccine during each pregnancy. 1 dose of Td every 10 years.  Varicella vaccine.** / Consult your health care provider. Pregnant females who do not have evidence of immunity should receive the first dose after pregnancy.  HPV vaccine. / 3 doses over 6 months, if 93 and younger. The vaccine is not recommended for use in pregnant females. However, pregnancy testing is not needed before receiving a dose.  Measles, mumps, rubella (MMR) vaccine.** / You need at least 1 dose of MMR if you were born in 1957 or later. You may also need a 2nd dose. For females of childbearing age, rubella immunity should be determined. If there is no evidence of immunity, females who are not pregnant should be vaccinated. If there is no evidence of immunity, females who are  pregnant should delay immunization until after pregnancy.  Pneumococcal  13-valent conjugate (PCV13) vaccine.** / Consult your health care provider.  Pneumococcal polysaccharide (PPSV23) vaccine.** / 1 to 2 doses if you smoke cigarettes or if you have certain conditions.  Meningococcal vaccine.** / 1 dose if you are age 68 to 8 years and a Market researcher living in a residence hall, or have one of several medical conditions, you need to get vaccinated against meningococcal disease. You may also need additional booster doses.  Hepatitis A vaccine.** / Consult your health care provider.  Hepatitis B vaccine.** / Consult your health care provider.  Haemophilus influenzae type b (Hib) vaccine.** / Consult your health care provider. Ages 7 to 53 years  Blood pressure check.** / Every year.  Lipid and cholesterol check.** / Every 5 years beginning at age 25 years.  Lung cancer screening. / Every year if you are aged 11-80 years and have a 30-pack-year history of smoking and currently smoke or have quit within the past 15 years. Yearly screening is stopped once you have quit smoking for at least 15 years or develop a health problem that would prevent you from having lung cancer treatment.  Clinical breast exam.** / Every year after age 48 years.  BRCA-related cancer risk assessment.** / For women who have family members with a BRCA-related cancer (breast, ovarian, tubal, or peritoneal cancers).  Mammogram.** / Every year beginning at age 41 years and continuing for as long as you are in good health. Consult with your health care provider.  Pap test.** / Every 3 years starting at age 65 years through age 37 or 70 years with a history of 3 consecutive normal Pap tests.  HPV screening.** / Every 3 years from ages 72 years through ages 60 to 40 years with a history of 3 consecutive normal Pap tests.  Fecal occult blood test (FOBT) of stool. / Every year beginning at age 21 years and continuing until age 5 years. You may not need to do this test if you get  a colonoscopy every 10 years.  Flexible sigmoidoscopy or colonoscopy.** / Every 5 years for a flexible sigmoidoscopy or every 10 years for a colonoscopy beginning at age 35 years and continuing until age 48 years.  Hepatitis C blood test.** / For all people born from [birth year] through 1965 and any individual with known risks for hepatitis C.  Skin self-exam. / Monthly.  Influenza vaccine. / Every year.  Tetanus, diphtheria, and acellular pertussis (Tdap/Td) vaccine.** / Consult your health care provider. Pregnant women should receive 1 dose of Tdap vaccine during each pregnancy. 1 dose of Td every 10 years.  Varicella vaccine.** / Consult your health care provider. Pregnant females who do not have evidence of immunity should receive the first dose after pregnancy.  Zoster vaccine.** / 1 dose for adults aged 30 years or older.  Measles, mumps, rubella (MMR) vaccine.** / You need at least 1 dose of MMR if you were born in 1957 or later. You may also need a second dose. For females of childbearing age, rubella immunity should be determined. If there is no evidence of immunity, females who are not pregnant should be vaccinated. If there is no evidence of immunity, females who are pregnant should delay immunization until after pregnancy.  Pneumococcal 13-valent conjugate (PCV13) vaccine.** / Consult your health care provider.  Pneumococcal polysaccharide (PPSV23) vaccine.** / 1 to 2 doses if you smoke cigarettes or if you have certain conditions.  Meningococcal vaccine.** /  Consult your health care provider.  Hepatitis A vaccine.** / Consult your health care provider.  Hepatitis B vaccine.** / Consult your health care provider.  Haemophilus influenzae type b (Hib) vaccine.** / Consult your health care provider. Ages 64 years and over  Blood pressure check.** / Every year.  Lipid and cholesterol check.** / Every 5 years beginning at age 23 years.  Lung cancer screening. / Every year if you  are aged 16-80 years and have a 30-pack-year history of smoking and currently smoke or have quit within the past 15 years. Yearly screening is stopped once you have quit smoking for at least 15 years or develop a health problem that would prevent you from having lung cancer treatment.  Clinical breast exam.** / Every year after age 74 years.  BRCA-related cancer risk assessment.** / For women who have family members with a BRCA-related cancer (breast, ovarian, tubal, or peritoneal cancers).  Mammogram.** / Every year beginning at age 44 years and continuing for as long as you are in good health. Consult with your health care provider.  Pap test.** / Every 3 years starting at age 58 years through age 22 or 39 years with 3 consecutive normal Pap tests. Testing can be stopped between 65 and 70 years with 3 consecutive normal Pap tests and no abnormal Pap or HPV tests in the past 10 years.  HPV screening.** / Every 3 years from ages 64 years through ages 70 or 61 years with a history of 3 consecutive normal Pap tests. Testing can be stopped between 65 and 70 years with 3 consecutive normal Pap tests and no abnormal Pap or HPV tests in the past 10 years.  Fecal occult blood test (FOBT) of stool. / Every year beginning at age 40 years and continuing until age 27 years. You may not need to do this test if you get a colonoscopy every 10 years.  Flexible sigmoidoscopy or colonoscopy.** / Every 5 years for a flexible sigmoidoscopy or every 10 years for a colonoscopy beginning at age 7 years and continuing until age 32 years.  Hepatitis C blood test.** / For all people born from [birth year] through 1965 and any individual with known risks for hepatitis C.  Osteoporosis screening.** / A one-time screening for women ages 30 years and over and women at risk for fractures or osteoporosis.  Skin self-exam. / Monthly.  Influenza vaccine. / Every year.  Tetanus, diphtheria, and acellular pertussis (Tdap/Td)  vaccine.** / 1 dose of Td every 10 years.  Varicella vaccine.** / Consult your health care provider.  Zoster vaccine.** / 1 dose for adults aged 35 years or older.  Pneumococcal 13-valent conjugate (PCV13) vaccine.** / Consult your health care provider.  Pneumococcal polysaccharide (PPSV23) vaccine.** / 1 dose for all adults aged 46 years and older.  Meningococcal vaccine.** / Consult your health care provider.  Hepatitis A vaccine.** / Consult your health care provider.  Hepatitis B vaccine.** / Consult your health care provider.  Haemophilus influenzae type b (Hib) vaccine.** / Consult your health care provider. ** Family history and personal history of risk and conditions may change your health care provider's recommendations.   This information is not intended to replace advice given to you by your health care provider. Make sure you discuss any questions you have with your health care provider.   Document Released: 02/19/2001 Document Revised: 01/14/2014 Document Reviewed: 05/21/2010 Elsevier Interactive Patient Education [employer].

## 2015-01-24 NOTE — Progress Notes (Signed)
GYNECOLOGY CLINIC ANNUAL PREVENTATIVE CARE ENCOUNTER NOTE  Subjective:   Isabel Foley is a 45 y.o. [phone] female here for a routine annual gynecologic exam.  Current complaints: heavy menstrual bleeding for a couple of days every cycle associated with cramping and back pain. This started in the last few months.   Denies discharge, pelvic pain, problems with intercourse or other gynecologic concerns.    Gynecologic History Patient's last menstrual period was 01/12/2015. Contraception: vasectomy Last Pap: 2010. Results were: normal Never had a mammogram  Obstetric History OB History  Gravida Para Term Preterm AB SAB TAB Ectopic Multiple Living  7 6 6  1     6     # Outcome Date GA Lbr Len/2nd Weight Sex Delivery Anes PTL Lv  7 Term      Vag-Spont     6 Term      Vag-Spont     5 Term      Vag-Spont     4 Term      Vag-Spont     3 Term      Vag-Spont     2 Term      Vag-Spont     1 AB               Past Medical History  Diagnosis Date  . Allergic rhinitis, cause unspecified   . Depressive disorder, not elsewhere classified   . Dizziness and giddiness   . Other malaise and fatigue   . Esophageal reflux   . Nontoxic multinodular goiter   . Pure hypercholesterolemia   . Premenstrual tension syndromes   . Other chronic sinusitis   . Peptic ulcer, unspecified site, unspecified as acute or chronic, without mention of hemorrhage, perforation, or obstruction   . Abdominal pain, right upper quadrant   . Pain in limb   . Unspecified hypothyroidism     Past Surgical History  Procedure Laterality Date  . Dilation and curettage of uterus  2002  . Leep  2000    Current Outpatient Prescriptions on File Prior to Visit  Medication Sig Dispense Refill  . ALPRAZolam (XANAX) 0.25 MG tablet Can use  1 tab daily  as needed for anxiety on long trips. 20 tablet 0  . cetirizine (ZYRTEC) 10 MG tablet Take 10 mg by mouth daily.    . cyclobenzaprine (FLEXERIL) 10 MG tablet TAKE 1  TABLET (10 MG TOTAL) BY MOUTH AT BEDTIME. 30 tablet 0  . fluconazole (DIFLUCAN) 150 MG tablet Take 1 tablet (150 mg total) by mouth once. 1 tablet 0  . mometasone (NASONEX) 50 MCG/ACT nasal spray Place 2 sprays into the nose daily.    . montelukast (SINGULAIR) 10 MG tablet Take 10 mg by mouth at bedtime.      . Multiple Vitamin (MULTIVITAMIN) tablet Take 1 tablet by mouth daily.    . naproxen (NAPROSYN) 500 MG tablet Take 1 tablet (500 mg total) by mouth 3 (three) times daily with meals. 90 tablet 0  . phenazopyridine (PYRIDIUM) 200 MG tablet Take 1 tablet (200 mg total) by mouth 3 (three) times daily as needed for pain. 10 tablet 0  . Probiotic Product (PROBIOTIC FORMULA PO) Take 1 tablet by mouth daily.    . rizatriptan (MAXALT) 10 MG tablet Take 1 tablet (10 mg total) by mouth as needed for migraine. May repeat in 2 hours if needed 10 tablet 0  . sulfamethoxazole-trimethoprim (BACTRIM DS,SEPTRA DS) 800-160 MG tablet Take 1 tablet by mouth 2 (two) times  daily. 10 tablet 0  . topiramate (TOPAMAX) 25 MG tablet Take 1 tablet (25 mg total) by mouth at bedtime as needed. 30 tablet 3  . Trospium Chloride 60 MG CP24 1 tab po daily 30 each 3   No current facility-administered medications on file prior to visit.    No Known Allergies  Social History   Social History  . Marital Status: Married    Spouse Name: N/A  . Number of Children: 7  . Years of Education: N/A   Occupational History  . Stay at home mom    Social History Main Topics  . Smoking status: Never Smoker   . Smokeless tobacco: Never Used  . Alcohol Use: No  . Drug Use: No  . Sexual Activity: Not on file   Other Topics Concern  . Not on file   Social History Narrative   7 children; 6 biologic.Marland KitchenMarland KitchenADHD      Married; previously divorced      Stay at home mom      Regular exercise-yes, daily aerobics, treadmill      Diet: Skips meals, limited veggies; ice cream in AM    Family History  Problem Relation Age of Onset    . Hypertension Father   . Hypothyroidism Father   . Atrial fibrillation Brother 20  . Other Sister     Collagen Vascular Disease    The following portions of the patient's history were reviewed and updated as appropriate: allergies, current medications, past family history, past medical history, past social history, past surgical history and problem list.  Review of Systems Pertinent items noted in HPI and remainder of comprehensive ROS otherwise negative.   Objective:  BP 130/87 mmHg  Pulse 105  Ht 5\' 2"  (1.575 m)  Wt 151 lb (68.493 kg)  BMI 27.61 kg/m2  LMP 01/12/2015 CONSTITUTIONAL: Well-developed, well-nourished female in no acute distress.  HENT:  Normocephalic, atraumatic, External right and left ear normal. Oropharynx is clear and moist EYES: Conjunctivae and EOM are normal. Pupils are equal, round, and reactive to light. No scleral icterus.  NECK: Normal range of motion, supple, no masses.  Normal thyroid.  SKIN: Skin is warm and dry. No rash noted. Not diaphoretic. No erythema. No pallor. [place]: Alert and oriented to person, place, and time. Normal reflexes, muscle tone coordination. No cranial nerve deficit noted. PSYCHIATRIC: Normal mood and affect. Normal behavior. Normal judgment and thought content. CARDIOVASCULAR: Normal heart rate noted, regular rhythm RESPIRATORY: Clear to auscultation bilaterally. Effort and breath sounds normal, no problems with respiration noted. BREASTS: Symmetric in size. No masses, skin changes, nipple drainage, or lymphadenopathy. ABDOMEN: Soft, normal bowel sounds, no distention noted.  No tenderness, rebound or guarding.  PELVIC: Normal appearing external genitalia; normal appearing vaginal mucosa and cervix.  No abnormal discharge noted.  Pap smear obtained.  Normal uterine size, no other palpable masses, no uterine or adnexal tenderness. MUSCULOSKELETAL: Normal range of motion. No tenderness.  No cyanosis, clubbing, or edema.  2+  distal pulses.   Assessment:  Annual gynecologic examination with pap smear Menorrhagia   Plan:  Will follow up results of pap smear and manage accordingly. Mammogram scheduled Pelvic ultrasound ordered; Lysteda prescribed prn heavy bleeding Routine preventative health maintenance measures emphasized. Please refer to After Visit Summary for other counseling recommendations.    Verita Schneiders, MD, [place] Attending Obstetrician & Gynecologist, [place] [facility]

## 2015-01-26 LAB — CYTOLOGY - PAP

## 2015-02-27 ENCOUNTER — Ambulatory Visit: Payer: BLUE CROSS/BLUE SHIELD

## 2015-03-01 ENCOUNTER — Ambulatory Visit
Admission: RE | Admit: 2015-03-01 | Discharge: 2015-03-01 | Disposition: A | Discharge status: Home / Self Care | Payer: BLUE CROSS/BLUE SHIELD | Source: Ambulatory Visit | Attending: Obstetrics & Gynecology | Admitting: Obstetrics & Gynecology

## 2015-03-01 DIAGNOSIS — R921 Mammographic calcification found on diagnostic imaging of breast: Secondary | ICD-10-CM | POA: Diagnosis not present

## 2015-03-01 DIAGNOSIS — Z1239 Encounter for other screening for malignant neoplasm of breast: Secondary | ICD-10-CM

## 2015-03-01 DIAGNOSIS — Z1231 Encounter for screening mammogram for malignant neoplasm of breast: Secondary | ICD-10-CM | POA: Diagnosis not present

## 2015-03-03 ENCOUNTER — Other Ambulatory Visit: Payer: Self-pay | Admitting: Obstetrics & Gynecology

## 2015-03-03 DIAGNOSIS — R928 Other abnormal and inconclusive findings on diagnostic imaging of breast: Secondary | ICD-10-CM

## 2015-03-09 ENCOUNTER — Telehealth: Payer: Self-pay | Admitting: Family Medicine

## 2015-03-09 DIAGNOSIS — E7800 Pure hypercholesterolemia, unspecified: Secondary | ICD-10-CM

## 2015-03-09 DIAGNOSIS — E039 Hypothyroidism, unspecified: Secondary | ICD-10-CM

## 2015-03-09 DIAGNOSIS — E042 Nontoxic multinodular goiter: Secondary | ICD-10-CM

## 2015-03-09 NOTE — Telephone Encounter (Signed)
-----   Message from Ellamae Sia sent at 03/02/2015  6:52 PM EST ----- Regarding: Lab orders for Friday, 3.3.17 Patient is scheduled for CPX labs, please order future labs, Thanks , Karna Christmas

## 2015-03-10 ENCOUNTER — Other Ambulatory Visit (INDEPENDENT_AMBULATORY_CARE_PROVIDER_SITE_OTHER): Payer: BLUE CROSS/BLUE SHIELD

## 2015-03-10 DIAGNOSIS — E7800 Pure hypercholesterolemia, unspecified: Secondary | ICD-10-CM | POA: Diagnosis not present

## 2015-03-10 DIAGNOSIS — E039 Hypothyroidism, unspecified: Secondary | ICD-10-CM | POA: Diagnosis not present

## 2015-03-10 LAB — COMPREHENSIVE METABOLIC PANEL
ALT: 11 U/L (ref 0–35)
AST: 17 U/L (ref 0–37)
Albumin: 4.1 g/dL (ref 3.5–5.2)
Alkaline Phosphatase: 59 U/L (ref 39–117)
BUN: 12 mg/dL (ref 6–23)
CHLORIDE: 107 meq/L (ref 96–112)
CO2: 25 meq/L (ref 19–32)
Calcium: 9.2 mg/dL (ref 8.4–10.5)
Creatinine, Ser: 0.86 mg/dL (ref 0.40–1.20)
GFR: 76.13 mL/min (ref >60.00)
Glucose, Bld: 84 mg/dL (ref 70–99)
POTASSIUM: 3.8 meq/L (ref 3.5–5.1)
Sodium: 140 mEq/L (ref 135–145)
TOTAL PROTEIN: 7.3 g/dL (ref 6.0–8.3)
Total Bilirubin: 0.5 mg/dL (ref 0.2–1.2)

## 2015-03-10 LAB — LIPID PANEL
CHOL/HDL RATIO: 4
CHOLESTEROL: 172 mg/dL (ref 0–200)
HDL: 48 mg/dL (ref >39.00)
LDL CALC: 104 mg/dL — AB (ref 0–99)
NonHDL: 124.02
TRIGLYCERIDES: 101 mg/dL (ref 0.0–149.0)
VLDL: 20.2 mg/dL (ref 0.0–40.0)

## 2015-03-10 LAB — TSH: TSH: 1.92 u[IU]/mL (ref 0.35–4.50)

## 2015-03-10 LAB — T4, FREE: FREE T4: 0.78 ng/dL (ref 0.60–1.60)

## 2015-03-10 LAB — T3, FREE: T3, Free: 2.9 pg/mL (ref 2.3–4.2)

## 2015-03-17 ENCOUNTER — Encounter: Payer: BLUE CROSS/BLUE SHIELD | Admitting: Family Medicine

## 2015-03-21 ENCOUNTER — Ambulatory Visit (INDEPENDENT_AMBULATORY_CARE_PROVIDER_SITE_OTHER): Payer: BLUE CROSS/BLUE SHIELD | Admitting: Family Medicine

## 2015-03-21 ENCOUNTER — Encounter: Payer: Self-pay | Admitting: Family Medicine

## 2015-03-21 VITALS — BP 108/78 | HR 84 | Temp 98.1°F | Ht 62.5 in | Wt 144.5 lb

## 2015-03-21 DIAGNOSIS — E7800 Pure hypercholesterolemia, unspecified: Secondary | ICD-10-CM | POA: Diagnosis not present

## 2015-03-21 DIAGNOSIS — G43009 Migraine without aura, not intractable, without status migrainosus: Secondary | ICD-10-CM

## 2015-03-21 DIAGNOSIS — M5442 Lumbago with sciatica, left side: Secondary | ICD-10-CM

## 2015-03-21 DIAGNOSIS — M5440 Lumbago with sciatica, unspecified side: Secondary | ICD-10-CM

## 2015-03-21 DIAGNOSIS — M549 Dorsalgia, unspecified: Secondary | ICD-10-CM | POA: Insufficient documentation

## 2015-03-21 DIAGNOSIS — F418 Other specified anxiety disorders: Secondary | ICD-10-CM

## 2015-03-21 DIAGNOSIS — G8929 Other chronic pain: Secondary | ICD-10-CM | POA: Insufficient documentation

## 2015-03-21 DIAGNOSIS — Z8639 Personal history of other endocrine, nutritional and metabolic disease: Secondary | ICD-10-CM | POA: Diagnosis not present

## 2015-03-21 MED ORDER — DICLOFENAC SODIUM 75 MG PO TBEC
75.0000 mg | DELAYED_RELEASE_TABLET | Freq: Two times a day (BID) | ORAL | Status: DC
Start: 2015-03-21 — End: 2016-06-11

## 2015-03-21 NOTE — Progress Notes (Addendum)
Subjective:    Patient ID: Isabel Foley, female    DOB: Nov 12, 1970, 45 y.o.   MRN: [id number]  HPI  The patient is here for annual wellness exam and preventative care.   She sees GYN for pap/pelvic and breast exam. Last OV: 01/24/2015  Placed on lysteada for menorrhagia. May do a pelvic US.  Elevated Cholesterol:  At goal on no medication. Using plexus health supplement. Lab Results  Component Value Date   CHOL 172 03/10/2015   HDL 48.00 03/10/2015   LDLCALC 104* 03/10/2015   TRIG 101.0 03/10/2015   CHOLHDL 4 03/10/2015  Diet compliance: Good Exercise: Frequent Other complaints:  Hypothyroid:  Stable on no meds.  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  Migraine: No recent migraines in last several months. On topiramate and using Relpax for rescue.  Has been having issues with low back pain x years.  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 Occ pain down left leg and in low back, worse in last month. Pain does go from right to left low back, occ numbness in left leg and stiffness in left leg, pain in left low back., Has been using naprosyn for back pain. Using muscle relaxant at night. Last month had X-ray at chiropractor.     BP Readings from Last 3 Encounters:  03/21/15 108/78  01/24/15 130/87  01/03/15 120/80    Review of Systems  Constitutional: Negative for fever and fatigue.  HENT: Negative for ear pain.   Eyes: Negative for pain.  Respiratory: Negative for chest tightness and shortness of breath.   Cardiovascular: Negative for chest pain, palpitations and leg swelling.  Gastrointestinal: Negative for abdominal pain.  Genitourinary: Negative for dysuria.       Objective:   Physical Exam  Constitutional: Vital signs are normal. She appears well-developed and well-nourished. She is cooperative.  Non-toxic appearance. She does not appear  ill. No distress.  HENT:  Head: Normocephalic.  Right Ear: Hearing, tympanic membrane, external ear and ear canal normal. Tympanic membrane is not erythematous, not retracted and not bulging.  Left Ear: Hearing, tympanic membrane, external ear and ear canal normal. Tympanic membrane is not erythematous, not retracted and not bulging.  Nose: No mucosal edema or rhinorrhea. Right sinus exhibits no maxillary sinus tenderness and no frontal sinus tenderness. Left sinus exhibits no maxillary sinus tenderness and no frontal sinus tenderness.  Mouth/Throat: Uvula is midline, oropharynx is clear and moist and mucous membranes are normal.  Eyes: Conjunctivae, EOM and lids are normal. Pupils are equal, round, and reactive to light. Lids are everted and swept, no foreign bodies found.  Neck: Trachea normal and normal range of motion. Neck supple. Carotid bruit is not present. No thyroid mass and no thyromegaly present.  Cardiovascular: Normal rate, regular rhythm, S1 normal, S2 normal, normal heart sounds, intact distal pulses and normal pulses.  Exam reveals no gallop and no friction rub.   No murmur heard. Pulmonary/Chest: Effort normal and breath sounds normal. No tachypnea. No respiratory distress. She has no decreased breath sounds. She has no wheezes. She has no rhonchi. She has no rales.  Abdominal: Soft. Normal appearance and bowel sounds are normal. There is no tenderness.  Musculoskeletal:       Lumbar back: She exhibits tenderness. She exhibits normal range of motion and no bony tenderness.  Neg SLR today, neg Faber's  bilateral hips  stiff  Neurological: She is alert.  Skin: Skin is warm, dry and intact. No rash noted.  Psychiatric: Her speech is normal and behavior is normal. Judgment and thought content normal. Her mood appears not anxious. Cognition and memory are normal. She does not exhibit a depressed mood.          Assessment & Plan:  The patient's preventative maintenance and  recommended screening tests for an annual wellness exam were reviewed in full today. Brought up to date unless services declined.  Counselled on the importance of diet, exercise, and its role in overall health and mortality. The patient's FH and SH was reviewed, including their home life, tobacco status, and drug and alcohol status.   Nml pap with GYN Mammogram:  Calcifications in right breast 02/2015.. rec diagnostic.  Nonsmoker  No STD testing. Uptodate with Td 08/2014, refused flu

## 2015-03-21 NOTE — Assessment & Plan Note (Signed)
Stable control on topamax during allergy season.

## 2015-03-21 NOTE — Assessment & Plan Note (Signed)
Nml TSH, free t3 and free t4

## 2015-03-21 NOTE — Assessment & Plan Note (Signed)
Currently seeing chiropractor. Refer for PT and change to diclofenac BID.  If not improving consider MRI eval and ESI.

## 2015-03-21 NOTE — Progress Notes (Signed)
Pre visit review using our clinic review tool, if applicable. No additional management support is needed unless otherwise documented below in the visit note. 

## 2015-03-21 NOTE — Patient Instructions (Signed)
Stop naproxen.  Start diclofenac twice daily for pain and inflammation.  Stop at front desk for PT referral.  Follow up if not improving after 2-4 weeks.

## 2015-03-21 NOTE — Assessment & Plan Note (Signed)
At goal on no med. 

## 2015-03-21 NOTE — Addendum Note (Signed)
Addended byEliezer Lofts E on: 03/21/2015 10:25 AM   Modules accepted: SmartSet

## 2015-05-15 ENCOUNTER — Ambulatory Visit
Admission: RE | Admit: 2015-05-15 | Discharge: 2015-05-15 | Disposition: A | Discharge status: Home / Self Care | Payer: BLUE CROSS/BLUE SHIELD | Source: Ambulatory Visit | Attending: Obstetrics & Gynecology | Admitting: Obstetrics & Gynecology

## 2015-05-15 DIAGNOSIS — R928 Other abnormal and inconclusive findings on diagnostic imaging of breast: Secondary | ICD-10-CM

## 2015-05-15 DIAGNOSIS — R921 Mammographic calcification found on diagnostic imaging of breast: Secondary | ICD-10-CM | POA: Insufficient documentation

## 2015-05-17 ENCOUNTER — Other Ambulatory Visit: Payer: Self-pay | Admitting: Obstetrics & Gynecology

## 2015-05-17 DIAGNOSIS — R920 Mammographic microcalcification found on diagnostic imaging of breast: Secondary | ICD-10-CM

## 2015-06-01 ENCOUNTER — Ambulatory Visit
Admission: RE | Admit: 2015-06-01 | Discharge: 2015-06-01 | Disposition: A | Discharge status: Home / Self Care | Payer: BLUE CROSS/BLUE SHIELD | Source: Ambulatory Visit | Attending: Obstetrics & Gynecology | Admitting: Obstetrics & Gynecology

## 2015-06-01 DIAGNOSIS — R921 Mammographic calcification found on diagnostic imaging of breast: Secondary | ICD-10-CM | POA: Insufficient documentation

## 2015-06-01 DIAGNOSIS — R920 Mammographic microcalcification found on diagnostic imaging of breast: Secondary | ICD-10-CM

## 2015-06-01 HISTORY — PX: BREAST BIOPSY: SHX20

## 2015-06-02 LAB — SURGICAL PATHOLOGY

## 2015-08-16 ENCOUNTER — Ambulatory Visit (INDEPENDENT_AMBULATORY_CARE_PROVIDER_SITE_OTHER): Payer: BLUE CROSS/BLUE SHIELD | Admitting: Family Medicine

## 2015-08-16 ENCOUNTER — Encounter: Payer: Self-pay | Admitting: Family Medicine

## 2015-08-16 VITALS — BP 116/76 | HR 71 | Temp 98.0°F | Ht 62.5 in | Wt 132.4 lb

## 2015-08-16 DIAGNOSIS — B3731 Acute candidiasis of vulva and vagina: Secondary | ICD-10-CM

## 2015-08-16 DIAGNOSIS — J0101 Acute recurrent maxillary sinusitis: Secondary | ICD-10-CM

## 2015-08-16 DIAGNOSIS — B373 Candidiasis of vulva and vagina: Secondary | ICD-10-CM

## 2015-08-16 MED ORDER — AMOXICILLIN 500 MG PO CAPS: 1000.0000 mg | ORAL_CAPSULE | Freq: Two times a day (BID) | ORAL | 0 refills | Status: DC

## 2015-08-16 MED ORDER — FLUCONAZOLE 150 MG PO TABS: 150.0000 mg | ORAL_TABLET | Freq: Once | ORAL | 0 refills | Status: AC

## 2015-08-16 NOTE — Progress Notes (Signed)
Subjective:    Patient ID: Isabel Foley, female    DOB: December 25, 1970, 45 y.o.   MRN: [id number]  HPI This is a 45 yo female who presents today with 3 days of sinus pressure, headache, chills, fever (subjective) and fatigue. Had a cough while at the beach 3 weeks ago that has since resolved. Has had sneezing and night time sore throat. Pain in right side of sinuses. Had some bloody drainage. Ears feel muffled. History of seasonal allergies and is on Zyrtec, nasonex and singulair. Has been followed by ENT for chronic sinusitis, deviated septum and nasal polyps. Is concerned about being sick as school is getting started (she has 6 children). She requests same prescription she had previously- had good results.   Has been watching diet and taking supplements for weight loss.   Past Medical History:  Diagnosis Date  . Abdominal pain, right upper quadrant   . Allergic rhinitis, cause unspecified   . Depressive disorder, not elsewhere classified   . Dizziness and giddiness   . Esophageal reflux   . Nontoxic multinodular goiter   . Other chronic sinusitis   . Other malaise and fatigue   . Pain in limb   . Peptic ulcer, unspecified site, unspecified as acute or chronic, without mention of hemorrhage, perforation, or obstruction   . Premenstrual tension syndromes   . Pure hypercholesterolemia   . Unspecified hypothyroidism    Past Surgical History:  Procedure Laterality Date  . BREAST BIOPSY Right 06/01/2015   stereo, path pending  . DILATION AND CURETTAGE OF UTERUS  2002  . LEEP  2000   Family History  Problem Relation Age of Onset  . Hypertension Father   . Hypothyroidism Father   . Atrial fibrillation Brother 20  . Other Sister     Collagen Vascular Disease  . Breast cancer Neg Hx     Review of Systems Per HPI    Objective:   Physical Exam  Constitutional: She is oriented to person, place, and time. She appears well-developed and well-nourished. No distress.  HENT:  Head:  Normocephalic and atraumatic.  Right Ear: External ear and ear canal normal. Tympanic membrane is scarred.  Left Ear: External ear and ear canal normal. Tympanic membrane is scarred.  Nose: Mucosal edema, rhinorrhea and septal deviation present. Right sinus exhibits maxillary sinus tenderness and frontal sinus tenderness. Left sinus exhibits maxillary sinus tenderness and frontal sinus tenderness.  Mouth/Throat: Uvula is midline, oropharynx is clear and moist and mucous membranes are normal.  Scarring on bilateral TMs documented at previous visits. Patient sounds congested.   Eyes: Conjunctivae are normal.  Neck: Normal range of motion. Neck supple.  Cardiovascular: Normal rate, regular rhythm and normal heart sounds.   Pulmonary/Chest: Effort normal and breath sounds normal.  Lymphadenopathy:    She has no cervical adenopathy.  Neurological: She is alert and oriented to person, place, and time.  Skin: Skin is warm and dry. She is not diaphoretic.  Psychiatric: She has a normal mood and affect. Her behavior is normal. Judgment and thought content normal.  Vitals reviewed.    BP 116/76 (BP Location: Left Arm, Patient Position: Sitting, Cuff Size: Normal)   Pulse 71   Temp 98 F (36.7 C) (Oral)   Ht 5' 2.5" (1.588 m)   Wt 132 lb 6.4 oz (60.1 kg)   SpO2 98%   BMI 23.83 kg/m  Wt Readings from Last 3 Encounters:  08/16/15 132 lb 6.4 oz (60.1 kg)  03/21/15 144 lb  8 oz (65.5 kg)  01/24/15 151 lb (68.5 kg)        Assessment & Plan:  1. Acute recurrent maxillary sinusitis - Provided written and verbal information regarding diagnosis and treatment. - reviewed return to clinic precautions - amoxicillin (AMOXIL) 500 MG capsule; Take 2 capsules (1,000 mg total) by mouth 2 (two) times daily.  Dispense: 28 capsule; Refill: 0  2. Vaginal yeast infection - fluconazole (DIFLUCAN) 150 MG tablet; Take 1 tablet (150 mg total) by mouth once. Repeat if needed  Dispense: 2 tablet; Refill:  0   Clarene Reamer, FNP-BC  [facility] [place] [facility]  08/18/2015 4:30 PM

## 2015-08-16 NOTE — Patient Instructions (Addendum)
Please take Afrin nasal spray twice a day for 3 days, use in the evening prior to Nasonex  Can continue saline nasal spray   Sinusitis, Adult Sinusitis is redness, soreness, and inflammation of the paranasal sinuses. Paranasal sinuses are air pockets within the bones of your face. They are located beneath your eyes, in the middle of your forehead, and above your eyes. In healthy paranasal sinuses, mucus is able to drain out, and air is able to circulate through them by way of your nose. However, when your paranasal sinuses are inflamed, mucus and air can become trapped. This can allow bacteria and other germs to grow and cause infection. Sinusitis can develop quickly and last only a short time (acute) or continue over a long period (chronic). Sinusitis that lasts for more than 12 weeks is considered chronic. CAUSES Causes of sinusitis include:  Allergies.  Structural abnormalities, such as displacement of the cartilage that separates your nostrils (deviated septum), which can decrease the air flow through your nose and sinuses and affect sinus drainage.  Functional abnormalities, such as when the small hairs (cilia) that line your sinuses and help remove mucus do not work properly or are not present. SIGNS AND SYMPTOMS Symptoms of acute and chronic sinusitis are the same. The primary symptoms are pain and pressure around the affected sinuses. Other symptoms include:  Upper toothache.  Earache.  Headache.  Bad breath.  Decreased sense of smell and taste.  A cough, which worsens when you are lying flat.  Fatigue.  Fever.  Thick drainage from your nose, which often is green and may contain pus (purulent).  Swelling and warmth over the affected sinuses. DIAGNOSIS Your health care provider will perform a physical exam. During your exam, your health care provider may perform any of the following to help determine if you have acute sinusitis or chronic sinusitis:  Look in your nose  for signs of abnormal growths in your nostrils (nasal polyps).  Tap over the affected sinus to check for signs of infection.  View the inside of your sinuses using an imaging device that has a light attached (endoscope). If your health care provider suspects that you have chronic sinusitis, one or more of the following tests may be recommended:  Allergy tests.  Nasal culture. A sample of mucus is taken from your nose, sent to a lab, and screened for bacteria.  Nasal cytology. A sample of mucus is taken from your nose and examined by your health care provider to determine if your sinusitis is related to an allergy. TREATMENT Most cases of acute sinusitis are related to a viral infection and will resolve on their own within 10 days. Sometimes, medicines are prescribed to help relieve symptoms of both acute and chronic sinusitis. These may include pain medicines, decongestants, nasal steroid sprays, or saline sprays. However, for sinusitis related to a bacterial infection, your health care provider will prescribe antibiotic medicines. These are medicines that will help kill the bacteria causing the infection. Rarely, sinusitis is caused by a fungal infection. In these cases, your health care provider will prescribe antifungal medicine. For some cases of chronic sinusitis, surgery is needed. Generally, these are cases in which sinusitis recurs more than 3 times per year, despite other treatments. HOME CARE INSTRUCTIONS  Drink plenty of water. Water helps thin the mucus so your sinuses can drain more easily.  Use a humidifier.  Inhale steam 3-4 times a day (for example, sit in the bathroom with the shower running).  Apply  a warm, moist washcloth to your face 3-4 times a day, or as directed by your health care provider.  Use saline nasal sprays to help moisten and clean your sinuses.  Take medicines only as directed by your health care provider.  If you were prescribed either an antibiotic  or antifungal medicine, finish it all even if you start to feel better. SEEK IMMEDIATE MEDICAL CARE IF:  You have increasing pain or severe headaches.  You have nausea, vomiting, or drowsiness.  You have swelling around your face.  You have vision problems.  You have a stiff neck.  You have difficulty breathing.   This information is not intended to replace advice given to you by your health care provider. Make sure you discuss any questions you have with your health care provider.   Document Released: 12/24/2004 Document Revised: 01/14/2014 Document Reviewed: 01/08/2011 Elsevier Interactive Patient Education [employer].

## 2015-10-18 ENCOUNTER — Ambulatory Visit (INDEPENDENT_AMBULATORY_CARE_PROVIDER_SITE_OTHER): Payer: BLUE CROSS/BLUE SHIELD | Admitting: Family Medicine

## 2015-10-18 ENCOUNTER — Encounter: Payer: Self-pay | Admitting: Family Medicine

## 2015-10-18 VITALS — BP 122/82 | HR 68 | Temp 98.5°F | Wt 128.8 lb

## 2015-10-18 DIAGNOSIS — H1013 Acute atopic conjunctivitis, bilateral: Secondary | ICD-10-CM

## 2015-10-18 DIAGNOSIS — J309 Allergic rhinitis, unspecified: Secondary | ICD-10-CM

## 2015-10-18 NOTE — Progress Notes (Signed)
Pre visit review using our clinic review tool, if applicable. No additional management support is needed unless otherwise documented below in the visit note. 

## 2015-10-18 NOTE — Progress Notes (Signed)
Subjective:    Patient ID: Isabel Foley, female    DOB: July 16, 1970, 45 y.o.   MRN: [id number]  HPI This is a 45 yo female who presents today with left eye irritation. She was walking 3 days ago and felt a bug fly into her eye. She does not know how big or what type of bug. She did not see the bug. She has had some morning matting, but otherwise no drainage. Left eye currently not symptomatic- no visual changes, no headache, no foreign body sensation, but right eye has some itching. Not using any medication, is able to wear her contact lens. No visual changes. Has seasonal allergies and takes singulair, zyrtec and has prescriptions antihistamine eye drops that she has not used.   Has an appointment with Dr. Diona Browner in 2 days to discuss chronic, generalized itching. Would like a refill of her Xanax for travel at [place] from Dr. Diona Browner.   Past Medical History:  Diagnosis Date  . Abdominal pain, right upper quadrant   . Allergic rhinitis, cause unspecified   . Depressive disorder, not elsewhere classified   . Dizziness and giddiness   . Esophageal reflux   . Nontoxic multinodular goiter   . Other chronic sinusitis   . Other malaise and fatigue   . Pain in limb   . Peptic ulcer, unspecified site, unspecified as acute or chronic, without mention of hemorrhage, perforation, or obstruction   . Premenstrual tension syndromes   . Pure hypercholesterolemia   . Unspecified hypothyroidism    Past Surgical History:  Procedure Laterality Date  . BREAST BIOPSY Right 06/01/2015   stereo, path pending  . DILATION AND CURETTAGE OF UTERUS  2002  . LEEP  2000   Family History  Problem Relation Age of Onset  . Hypertension Father   . Hypothyroidism Father   . Atrial fibrillation Brother 20  . Other Sister     Collagen Vascular Disease  . Breast cancer Neg Hx    Social History  Substance Use Topics  . Smoking status: Never Smoker  . Smokeless tobacco: Never Used  . Alcohol use No       Review of Systems Per HPI    Objective:   Physical Exam  Constitutional: She is oriented to person, place, and time. She appears well-developed and well-nourished. No distress.  HENT:  Head: Normocephalic and atraumatic.  Eyes: Pupils are equal, round, and reactive to light. Right eye exhibits no discharge and no exudate. Left eye exhibits no discharge and no exudate. Right conjunctiva is not injected. Right conjunctiva has no hemorrhage. Left conjunctiva is injected (mildly, LLQ). Left conjunctiva has no hemorrhage. Right eye exhibits normal extraocular motion. Left eye exhibits normal extraocular motion.  No lid swelling.   Musculoskeletal: Normal range of motion.  Neurological: She is alert and oriented to person, place, and time.  Skin: Skin is warm and dry. She is not diaphoretic.  Psychiatric: She has a normal mood and affect. Her behavior is normal. Judgment and thought content normal.  Vitals reviewed.    BP 122/82   Pulse 68   Temp 98.5 F (36.9 C) (Oral)   Wt 128 lb 12 oz (58.4 kg)   LMP 09/20/2015   BMI 23.17 kg/m  Wt Readings from Last 3 Encounters:  10/18/15 128 lb 12 oz (58.4 kg)  08/16/15 132 lb 6.4 oz (60.1 kg)  03/21/15 144 lb 8 oz (65.5 kg)       Assessment & Plan:  1. Allergic conjunctivitis  and rhinitis, bilateral - suspect this in not related to contact with insect, but more likely related to chronic allergies  - Provided written and verbal information regarding diagnosis and treatment. - encouraged her to use her antihistamine eye drops and lubricating drops - RTC precautions reviewed   Clarene Reamer, FNP-BC  [facility], [facility]  10/18/2015 2:19 PM

## 2015-10-18 NOTE — Patient Instructions (Signed)
Try your antihistamine eye drops and lubricating drops  Allergic Conjunctivitis Allergic conjunctivitis is inflammation of the clear membrane that covers the white part of your eye and the inner surface of your eyelid (conjunctiva), and it is caused by allergies. The blood vessels in the conjunctiva become inflamed, and this causes the eye to become red or pink, and it often causes itchiness in the eye. Allergic conjunctivitis cannot be spread by one person to another person (noncontagious). CAUSES This condition is caused by an allergic reaction. Common causes of an allergic reaction (allergens) include:  Dust.  Pollen.  Mold.  Animal dander or secretions. RISK FACTORS This condition is more likely to develop if you are exposed to high levels of allergens that cause the allergic reaction. This might include being outdoors when air pollen levels are high or being around animals that you are allergic to. SYMPTOMS Symptoms of this condition may include:  Eye redness.  Tearing of the eyes.  Watery eyes.  Itchy eyes.  Burning feeling in the eyes.  Clear drainage from the eyes.  Swollen eyelids. DIAGNOSIS This condition may be diagnosed by medical history and physical exam. If you have drainage from your eyes, it may be tested to rule out other causes of conjunctivitis. TREATMENT Treatment for this condition often includes medicines. These may be eye drops, ointments, or oral medicines. They may be prescription medicines or over-the-counter medicines. HOME CARE INSTRUCTIONS  Take or apply medicines only as directed by your health care provider.  Do not touch or rub your eyes.  Do not wear contact lenses until the inflammation is gone. Wear glasses instead.  Do not wear eye makeup until the inflammation is gone.  Apply a cool, clean washcloth to your eye for 10-20 minutes, 3-4 times a day.  Try to avoid whatever allergen is causing the allergic reaction. SEEK MEDICAL CARE  IF:  Your symptoms get worse.  You have pus draining from your eye.  You have new symptoms.  You have a fever.   This information is not intended to replace advice given to you by your health care provider. Make sure you discuss any questions you have with your health care provider.   Document Released: 03/16/2002 Document Revised: 01/14/2014 Document Reviewed: 10/05/2013 Elsevier Interactive Patient Education [employer].

## 2015-10-20 ENCOUNTER — Encounter: Payer: Self-pay | Admitting: Family Medicine

## 2015-10-20 ENCOUNTER — Ambulatory Visit (INDEPENDENT_AMBULATORY_CARE_PROVIDER_SITE_OTHER): Payer: BLUE CROSS/BLUE SHIELD | Admitting: Family Medicine

## 2015-10-20 VITALS — BP 124/83 | HR 78 | Temp 99.1°F | Ht 62.5 in | Wt 130.5 lb

## 2015-10-20 DIAGNOSIS — L299 Pruritus, unspecified: Secondary | ICD-10-CM | POA: Diagnosis not present

## 2015-10-20 LAB — CBC WITH DIFFERENTIAL/PLATELET
BASOS ABS: 0 10*3/uL (ref 0.0–0.1)
Basophils Relative: 0.4 % (ref 0.0–3.0)
EOS ABS: 0.1 10*3/uL (ref 0.0–0.7)
Eosinophils Relative: 1.5 % (ref 0.0–5.0)
HEMATOCRIT: 38.7 % (ref 36.0–46.0)
Hemoglobin: 13.1 g/dL (ref 12.0–15.0)
LYMPHS ABS: 1.6 10*3/uL (ref 0.7–4.0)
LYMPHS PCT: 17.7 % (ref 12.0–46.0)
MCHC: 34 g/dL (ref 30.0–36.0)
MCV: 91.8 fl (ref 78.0–100.0)
MONOS PCT: 11.3 % (ref 3.0–12.0)
Monocytes Absolute: 1 10*3/uL (ref 0.1–1.0)
NEUTROS ABS: 6.2 10*3/uL (ref 1.4–7.7)
NEUTROS PCT: 69.1 % (ref 43.0–77.0)
PLATELETS: 265 10*3/uL (ref 150.0–400.0)
RBC: 4.21 Mil/uL (ref 3.87–5.11)
RDW: 14 % (ref 11.5–15.5)
WBC: 9 10*3/uL (ref 4.0–10.5)

## 2015-10-20 LAB — COMPREHENSIVE METABOLIC PANEL
ALBUMIN: 4 g/dL (ref 3.5–5.2)
ALK PHOS: 55 U/L (ref 39–117)
ALT: 13 U/L (ref 0–35)
AST: 18 U/L (ref 0–37)
BUN: 16 mg/dL (ref 6–23)
CALCIUM: 9.2 mg/dL (ref 8.4–10.5)
CHLORIDE: 105 meq/L (ref 96–112)
CO2: 28 mEq/L (ref 19–32)
Creatinine, Ser: 0.77 mg/dL (ref 0.40–1.20)
GFR: 86.25 mL/min (ref >60.00)
Glucose, Bld: 70 mg/dL (ref 70–99)
POTASSIUM: 3.9 meq/L (ref 3.5–5.1)
Sodium: 140 mEq/L (ref 135–145)
TOTAL PROTEIN: 6.9 g/dL (ref 6.0–8.3)
Total Bilirubin: 0.3 mg/dL (ref 0.2–1.2)

## 2015-10-20 LAB — TSH: TSH: 1.67 u[IU]/mL (ref 0.35–4.50)

## 2015-10-20 LAB — VITAMIN D 25 HYDROXY (VIT D DEFICIENCY, FRACTURES): VITD: 84.71 ng/mL (ref 30.00–100.00)

## 2015-10-20 MED ORDER — ALPRAZOLAM 0.25 MG PO TABS: ORAL_TABLET | ORAL | 0 refills | Status: DC

## 2015-10-20 NOTE — Progress Notes (Signed)
   Subjective:    Patient ID: Isabel Foley, female    DOB: 1970/01/20, 45 y.o.   MRN: [id number]  HPI   45 year old female presents for new onset itching.  Worse in winter, ongoing for years. No rash. Bothers her a lot at night. Benadryl helps some. Aveeno lotions.  Oatmeal baths. Use hypoallergenic items.   Does use some Bath and Body stufff. No new meds in last few years.  Father with thyroid issues and he has itching as well.   She is concerned it may be something internal causing the issue.  Nml free T3 and free T4 nml.  Se sees allergist .. Allergic to molds.   Review of Systems  Constitutional: Negative for fatigue and fever.  HENT: Negative for ear pain.   Eyes: Negative for pain.  Respiratory: Negative for chest tightness and shortness of breath.   Cardiovascular: Negative for chest pain, palpitations and leg swelling.  Gastrointestinal: Negative for abdominal pain.  Genitourinary: Negative for dysuria.       Objective:   Physical Exam  Constitutional: Vital signs are normal. She appears well-developed and well-nourished. She is cooperative.  Non-toxic appearance. She does not appear ill. No distress.  HENT:  Head: Normocephalic.  Right Ear: Hearing, tympanic membrane, external ear and ear canal normal. Tympanic membrane is not erythematous, not retracted and not bulging.  Left Ear: Hearing, tympanic membrane, external ear and ear canal normal. Tympanic membrane is not erythematous, not retracted and not bulging.  Nose: No mucosal edema or rhinorrhea. Right sinus exhibits no maxillary sinus tenderness and no frontal sinus tenderness. Left sinus exhibits no maxillary sinus tenderness and no frontal sinus tenderness.  Mouth/Throat: Uvula is midline, oropharynx is clear and moist and mucous membranes are normal.  Eyes: Conjunctivae, EOM and lids are normal. Pupils are equal, round, and reactive to light. Lids are everted and swept, no foreign bodies found.    Neck: Trachea normal and normal range of motion. Neck supple. Carotid bruit is not present. No thyroid mass and no thyromegaly present.  Cardiovascular: Normal rate, regular rhythm, S1 normal, S2 normal, normal heart sounds, intact distal pulses and normal pulses.  Exam reveals no gallop and no friction rub.   No murmur heard. Pulmonary/Chest: Effort normal and breath sounds normal. No tachypnea. No respiratory distress. She has no decreased breath sounds. She has no wheezes. She has no rhonchi. She has no rales.  Abdominal: Soft. Normal appearance and bowel sounds are normal. There is no tenderness.  Neurological: She is alert.  Skin: Skin is warm, dry and intact. No rash noted.  Psychiatric: Her speech is normal and behavior is normal. Judgment and thought content normal. Her mood appears not anxious. Cognition and memory are normal. She does not exhibit a depressed mood.          Assessment & Plan:

## 2015-10-20 NOTE — Assessment & Plan Note (Signed)
Eval with labs. Likely allergic.. Treat with allergy meds. Try topical steroid cream compounded with Eucerin. Change to hypoallergenic.

## 2015-10-20 NOTE — Patient Instructions (Addendum)
Change to hypoallergenic lotions, soaps, detergents.  Use cream not lotion.. Try eucerin or cetaphil. Stop at lab on way out.  Start topical compounded steroid cream.

## 2015-10-20 NOTE — Progress Notes (Signed)
Pre visit review using our clinic review tool, if applicable. No additional management support is needed unless otherwise documented below in the visit note. 

## 2015-12-25 ENCOUNTER — Ambulatory Visit (INDEPENDENT_AMBULATORY_CARE_PROVIDER_SITE_OTHER): Payer: BLUE CROSS/BLUE SHIELD | Admitting: Family Medicine

## 2015-12-25 ENCOUNTER — Encounter: Payer: Self-pay | Admitting: Family Medicine

## 2015-12-25 VITALS — BP 110/78 | HR 76 | Temp 98.4°F | Ht 62.5 in | Wt 129.2 lb

## 2015-12-25 DIAGNOSIS — J0100 Acute maxillary sinusitis, unspecified: Secondary | ICD-10-CM | POA: Diagnosis not present

## 2015-12-25 MED ORDER — FLUCONAZOLE 150 MG PO TABS: 150.0000 mg | ORAL_TABLET | Freq: Once | ORAL | 0 refills | Status: AC

## 2015-12-25 MED ORDER — AMOXICILLIN-POT CLAVULANATE 875-125 MG PO TABS: 1.0000 | ORAL_TABLET | Freq: Two times a day (BID) | ORAL | 0 refills | Status: AC

## 2015-12-25 MED ORDER — HYDROCODONE-HOMATROPINE 5-1.5 MG/5ML PO SYRP: ORAL_SOLUTION | ORAL | 0 refills | Status: DC

## 2015-12-25 NOTE — Progress Notes (Signed)
Pre visit review using our clinic review tool, if applicable. No additional management support is needed unless otherwise documented below in the visit note. 

## 2015-12-25 NOTE — Progress Notes (Signed)
Dr. Frederico Hamman T. Joran Kallal, MD, [facility] [place], [address] Phone: [phone] Fax: [phone]  12/25/2015  Patient: Isabel Foley, MRN: [id number], DOB: [birth year], 45 y.o.  Primary Physician:  Eliezer Lofts, MD   Chief Complaint  Patient presents with  . Sinusitis  . Fever  . Ear Pain    Right  . Cough    at night from drainage   Subjective:   Isabel Foley is a 45 y.o. very pleasant female patient who presents with the following:  Sinus infection and came back 2 weeks ago, had a cold, then came church, woke up with a fever for 5 days. Took 2 motrin earlier. Pain on the front of face.   Overall, the patient has been sick for about 3 or 4 weeks.  She also has 2 daughters at home but had URI type symptoms which resolved less than a week.  The patient does have a deviated septum.  She has had recurrent sinusitis.  She has had fevers off and on for about the last 5 days.  She did take 2 ibuprofen prior to coming to our office.  She also has a significant cough at nighttime and significant postnasal drainage.  Past Medical History, Surgical History, Social History, Family History, Problem List, Medications, and Allergies have been reviewed and updated if relevant.  Patient Active Problem List   Diagnosis Date Noted  . Pruritus 10/20/2015  . Acute back pain with sciatica 03/21/2015  . Urinary frequency 12/27/2014  . Decreased hearing of both ears 11/22/2014  . OTHER CHRONIC SINUSITIS 12/12/2008  . GOITER, MULTINODULAR 09/08/2008  . History of hypothyroidism 09/08/2008  . HYPERCHOLESTEROLEMIA 09/08/2008  . Depression, major, in remission ([place]) 09/08/2008  . Allergic rhinitis 09/08/2008  . GERD 09/08/2008  . PEPTIC ULCER DISEASE 09/08/2008  . Common migraine 09/08/2008    Past Medical History:  Diagnosis Date  . Abdominal pain, right upper quadrant   . Allergic rhinitis, cause unspecified   . Depressive  disorder, not elsewhere classified   . Dizziness and giddiness   . Esophageal reflux   . Nontoxic multinodular goiter   . Other chronic sinusitis   . Other malaise and fatigue   . Pain in limb   . Peptic ulcer, unspecified site, unspecified as acute or chronic, without mention of hemorrhage, perforation, or obstruction   . Premenstrual tension syndromes   . Pure hypercholesterolemia   . Unspecified hypothyroidism     Past Surgical History:  Procedure Laterality Date  . BREAST BIOPSY Right 06/01/2015   stereo, path pending  . DILATION AND CURETTAGE OF UTERUS  2002  . LEEP  2000    Social History   Social History  . Marital status: Married    Spouse name: N/A  . Number of children: 7  . Years of education: N/A   Occupational History  . Stay at home mom    Social History Main Topics  . Smoking status: Never Smoker  . Smokeless tobacco: Never Used  . Alcohol use No  . Drug use: No  . Sexual activity: Not on file   Other Topics Concern  . Not on file   Social History Narrative   7 children; 6 biologic.Marland KitchenMarland KitchenADHD      Married; previously divorced      Stay at home mom      Regular exercise-yes, daily aerobics, treadmill      Diet: Skips meals, limited veggies; ice cream in  AM    Family History  Problem Relation Age of Onset  . Hypertension Father   . Hypothyroidism Father   . Atrial fibrillation Brother 20  . Other Sister     Collagen Vascular Disease  . Breast cancer Neg Hx     No Known Allergies  Medication list reviewed and updated in full in [facility].  ROS: GEN: Acute illness details above GI: Tolerating PO intake GU: maintaining adequate hydration and urination Pulm: No SOB Interactive and getting along well at home.  Otherwise, ROS is as per the HPI.  Objective:   BP 110/78   Pulse 76   Temp 98.4 F (36.9 C) (Oral)   Ht 5' 2.5" (1.588 m)   Wt 129 lb 4 oz (58.6 kg)   LMP 12/11/2015 (Approximate)   BMI 23.26 kg/m    Gen: WDWN,  NAD; alert,appropriate and cooperative throughout exam    HEENT: Normocephalic and atraumatic. Throat clear, w/o exudate, no LAD, R TM clear, L TM - good landmarks, No fluid present. rhinnorhea.  Left frontal and maxillary sinuses: Tender, mostly max Right frontal and maxillary sinuses: Tender, mostly max   Neck: No ant or post LAD  CV: RRR, No M/G/R  Pulm: Breathing comfortably in no resp distress. no w/c/r  Abd: S,NT,ND,+BS Extr: no c/c/e Psych: full affect, pleasant    Laboratory and Imaging Data:  Assessment and Plan:   Acute maxillary sinusitis, recurrence not specified  Preceding viral illness in a patient with a deviated septum with recurrent sinusitis in the past, also with ongoing cough secondary to postnasal issues.  Greater than 3 weeks of symptoms with maxillary sinusitis.  Follow-up: No Follow-up on file.  Meds ordered this encounter  Medications  . amoxicillin-clavulanate (AUGMENTIN) 875-125 MG tablet    Sig: Take 1 tablet by mouth 2 (two) times daily.    Dispense:  20 tablet    Refill:  0  . fluconazole (DIFLUCAN) 150 MG tablet    Sig: Take 1 tablet (150 mg total) by mouth once.    Dispense:  1 tablet    Refill:  0  . HYDROcodone-homatropine (HYCODAN) 5-1.5 MG/5ML syrup    Sig: 1 tsp po at night before bed prn cough    Dispense:  180 mL    Refill:  0   There are no discontinued medications. No orders of the defined types were placed in this encounter.   Signed,  Maud Deed. Oriyah Lamphear, MD   Allergies as of 12/25/2015   No Known Allergies     Medication List       Accurate as of 12/25/15 11:59 PM. Always use your most recent med list.          ALPRAZolam 0.25 MG tablet Commonly known as:  XANAX Can use  1 tab daily  as needed for anxiety on long trips.   amoxicillin-clavulanate 875-125 MG tablet Commonly known as:  AUGMENTIN Take 1 tablet by mouth 2 (two) times daily.   cetirizine 10 MG tablet Commonly known as:  ZYRTEC Take 10 mg by  mouth daily.   cyclobenzaprine 10 MG tablet Commonly known as:  FLEXERIL TAKE 1 TABLET (10 MG TOTAL) BY MOUTH AT BEDTIME.   diclofenac 75 MG EC tablet Commonly known as:  VOLTAREN Take 1 tablet (75 mg total) by mouth 2 (two) times daily.   fluconazole 150 MG tablet Commonly known as:  DIFLUCAN Take 1 tablet (150 mg total) by mouth once.   HYDROcodone-homatropine 5-1.5 MG/5ML syrup Commonly  known as:  HYCODAN 1 tsp po at night before bed prn cough   mometasone 50 MCG/ACT nasal spray Commonly known as:  NASONEX Place 2 sprays into the nose daily.   montelukast 10 MG tablet Commonly known as:  SINGULAIR Take 10 mg by mouth at bedtime.   multivitamin tablet Take 1 tablet by mouth daily.   PROBIOTIC FORMULA PO Take 1 tablet by mouth daily.   rizatriptan 10 MG tablet Commonly known as:  MAXALT Take 1 tablet (10 mg total) by mouth as needed for migraine. May repeat in 2 hours if needed   topiramate 25 MG tablet Commonly known as:  TOPAMAX Take 1 tablet (25 mg total) by mouth at bedtime as needed.   tranexamic acid 650 MG Tabs tablet Commonly known as:  LYSTEDA Take 2 tablets (1,300 mg total) by mouth 3 (three) times daily. Take during menses for a maximum of five days

## 2016-01-13 ENCOUNTER — Other Ambulatory Visit: Payer: Self-pay | Admitting: Obstetrics & Gynecology

## 2016-01-13 DIAGNOSIS — N920 Excessive and frequent menstruation with regular cycle: Secondary | ICD-10-CM

## 2016-02-14 ENCOUNTER — Ambulatory Visit (INDEPENDENT_AMBULATORY_CARE_PROVIDER_SITE_OTHER): Payer: BLUE CROSS/BLUE SHIELD | Admitting: Family Medicine

## 2016-02-14 ENCOUNTER — Telehealth: Payer: Self-pay | Admitting: Family Medicine

## 2016-02-14 ENCOUNTER — Encounter: Payer: Self-pay | Admitting: Family Medicine

## 2016-02-14 VITALS — BP 110/62 | HR 105 | Temp 98.7°F | Resp 16 | Ht 62.5 in | Wt 128.8 lb

## 2016-02-14 DIAGNOSIS — R509 Fever, unspecified: Secondary | ICD-10-CM

## 2016-02-14 DIAGNOSIS — R69 Illness, unspecified: Secondary | ICD-10-CM

## 2016-02-14 DIAGNOSIS — J111 Influenza due to unidentified influenza virus with other respiratory manifestations: Secondary | ICD-10-CM | POA: Insufficient documentation

## 2016-02-14 LAB — POCT INFLUENZA A/B
INFLUENZA A, POC: NEGATIVE
INFLUENZA B, POC: NEGATIVE

## 2016-02-14 NOTE — Patient Instructions (Addendum)
Flu swab was negative  I think you have flu like illness on top of sinusitis.  Lots of hand washing, cover mouth with coughing to prevent spread.  For pleurisy - continue motrin 600mg  with meals for next 5 days.  Come in for xray if cough worsens. Return for recheck if persistent fever or worsening symptoms.

## 2016-02-14 NOTE — Progress Notes (Signed)
Pre-visit discussion using our clinic review tool. No additional management support is needed unless otherwise documented below in the visit note.  

## 2016-02-14 NOTE — Telephone Encounter (Signed)
Patient Name: Isabel Foley DOB: Nov 01, 1970 Initial Comment caller states she has fever 99.1, chills, body aches and chest pain Nurse Assessment Nurse: Richardson Landry, RN, [place] Date/Time (Eastern Time): 02/14/2016 9:16:24 AM Confirm and document reason for call. If symptomatic, describe symptoms. ---Caller states she had appt with allergist yesterday and she thought she still had a sinus infection and gave pt abx. Caller states chest pains have been waking from sleep. Pt has hx of pleurisy pain but it is usually in the back. Pt began taking Cefdinir 300mg  bid x10 days. Does the patient have any new or worsening symptoms? ---Yes Will a triage be completed? ---Yes Related visit to physician within the last 2 weeks? ---Yes Does the PT have any chronic conditions? (i.e. diabetes, asthma, etc.) ---Yes List chronic conditions. ---allergy induced asthma, breathing test was 64-65 yesterday. Is the patient pregnant or possibly pregnant? (Ask all females between the ages of 49-55) ---No Is this a behavioral health or substance abuse call? ---No Guidelines Guideline Title Affirmed Question Affirmed Notes Chest Pain Taking a deep breath makes pain worse Final Disposition User Go to ED Now (or PCP triage) Richardson Landry, RN, Samantha Comments RN doesn't feel waiting for appt is appropriate, RN referred pt to go in to ED for care now/ within the hour. Caller refused to go to ED and requested an appt at 1130. Referrals GO TO FACILITY REFUSED Disagree/Comply: Disagree Disagree/Comply Reason: Disagree with instructions

## 2016-02-14 NOTE — Telephone Encounter (Signed)
Pt has appt with Dr Darnell Level 02/14/16 at 12 noon.

## 2016-02-14 NOTE — Telephone Encounter (Signed)
Seen today. 

## 2016-02-14 NOTE — Progress Notes (Signed)
BP 110/62   Pulse (!) 105   Temp 98.7 F (37.1 C) (Oral)   Resp 16   Ht 5' 2.5" (1.588 m)   Wt 128 lb 12 oz (58.4 kg)   LMP 02/09/2016 (Within Weeks)   SpO2 95%   BMI 23.17 kg/m    CC: chest pain Subjective:    Patient ID: Isabel Foley, female    DOB: 1970/10/10, 46 y.o.   MRN: [id number]  HPI: Isabel Foley is a 47 y.o. female presenting on 02/14/2016 for Fever (hot and cold flashes, chills, chest pain when taking deep breath, radiates to back, body ache, coughing started yesterday in the evening after Dr. Visit.)   See today's phone note. Seen yesterday by allergist Dr Remus Blake with dx sinusitis and treated with cefdinir. Called our nurse line with chest discomfort waking her up from sleep, h/o pleurisy. Team health recommended ER evaluation, pt refused. Here as same day add on.   Last night acute onset of fever to 103, chills, body aches, R sided chest pain with deep breath as well as chronic left pleurisy. Mild cough and hoarse voice. sxs started prior to starting cefdinir.   Treating with tylenol flu, motrin 600mg .   No HA, ST, PNdrainage. No ear or tooth pain. No dyspnea, no wheezing, no productive cough.   She did not receive flu shot this year.  No sick contacts at home.   H/o allergy induced asthma. Dust exposure at home - undergoing renovations.  Last seen here 12/24/2016 with acute sinusitis, treated with augmentin 10d course.   Relevant past medical, surgical, family and social history reviewed and updated as indicated. Interim medical history since our last visit reviewed. Allergies and medications reviewed and updated. Current Outpatient Prescriptions on File Prior to Visit  Medication Sig  . ALPRAZolam (XANAX) 0.25 MG tablet Can use  1 tab daily  as needed for anxiety on long trips.  . cetirizine (ZYRTEC) 10 MG tablet Take 10 mg by mouth daily.  . cyclobenzaprine (FLEXERIL) 10 MG tablet TAKE 1 TABLET (10 MG TOTAL) BY MOUTH AT BEDTIME.  . mometasone (NASONEX)  50 MCG/ACT nasal spray Place 2 sprays into the nose daily.  . montelukast (SINGULAIR) 10 MG tablet Take 10 mg by mouth at bedtime.    . Multiple Vitamin (MULTIVITAMIN) tablet Take 1 tablet by mouth daily.  . Probiotic Product (PROBIOTIC FORMULA PO) Take 1 tablet by mouth daily.  . rizatriptan (MAXALT) 10 MG tablet Take 1 tablet (10 mg total) by mouth as needed for migraine. May repeat in 2 hours if needed  . topiramate (TOPAMAX) 25 MG tablet Take 1 tablet (25 mg total) by mouth at bedtime as needed.  . tranexamic acid (LYSTEDA) 650 MG TABS tablet TAKE 2 TABLETS BY MOUTH 3 TIMES DAILY. TAKE DURING MENSES FOR MAXIMUM OF 5 DAYS  . diclofenac (VOLTAREN) 75 MG EC tablet Take 1 tablet (75 mg total) by mouth 2 (two) times daily. (Patient not taking: Reported on 02/14/2016)  . HYDROcodone-homatropine (HYCODAN) 5-1.5 MG/5ML syrup 1 tsp po at night before bed prn cough (Patient not taking: Reported on 02/14/2016)   No current facility-administered medications on file prior to visit.     Review of Systems Per HPI unless specifically indicated in ROS section     Objective:    BP 110/62   Pulse (!) 105   Temp 98.7 F (37.1 C) (Oral)   Resp 16   Ht 5' 2.5" (1.588 m)   Wt 128 lb 12 oz (  58.4 kg)   LMP 02/09/2016 (Within Weeks)   SpO2 95%   BMI 23.17 kg/m   Wt Readings from Last 3 Encounters:  02/14/16 128 lb 12 oz (58.4 kg)  12/25/15 129 lb 4 oz (58.6 kg)  10/20/15 130 lb 8 oz (59.2 kg)    Physical Exam  Constitutional: She appears well-developed and well-nourished. No distress.  HENT:  Head: Normocephalic and atraumatic.  Right Ear: Hearing, tympanic membrane, external ear and ear canal normal.  Left Ear: Hearing, tympanic membrane, external ear and ear canal normal.  Nose: No mucosal edema or rhinorrhea. Right sinus exhibits no maxillary sinus tenderness and no frontal sinus tenderness. Left sinus exhibits no maxillary sinus tenderness and no frontal sinus tenderness.  Mouth/Throat: Uvula is  midline, oropharynx is clear and moist and mucous membranes are normal. No oropharyngeal exudate, posterior oropharyngeal edema, posterior oropharyngeal erythema or tonsillar abscesses.  Eyes: Conjunctivae and EOM are normal. Pupils are equal, round, and reactive to light. No scleral icterus.  Neck: Normal range of motion. Neck supple.  Cardiovascular: Normal rate, regular rhythm, normal heart sounds and intact distal pulses.   No murmur heard. Pulmonary/Chest: Effort normal and breath sounds normal. No respiratory distress. She has no wheezes. She has no rales.  Lungs clear  Lymphadenopathy:    She has no cervical adenopathy.  Skin: Skin is warm and dry. No rash noted.  Nursing note and vitals reviewed.  Results for orders placed or performed in visit on 02/14/16  POCT Influenza A/B  Result Value Ref Range   Influenza A, POC Negative Negative   Influenza B, POC Negative Negative      Assessment & Plan:   Problem List Items Addressed This Visit    Influenza-like illness - Primary    Story most consistent with influenza - despite negative flu swab. Likely flu like illness in setting of sinusitis, anticipate pleurisy from current illness - treat with ibuprofen. Supportive care reviewed with patient.  Offered return for xray if worsening productive cough (currently minimal cough, lungs clear).  rec return for re evaluation if worsening symptoms or persistent fever past 5 days.  Pt agrees with plan.        Other Visit Diagnoses    Fever, unspecified fever cause       Relevant Orders   POCT Influenza A/B (Completed)       Follow up plan: Return if symptoms worsen or fail to improve.  Ria Bush, MD

## 2016-02-14 NOTE — Assessment & Plan Note (Addendum)
Story most consistent with influenza - despite negative flu swab. Likely flu like illness in setting of sinusitis, anticipate pleurisy from current illness - treat with ibuprofen. Supportive care reviewed with patient.  Offered return for xray if worsening productive cough (currently minimal cough, lungs clear).  rec return for re evaluation if worsening symptoms or persistent fever past 5 days.  Pt agrees with plan.

## 2016-06-05 ENCOUNTER — Telehealth: Payer: Self-pay | Admitting: Family Medicine

## 2016-06-05 NOTE — Telephone Encounter (Signed)
Please call patient back and get her juror number and why she can't sit for jury duty.

## 2016-06-05 NOTE — Telephone Encounter (Signed)
Pt's Juror number is [id number]. She states the reason is due to her anxiety and back pain.

## 2016-06-05 NOTE — Telephone Encounter (Signed)
Pt called requesting note to excuse her from jury duty. Can reach her on [phone].

## 2016-06-06 ENCOUNTER — Encounter: Payer: Self-pay | Admitting: Family Medicine

## 2016-06-06 NOTE — Telephone Encounter (Signed)
I have not seen this patient for back pain since 03/2015.  Only anxiety treated in past in 03/2015 is  situational anxiety on long trips.  I am not prescribing meds for these issues at this time.  These are not appropriate reasons for not performing jury duty. If she is seeing other MDs for treatment of these issues.. She can have them consider  these issues qualify for recusing her from jury duty.  If she feels these issues are poorly controlled.. She can make an appt here to have them treated.

## 2016-06-06 NOTE — Progress Notes (Signed)
I have not seen this patient for back pain since 03/2015.  Only anxiety treated in past in 03/2015 is  situational anxiety on long trips.  I am not prescribing meds for these issues at this time.  These are not appropriate reasons for not performing jury duty. If she is seeing other MDs for treatment of these issues.. She can have them consider  these issues qualify for recusing her from jury duty.  If she feels these issues are poorly controlled.. She can make an appt here to have them treated.

## 2016-06-06 NOTE — Telephone Encounter (Signed)
Joelene Millin notified as instructed by telephone.  Appointment scheduled for 06/11/2016 at 9:30 am with Dr. Diona Browner to discuss her anxiety.

## 2016-06-11 ENCOUNTER — Ambulatory Visit (INDEPENDENT_AMBULATORY_CARE_PROVIDER_SITE_OTHER): Payer: BLUE CROSS/BLUE SHIELD | Admitting: Family Medicine

## 2016-06-11 ENCOUNTER — Encounter: Payer: Self-pay | Admitting: Family Medicine

## 2016-06-11 DIAGNOSIS — F325 Major depressive disorder, single episode, in full remission: Secondary | ICD-10-CM

## 2016-06-11 DIAGNOSIS — F411 Generalized anxiety disorder: Secondary | ICD-10-CM | POA: Diagnosis not present

## 2016-06-11 MED ORDER — SERTRALINE HCL 25 MG PO TABS: 25.0000 mg | ORAL_TABLET | Freq: Every day | ORAL | 5 refills | Status: DC

## 2016-06-11 NOTE — Assessment & Plan Note (Signed)
No current issue with depression.
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
## 2016-06-11 NOTE — Assessment & Plan Note (Signed)
Poor control. Start low dose sertraline... Follow up in 4 weeks.. Will likely need increase to 40 mg daily.

## 2016-06-11 NOTE — Patient Instructions (Addendum)
Start sertraline 25 mg daily at bedtime.  Follow up in 1 month for re-eval mood. We will call when jury duty note ready.

## 2016-06-14 ENCOUNTER — Encounter: Payer: Self-pay | Admitting: Family Medicine

## 2016-06-14 NOTE — Progress Notes (Signed)
Butch Penny.. I did not have her date of JUry duty.. pleae add this to the letter and print and stamp or leave for me to sign.

## 2016-06-15 ENCOUNTER — Encounter: Payer: Self-pay | Admitting: *Deleted

## 2016-06-18 ENCOUNTER — Other Ambulatory Visit: Payer: Self-pay | Admitting: Family Medicine

## 2016-07-09 ENCOUNTER — Encounter: Payer: Self-pay | Admitting: Family Medicine

## 2016-07-09 ENCOUNTER — Ambulatory Visit (INDEPENDENT_AMBULATORY_CARE_PROVIDER_SITE_OTHER): Payer: BLUE CROSS/BLUE SHIELD | Admitting: Family Medicine

## 2016-07-09 ENCOUNTER — Telehealth: Payer: Self-pay

## 2016-07-09 VITALS — BP 100/72 | HR 77 | Temp 99.0°F | Ht 62.5 in | Wt 128.8 lb

## 2016-07-09 DIAGNOSIS — M25562 Pain in left knee: Secondary | ICD-10-CM

## 2016-07-09 DIAGNOSIS — F411 Generalized anxiety disorder: Secondary | ICD-10-CM

## 2016-07-09 DIAGNOSIS — M7062 Trochanteric bursitis, left hip: Secondary | ICD-10-CM | POA: Diagnosis not present

## 2016-07-09 DIAGNOSIS — G8929 Other chronic pain: Secondary | ICD-10-CM | POA: Diagnosis not present

## 2016-07-09 MED ORDER — VENLAFAXINE HCL ER 37.5 MG PO TB24: ORAL_TABLET | ORAL | 3 refills | Status: DC

## 2016-07-09 MED ORDER — DICLOFENAC SODIUM 75 MG PO TBEC: 75.0000 mg | DELAYED_RELEASE_TABLET | Freq: Two times a day (BID) | ORAL | 0 refills | Status: DC

## 2016-07-09 NOTE — Assessment & Plan Note (Signed)
Refer to ortho for consideration of injection in bursa,.  Start oral diclofenac BID prn.  Start home PT, info given .

## 2016-07-09 NOTE — Telephone Encounter (Signed)
Pt said [facility] needs order to d/c sertraline so pt will get reimbursed for copay. I spoke with Cristie Hem at [facility] and he will d/c sertraline rx and pt will be reimbursed. Pt voiced understanding.

## 2016-07-09 NOTE — Assessment & Plan Note (Signed)
Inadequate control. SE to sertraline.. change to venlafaxine low dose.. Follow up in 4 weeks.  Take in AM to avoid sleep disruption.

## 2016-07-09 NOTE — Patient Instructions (Addendum)
Stop sertraline and change to low dose venlafaxine 37.5 mg. Try taking in morning on full stomach.   Please stop at the  [place] for referral to [facility].  Stop ibuprofen and use diclofenac 75 mg twice daily. Start home PT.

## 2016-07-09 NOTE — Assessment & Plan Note (Signed)
No specific area of pain.Marland Kitchen Possibly due to gait change given hip pain.

## 2016-07-09 NOTE — Progress Notes (Signed)
Subjective:    Patient ID: Isabel Foley, female    DOB: [birth year], 46 y.o.   MRN: [id number]  HPI    46 year old female presents for follow up GAD as well as chronic onset left knee and left hip pain.  At last OV started on sertraline  25 mg daily GAD7: 19 today down a little from 21 PHQ9:12 today.. Stable at 12.  She reports some mild improvement in mood but minimal. She has noted a significant worsening of sleep and nausea after taking  She is taking the medication at bedtime.  She is interested in trying a different medication.  She is too busy and overwhelmed to do counseling.   Faimly history of GAD.  Left knee  and hip pain: She has left hip and left knee  pain on and off for years.. Worse in last few months, flares off and on. Pain lateral hip over bursa. Generalized knee pain, no swelling no redness. No new injury and fall. Hx of fall years ago, broke tailbone. She has been seeing chiropractor. Has not helped much. She is doing water exercise.Marland Kitchen Helps some.  She is using 600 mg motrin in AM, occ repeats later in day.        Review of Systems  Constitutional: Negative for fatigue and fever.  HENT: Negative for congestion and ear pain.   Eyes: Negative for pain.  Respiratory: Negative for cough, chest tightness and shortness of breath.   Cardiovascular: Negative for chest pain, palpitations and leg swelling.  Gastrointestinal: Negative for abdominal pain.  Genitourinary: Negative for dysuria and vaginal bleeding.  Musculoskeletal: Negative for back pain.  Neurological: Negative for syncope, light-headedness and headaches.  Psychiatric/Behavioral: Negative for dysphoric mood. The patient is nervous/anxious.        Objective:   Physical Exam  Constitutional: Vital signs are normal. She appears well-developed and well-nourished. She is cooperative.  Non-toxic appearance. She does not appear ill. No distress.  HENT:  Head: Normocephalic.  Right Ear: Hearing,  tympanic membrane, external ear and ear canal normal.  Left Ear: Hearing, tympanic membrane, external ear and ear canal normal.  Nose: Nose normal.  Eyes: Conjunctivae, EOM and lids are normal. Pupils are equal, round, and reactive to light. Lids are everted and swept, no foreign bodies found.  Neck: Trachea normal and normal range of motion. Neck supple. Carotid bruit is not present. No thyroid mass and no thyromegaly present.  Cardiovascular: Normal rate, regular rhythm, S1 normal, S2 normal, normal heart sounds and intact distal pulses.  Exam reveals no gallop.   No murmur heard. Pulmonary/Chest: Effort normal and breath sounds normal. No respiratory distress. She has no wheezes. She has no rhonchi. She has no rales.  Abdominal: Soft. Normal appearance and bowel sounds are normal. She exhibits no distension, no fluid wave, no abdominal bruit and no mass. There is no hepatosplenomegaly. There is no tenderness. There is no rebound, no guarding and no CVA tenderness. No hernia.  Musculoskeletal:       Left hip: She exhibits tenderness. She exhibits normal range of motion, normal strength and no bony tenderness.       Left knee: She exhibits normal range of motion, no swelling, no effusion, normal patellar mobility, no bony tenderness, normal meniscus and no MCL laxity. No tenderness found. No medial joint line, no lateral joint line, no MCL, no LCL and no patellar tendon tenderness noted.       Lumbar back: She exhibits normal range of motion,  no tenderness and no bony tenderness.  ttp over left troch bursa. PAin laterally with faber's  neg SLR  Lymphadenopathy:    She has no cervical adenopathy.    She has no axillary adenopathy.  Neurological: She is alert. She has normal strength. She displays no atrophy. No cranial nerve deficit. She exhibits normal muscle tone.  Skin: Skin is warm, dry and intact. No rash noted.  Psychiatric: Her speech is normal and behavior is normal. Judgment normal. Her  mood appears not anxious. Cognition and memory are normal. She does not exhibit a depressed mood.          Assessment & Plan:

## 2016-08-04 ENCOUNTER — Other Ambulatory Visit: Payer: Self-pay | Admitting: Family Medicine

## 2016-08-05 ENCOUNTER — Other Ambulatory Visit: Payer: Self-pay | Admitting: *Deleted

## 2016-08-05 NOTE — Telephone Encounter (Signed)
Last office visit 07/09/16.  Last refilled 07/09/2016 for #30 with no refills.  Ok to refill?

## 2016-08-05 NOTE — Telephone Encounter (Signed)
Patient left a voicemail requesting a refill on Xanax. Patient stated that she uses this for traveling and is getting ready to go out of town.  Last refill 10/20/15 #20 Last office visit 07/09/16

## 2016-08-06 MED ORDER — ALPRAZOLAM 0.25 MG PO TABS: ORAL_TABLET | ORAL | 0 refills | Status: DC

## 2016-08-06 NOTE — Telephone Encounter (Signed)
Rx called to pharmacy as instructed. Patient notified by telephone.

## 2016-08-20 ENCOUNTER — Encounter: Payer: Self-pay | Admitting: Family Medicine

## 2016-08-20 ENCOUNTER — Ambulatory Visit (INDEPENDENT_AMBULATORY_CARE_PROVIDER_SITE_OTHER): Payer: BLUE CROSS/BLUE SHIELD | Admitting: Family Medicine

## 2016-08-20 VITALS — BP 100/68 | HR 78 | Temp 98.4°F | Ht 62.5 in | Wt 129.1 lb

## 2016-08-20 DIAGNOSIS — F411 Generalized anxiety disorder: Secondary | ICD-10-CM

## 2016-08-20 DIAGNOSIS — M7062 Trochanteric bursitis, left hip: Secondary | ICD-10-CM

## 2016-08-20 DIAGNOSIS — R0981 Nasal congestion: Secondary | ICD-10-CM
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
## 2016-08-20 NOTE — Patient Instructions (Addendum)
Trial of increasing venlafaxine to 2 tabs daily.. If improvement in mood and no SE .Marland Kitchen We call refill at higher dose.  Call if interested in counseling.  Continue PT.Marland KitchenMarland KitchenCan use diclofenac twice daily as needed.  Start nasal saline for sinus inflammation and nasal steroid spray.

## 2016-08-20 NOTE — Assessment & Plan Note (Signed)
No sign of infection. Treat with nasal steroid and saline spray.

## 2016-08-20 NOTE — Assessment & Plan Note (Signed)
Continue PT and NSAIDS. Follow up with ORTHO if not improving as expected.

## 2016-08-20 NOTE — Assessment & Plan Note (Signed)
Increase venlafaxine to 75 mg daily. Follow up if not improving as expected. No current depression. Depression screen PHQ 2/9 06/11/2016  Decreased Interest 0  Down, Depressed, Hopeless 0  PHQ - 2 Score 0  Altered sleeping 1  Tired, decreased energy 3  Change in appetite 2  Feeling bad or failure about yourself  3  Trouble concentrating 2  Moving slowly or fidgety/restless 1  Suicidal thoughts 0  PHQ-9 Score 12

## 2016-09-26 ENCOUNTER — Telehealth: Payer: Self-pay

## 2016-09-26 NOTE — Telephone Encounter (Signed)
Pt was seen 08/20/16 and was to cb after taking venlafaxine; pt said the venlafaxine is not working out and pt would like to try different med. Does pt need appt or can something be called in to [facility]. Pt request cb.

## 2016-09-27 MED ORDER — ESCITALOPRAM OXALATE 10 MG PO TABS: 10.0000 mg | ORAL_TABLET | Freq: Every day | ORAL | 5 refills | Status: DC

## 2016-09-27 NOTE — Telephone Encounter (Signed)
Will try trial of lexapro 10 mg daily.. If not improving in 3-4 weeks have pt make follow up appt .

## 2016-09-27 NOTE — Telephone Encounter (Signed)
Please call pt.. I know she had SE to sertraline in past ( what were they?) and is not having benefit from venlafaine.  Has she ever tried lexapro, cymbalta or  buspar

## 2016-09-27 NOTE — Telephone Encounter (Signed)
Patient says she could not sleep while taking sertraline.  She says effexor makes her feel angry.  She feels like she has no patients.

## 2016-09-30 NOTE — Telephone Encounter (Signed)
Joelene Millin notified as instructed by telephone.

## 2016-10-16 ENCOUNTER — Ambulatory Visit (INDEPENDENT_AMBULATORY_CARE_PROVIDER_SITE_OTHER): Payer: BLUE CROSS/BLUE SHIELD | Admitting: Family Medicine

## 2016-10-16 ENCOUNTER — Encounter: Payer: Self-pay | Admitting: Family Medicine

## 2016-10-16 VITALS — BP 110/90 | HR 84 | Temp 98.2°F | Wt 132.8 lb

## 2016-10-16 DIAGNOSIS — H6981 Other specified disorders of Eustachian tube, right ear: Secondary | ICD-10-CM

## 2016-10-16 DIAGNOSIS — M545 Low back pain: Secondary | ICD-10-CM

## 2016-10-16 DIAGNOSIS — N3090 Cystitis, unspecified without hematuria: Secondary | ICD-10-CM

## 2016-10-16 LAB — POCT URINALYSIS DIPSTICK
Bilirubin, UA: NEGATIVE
Blood, UA: NEGATIVE
Glucose, UA: NEGATIVE
Ketones, UA: NEGATIVE
Nitrite, UA: NEGATIVE
Protein, UA: NEGATIVE
Spec Grav, UA: 1.015 (ref 1.010–1.025)
Urobilinogen, UA: 0.2 E.U./dL
pH, UA: 7 (ref 5.0–8.0)

## 2016-10-16 MED ORDER — NITROFURANTOIN MONOHYD MACRO 100 MG PO CAPS: 100.0000 mg | ORAL_CAPSULE | Freq: Two times a day (BID) | ORAL | 0 refills | Status: DC

## 2016-10-16 NOTE — Progress Notes (Signed)
Subjective:    Patient ID: Isabel Foley, female    DOB: [birth year], 46 y.o.   MRN: [id number]  HPI This is a 46 yo female who presents today with recurrent sinus issues, has pain/pressure of right ear, today noticed pain under right eye. Takes advil severe sinus medication, uses flonase some days.   Has had low back pain off and on for several days. Having dysuria, nausea, cloudy urine. No odor.   Past Medical History:  Diagnosis Date  . Abdominal pain, right upper quadrant   . Allergic rhinitis, cause unspecified   . Depressive disorder, not elsewhere classified   . Dizziness and giddiness   . Esophageal reflux   . Nontoxic multinodular goiter   . Other chronic sinusitis   . Other malaise and fatigue   . Pain in limb   . Peptic ulcer, unspecified site, unspecified as acute or chronic, without mention of hemorrhage, perforation, or obstruction   . Premenstrual tension syndromes   . Pure hypercholesterolemia   . Unspecified hypothyroidism    Past Surgical History:  Procedure Laterality Date  . BREAST BIOPSY Right 06/01/2015   stereo, path pending  . DILATION AND CURETTAGE OF UTERUS  2002  . LEEP  2000   Family History  Problem Relation Age of Onset  . Hypertension Father   . Hypothyroidism Father   . Atrial fibrillation Brother 20  . Other Sister        Collagen Vascular Disease  . Breast cancer Neg Hx    Social History  Substance Use Topics  . Smoking status: Never Smoker  . Smokeless tobacco: Never Used  . Alcohol use No      Review of Systems Per HPI    Objective:   Physical Exam  Constitutional: She is oriented to person, place, and time. She appears well-developed and well-nourished. No distress.  HENT:  Head: Normocephalic and atraumatic.  Right Ear: Tympanic membrane, external ear and ear canal normal.  Left Ear: Tympanic membrane, external ear and ear canal normal.  Nose: Mucosal edema present.  Mouth/Throat: Uvula is midline and oropharynx is  clear and moist.  Eyes: Conjunctivae are normal.  Cardiovascular: Normal rate, regular rhythm and normal heart sounds.   Pulmonary/Chest: Effort normal and breath sounds normal.  Abdominal: Soft. Bowel sounds are normal. She exhibits no distension. There is no tenderness. There is no rebound, no guarding and no CVA tenderness.  Neurological: She is alert and oriented to person, place, and time.  Skin: Skin is warm and dry. She is not diaphoretic.  Psychiatric: She has a normal mood and affect. Her behavior is normal. Judgment and thought content normal.  Vitals reviewed.     BP 110/90 (BP Location: Right Arm, Patient Position: Sitting, Cuff Size: Normal)   Pulse 84   Temp 98.2 F (36.8 C) (Oral)   Wt 132 lb 12 oz (60.2 kg)   LMP 10/02/2016   SpO2 97%   BMI 23.89 kg/m      Results for orders placed or performed in visit on 10/16/16  POCT urinalysis dipstick  Result Value Ref Range   Color, UA yellow    Clarity, UA clear    Glucose, UA neg    Bilirubin, UA neg    Ketones, UA neg    Spec Grav, UA 1.015 1.010 - 1.025   Blood, UA neg    pH, UA 7.0 5.0 - 8.0   Protein, UA neg    Urobilinogen, UA 0.2 0.2 or 1.0 E.U./dL  Nitrite, UA neg    Leukocytes, UA Large (3+) (A) Negative    Assessment & Plan:  1. Low back pain, unspecified back pain laterality, unspecified chronicity, with sciatica presence unspecified - POCT urinalysis dipstick - Urine Culture  2. Cystitis - Provided written and verbal information regarding diagnosis and treatment. - nitrofurantoin, macrocrystal-monohydrate, (MACROBID) 100 MG capsule; Take 1 capsule (100 mg total) by mouth 2 (two) times daily.  Dispense: 20 capsule; Refill: 0 - RTC precautions reviewed  3. Dysfunction of right eustachian tube - encouraged her to use flonase daily, can add afrin, sudafed   Clarene Reamer, FNP-BC  [facility], [place]  10/16/2016 4:28 PM

## 2016-10-16 NOTE — Patient Instructions (Signed)
For sinus pressure, ear fullness- afrin nasal spray twice a day for 3 days, continue flonase daily, sudafed in morning and early afternoon  Drink enough water to make your urine light yellow   Urinary Tract Infection, Adult A urinary tract infection (UTI) is an infection of any part of the urinary tract, which includes the kidneys, ureters, bladder, and urethra. These organs make, store, and get rid of urine in the body. UTI can be a bladder infection (cystitis) or kidney infection (pyelonephritis). What are the causes? This infection may be caused by fungi, viruses, or bacteria. Bacteria are the most common cause of UTIs. This condition can also be caused by repeated incomplete emptying of the bladder during urination. What increases the risk? This condition is more likely to develop if:  You ignore your need to urinate or hold urine for long periods of time.  You do not empty your bladder completely during urination.  You wipe back to front after urinating or having a bowel movement, if you are female.  You are uncircumcised, if you are female.  You are constipated.  You have a urinary catheter that stays in place (indwelling).  You have a weak defense (immune) system.  You have a medical condition that affects your bowels, kidneys, or bladder.  You have diabetes.  You take antibiotic medicines frequently or for long periods of time, and the antibiotics no longer work well against certain types of infections (antibiotic resistance).  You take medicines that irritate your urinary tract.  You are exposed to chemicals that irritate your urinary tract.  You are female.  What are the signs or symptoms? Symptoms of this condition include:  Fever.  Frequent urination or passing small amounts of urine frequently.  Needing to urinate urgently.  Pain or burning with urination.  Urine that smells bad or unusual.  Cloudy urine.  Pain in the lower abdomen or back.  Trouble  urinating.  Blood in the urine.  Vomiting or being less hungry than normal.  Diarrhea or abdominal pain.  Vaginal discharge, if you are female.  How is this diagnosed? This condition is diagnosed with a medical history and physical exam. You will also need to provide a urine sample to test your urine. Other tests may be done, including:  Blood tests.  Sexually transmitted disease (STD) testing.  If you have had more than one UTI, a cystoscopy or imaging studies may be done to determine the cause of the infections. How is this treated? Treatment for this condition often includes a combination of two or more of the following:  Antibiotic medicine.  Other medicines to treat less common causes of UTI.  Over-the-counter medicines to treat pain.  Drinking enough water to stay hydrated.  Follow these instructions at home:  Take over-the-counter and prescription medicines only as told by your health care provider.  If you were prescribed an antibiotic, take it as told by your health care provider. Do not stop taking the antibiotic even if you start to feel better.  Avoid alcohol, caffeine, tea, and carbonated beverages. They can irritate your bladder.  Drink enough fluid to keep your urine clear or pale yellow.  Keep all follow-up visits as told by your health care provider. This is important.  Make sure to: ? Empty your bladder often and completely. Do not hold urine for long periods of time. ? Empty your bladder before and after sex. ? Wipe from front to back after a bowel movement if you are female.  Use each tissue one time when you wipe. Contact a health care provider if:  You have back pain.  You have a fever.  You feel nauseous or vomit.  Your symptoms do not get better after 3 days.  Your symptoms go away and then return. Get help right away if:  You have severe back pain or lower abdominal pain.  You are vomiting and cannot keep down any medicines or  water. This information is not intended to replace advice given to you by your health care provider. Make sure you discuss any questions you have with your health care provider. Document Released: 10/03/2004 Document Revised: 06/07/2015 Document Reviewed: 11/14/2014 Elsevier Interactive Patient Education  2017 [employer].

## 2016-10-17 LAB — URINE CULTURE
MICRO NUMBER:: 81129474
SPECIMEN QUALITY:: ADEQUATE

## 2016-10-19 ENCOUNTER — Encounter: Payer: Self-pay | Admitting: Family Medicine

## 2016-10-21 ENCOUNTER — Other Ambulatory Visit: Payer: Self-pay | Admitting: Family Medicine

## 2016-10-21 MED ORDER — FLUCONAZOLE 150 MG PO TABS: 150.0000 mg | ORAL_TABLET | Freq: Once | ORAL | 0 refills | Status: AC

## 2016-10-31 ENCOUNTER — Other Ambulatory Visit: Payer: Self-pay | Admitting: Family Medicine

## 2016-12-05 IMAGING — MG MM BREAST BX W/ LOC DEV 1ST LESION IMAGE BX SPEC STEREO GUIDE*R*
1 series · 8 of 8 positions shown · non-contrast
Comparison: Previous exams.

ADDENDUM:
Pathology of the right breast biopsy revealed BENIGN BREAST TISSUE
WITH COLUMNAR CELL CHANGE AND ASSOCIATED CALCIFICATIONS.
PSEUDOANGIOMATOUS STROMAL HYPERPLASIA. FOCAL SCLEROSING ADENOSIS.
NEGATIVE FOR ATYPIA AND MALIGNANCY. This was found to be concordant
by Dr. Klever impression and notes.

Recommendations:  Return to screening mammography in one year.
At the patient's request, pathology and recommendations were relayed
to the patient by phone. She stated she has done well following the
biopsy with no bleeding, bruising, or hematoma. Post biopsy
instructions were reviewed with the patient. All of her questions
were answered. She was encouraged to call the [REDACTED] or Jumper, Blain ([REDACTED]) with any further questions or concerns.
Pathology and recommendations relayed by Jumper, Blain on
06/06/15.
CLINICAL DATA: Right breast calcifications.
EXAM:
RIGHT BREAST STEREOTACTIC CORE NEEDLE BIOPSY

[Series 1: R · right · 0.10mm/px · 8 of 10 slices shown]
[im 1/10]
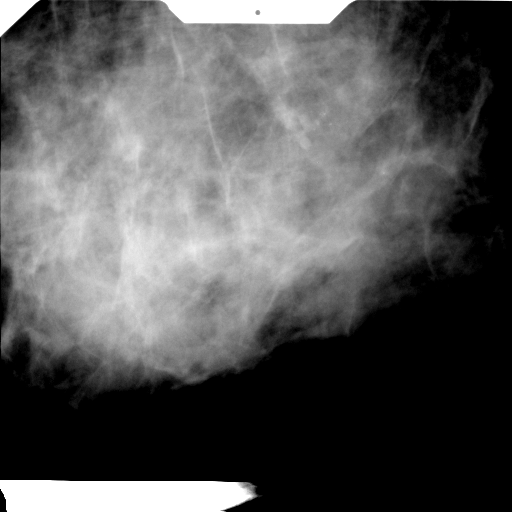
[im 2/10]
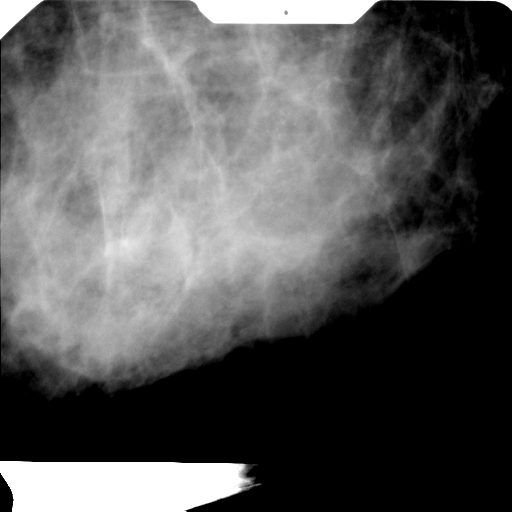
[im 3/10]
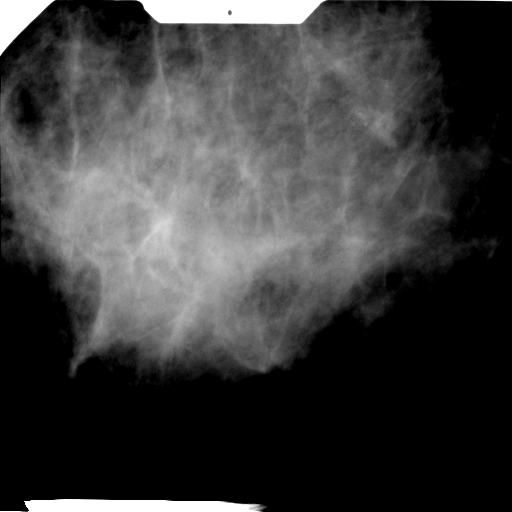
[im 4/10]
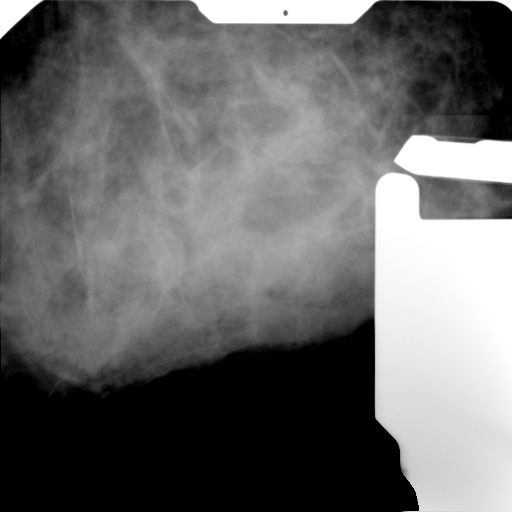
[im 6/10]
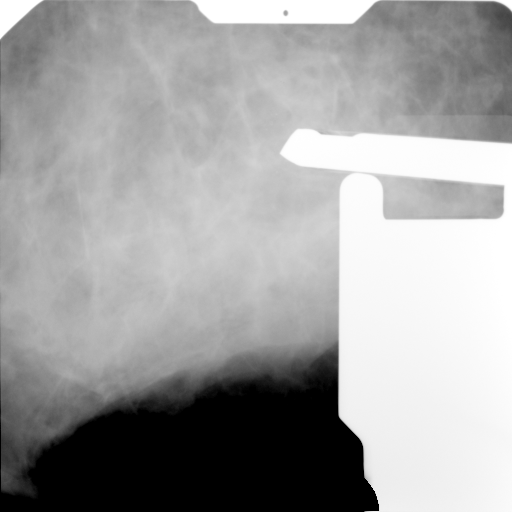
[im 7/10]
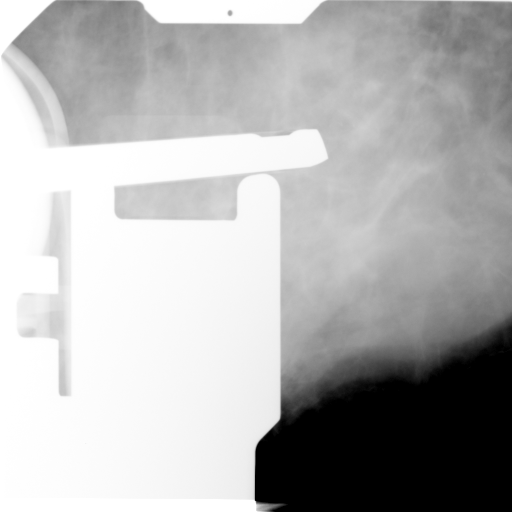
[im 8/10]
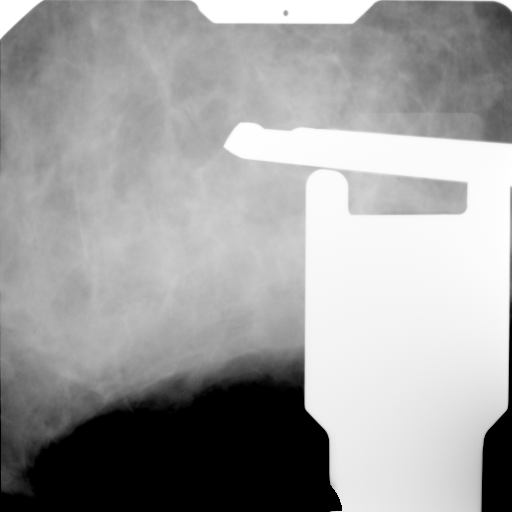
[im 10/10]
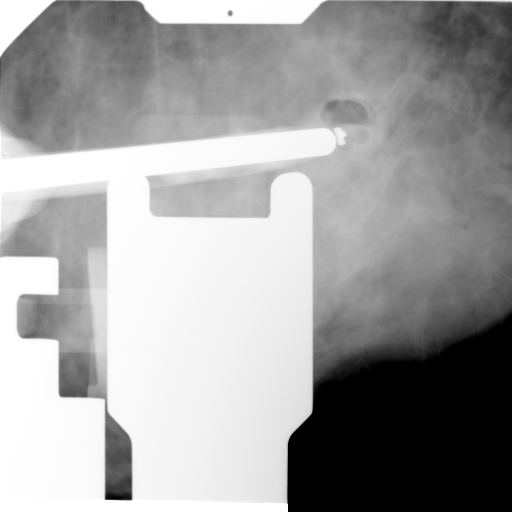

[8 of 8 positions shown; findings below may reference images not displayed]



Using sterile technique and 1% Lidocaine as local anesthetic, under
stereotactic guidance, a 9 gauge vacuum assisted device was used to
perform core needle biopsy of calcifications within the upper-outer
quadrant of the right breast using a lateral approach. Specimen
radiograph was performed showing representative calcifications
within the specimen. Specimens with calcifications are identified
for pathology.

At the conclusion of the procedure, a top hat shaped tissue marker
clip was deployed into the biopsy cavity. Follow-up 2-view mammogram
was performed and dictated separately.
IMPRESSION: Stereotactic-guided biopsy of right breast calcifications as
discussed above. No apparent complications.

## 2017-01-18 ENCOUNTER — Other Ambulatory Visit: Payer: Self-pay | Admitting: Obstetrics & Gynecology

## 2017-01-18 DIAGNOSIS — N920 Excessive and frequent menstruation with regular cycle: Secondary | ICD-10-CM

## 2017-02-07 ENCOUNTER — Encounter: Payer: Self-pay | Admitting: Family Medicine

## 2017-02-07 ENCOUNTER — Ambulatory Visit: Payer: BLUE CROSS/BLUE SHIELD | Admitting: Family Medicine

## 2017-02-07 VITALS — BP 106/64 | HR 71 | Temp 98.4°F | Ht 62.5 in | Wt 133.2 lb

## 2017-02-07 DIAGNOSIS — J0100 Acute maxillary sinusitis, unspecified: Secondary | ICD-10-CM | POA: Insufficient documentation

## 2017-02-07 DIAGNOSIS — J019 Acute sinusitis, unspecified: Secondary | ICD-10-CM | POA: Insufficient documentation

## 2017-02-07 MED ORDER — BENZONATATE 200 MG PO CAPS: 200.0000 mg | ORAL_CAPSULE | Freq: Three times a day (TID) | ORAL | 1 refills | Status: DC | PRN

## 2017-02-07 MED ORDER — AMOXICILLIN-POT CLAVULANATE 875-125 MG PO TABS: 1.0000 | ORAL_TABLET | Freq: Two times a day (BID) | ORAL | 0 refills | Status: DC

## 2017-02-07 NOTE — Patient Instructions (Signed)
Take the augmentin for sinus infection  The cold will still take 1-2 weeks to get better  Drink lots of fluids   Try the tessalon for cough   For chest and head congestion - get mucinex or robitussin    For congestion - get back on flonase daily   For fever- ibuprofen will help

## 2017-02-07 NOTE — Progress Notes (Signed)
Subjective:    Patient ID: Isabel Foley, female    DOB: [birth year], 47 y.o.   MRN: [id number]  HPI Here for uri symptoms   Is prone to sinus problems  Husband brought home a uri    Nasal congestion and runniness Was sneezing  Now congestion  Mucous is thick and slightly yellow /blood   Facial pain --got worse - on Sunday  Some pain in top of mouth   Fever -99.4 to 101  Temp: 98.4 F (36.9 C)  Took motrin today  Cough - dry /hacking  Just a little phlegm / rattling  Worse am/pm   Ear pain - felt full yesterday  She has had OM with rupture in the past so that makes her nervous    Flu shot - did not get one/ does not get them   Patient Active Problem List   Diagnosis Date Noted  . Acute sinusitis 02/07/2017  . Trochanteric bursitis of left hip 07/09/2016  . Chronic pain of left knee 07/09/2016  . GAD (generalized anxiety disorder) 06/11/2016  . Pruritus 10/20/2015  . Acute back pain with sciatica 03/21/2015  . Urinary frequency 12/27/2014  . Decreased hearing of both ears 11/22/2014  . OTHER CHRONIC SINUSITIS 12/12/2008  . GOITER, MULTINODULAR 09/08/2008  . History of hypothyroidism 09/08/2008  . HYPERCHOLESTEROLEMIA 09/08/2008  . Allergic rhinitis 09/08/2008  . GERD 09/08/2008  . PEPTIC ULCER DISEASE 09/08/2008  . Common migraine 09/08/2008   Past Medical History:  Diagnosis Date  . Abdominal pain, right upper quadrant   . Allergic rhinitis, cause unspecified   . Depressive disorder, not elsewhere classified   . Dizziness and giddiness   . Esophageal reflux   . Nontoxic multinodular goiter   . Other chronic sinusitis   . Other malaise and fatigue   . Pain in limb   . Peptic ulcer, unspecified site, unspecified as acute or chronic, without mention of hemorrhage, perforation, or obstruction   . Premenstrual tension syndromes   . Pure hypercholesterolemia   . Unspecified hypothyroidism    Past Surgical History:  Procedure Laterality Date  . BREAST  BIOPSY Right 06/01/2015   stereo, path pending  . DILATION AND CURETTAGE OF UTERUS  2002  . LEEP  2000   Social History   Tobacco Use  . Smoking status: Never Smoker  . Smokeless tobacco: Never Used  Substance Use Topics  . Alcohol use: No  . Drug use: No   Family History  Problem Relation Age of Onset  . Hypertension Father   . Hypothyroidism Father   . Atrial fibrillation Brother 20  . Other Sister        Collagen Vascular Disease  . Breast cancer Neg Hx    No Known Allergies Current Outpatient Medications on File Prior to Visit  Medication Sig Dispense Refill  . ALPRAZolam (XANAX) 0.25 MG tablet Can use  1 tab daily  as needed for anxiety on long trips. 20 tablet 0  . cetirizine (ZYRTEC) 10 MG tablet Take 10 mg by mouth daily.    . cyclobenzaprine (FLEXERIL) 10 MG tablet TAKE 1 TABLET (10 MG TOTAL) BY MOUTH AT BEDTIME. 30 tablet 0  . diclofenac (VOLTAREN) 75 MG EC tablet TAKE 1 TABLET BY MOUTH TWICE A DAY 30 tablet 0  . montelukast (SINGULAIR) 10 MG tablet Take 10 mg by mouth at bedtime.      . Multiple Vitamin (MULTIVITAMIN) tablet Take 1 tablet by mouth daily.    Marland Kitchen PAZEO 0.7 %  SOLN INSTILL 1 DROP INTO BOTH EYES ONCE A DAY AS NEEDED  4  . Probiotic Product (PROBIOTIC FORMULA PO) Take 1 tablet by mouth daily.    . rizatriptan (MAXALT) 10 MG tablet TAKE 1 TABLET (10 MG TOTAL) BY MOUTH AS NEEDED FOR MIGRAINE. MAY REPEAT IN 2 HOURS IF NEEDED 10 tablet 0  . topiramate (TOPAMAX) 25 MG tablet Take 1 tablet (25 mg total) by mouth at bedtime as needed. 30 tablet 3  . tranexamic acid (LYSTEDA) 650 MG TABS tablet TAKE 2 TABLETS BY MOUTH 3 TIMES DAILY. TAKE DURING MENSES FOR MAXIMUM OF 5 DAYS 30 tablet 2  . escitalopram (LEXAPRO) 10 MG tablet Take 1 tablet (10 mg total) by mouth daily. (Patient not taking: Reported on 02/07/2017) 30 tablet 5   No current facility-administered medications on file prior to visit.      Review of Systems  Constitutional: Positive for appetite change.  Negative for fatigue and fever.  HENT: Positive for congestion, ear pain, postnasal drip, rhinorrhea, sinus pressure and sore throat. Negative for nosebleeds.   Eyes: Negative for pain, redness and itching.  Respiratory: Positive for cough. Negative for shortness of breath and wheezing.   Cardiovascular: Negative for chest pain.  Gastrointestinal: Negative for abdominal pain, diarrhea, nausea and vomiting.  Endocrine: Negative for polyuria.  Genitourinary: Negative for dysuria, frequency and urgency.  Musculoskeletal: Negative for arthralgias and myalgias.  Allergic/Immunologic: Negative for immunocompromised state.  Neurological: Positive for headaches. Negative for dizziness, tremors, syncope, weakness and numbness.  Hematological: Negative for adenopathy. Does not bruise/bleed easily.  Psychiatric/Behavioral: Negative for dysphoric mood. The patient is not nervous/anxious.        Objective:   Physical Exam  Constitutional: She appears well-developed and well-nourished. No distress.  Well appearing   HENT:  Head: Normocephalic and atraumatic.  Right Ear: External ear normal.  Left Ear: External ear normal.  Mouth/Throat: Oropharynx is clear and moist. No oropharyngeal exudate.  Nares are injected and congested  Bilateral maxillary sinus tenderness  Post nasal drip   Eyes: Conjunctivae and EOM are normal. Pupils are equal, round, and reactive to light. Right eye exhibits no discharge. Left eye exhibits no discharge.  Neck: Normal range of motion. Neck supple.  Cardiovascular: Normal rate and regular rhythm.  Pulmonary/Chest: Effort normal and breath sounds normal. No respiratory distress. She has no wheezes. She has no rales.  Lymphadenopathy:    She has no cervical adenopathy.  Neurological: She is alert. No cranial nerve deficit.  Skin: Skin is warm and dry. No rash noted.  Psychiatric: She has a normal mood and affect.          Assessment & Plan:   Problem List Items  Addressed This Visit      Respiratory   Acute sinusitis    With viral uri  Cover with augmentin  Disc symptomatic care - see instructions on AVS  Tessalon for cough  Ibuprofen prn pain /pressure  flonase for [employer]  Update if not starting to improve in a week or if worsening        Relevant Medications   amoxicillin-clavulanate (AUGMENTIN) 875-125 MG tablet   benzonatate (TESSALON) 200 MG capsule

## 2017-02-09 NOTE — Assessment & Plan Note (Signed)
With viral uri  Cover with augmentin  Disc symptomatic care - see instructions on AVS  Tessalon for cough  Ibuprofen prn pain /pressure  flonase for [employer]  Update if not starting to improve in a week or if worsening

## 2017-02-28 ENCOUNTER — Encounter: Payer: Self-pay | Admitting: Family Medicine

## 2017-03-03 ENCOUNTER — Encounter: Payer: Self-pay | Admitting: Family Medicine

## 2017-03-03 NOTE — Telephone Encounter (Signed)
Patient would like to pick up the letter this afternoon about the jury duty.  She just needs the date changed. Please call [phone].  She said you can just leave her a message is she can't answer the telephone.

## 2017-03-03 NOTE — Telephone Encounter (Signed)
Please give her copy of jury duty letter as requested on MyChart

## 2017-03-11 ENCOUNTER — Other Ambulatory Visit: Payer: Self-pay | Admitting: Family Medicine

## 2017-03-11 NOTE — Telephone Encounter (Signed)
Last office visit 02/07/2017 with Dr. Glori Bickers.  Last refill 08/06/2016 for #20 with no refills.  Ok to refill?

## 2017-03-12 MED ORDER — ALPRAZOLAM 0.25 MG PO TABS: ORAL_TABLET | ORAL | 0 refills | Status: DC

## 2017-06-13 ENCOUNTER — Ambulatory Visit: Payer: BLUE CROSS/BLUE SHIELD | Admitting: Family Medicine

## 2017-06-13 ENCOUNTER — Encounter: Payer: Self-pay | Admitting: Family Medicine

## 2017-06-13 VITALS — BP 112/80 | HR 88 | Temp 98.5°F | Ht 62.5 in | Wt 129.0 lb

## 2017-06-13 DIAGNOSIS — J029 Acute pharyngitis, unspecified: Secondary | ICD-10-CM | POA: Diagnosis not present

## 2017-06-13 DIAGNOSIS — J069 Acute upper respiratory infection, unspecified: Secondary | ICD-10-CM | POA: Diagnosis not present

## 2017-06-13 DIAGNOSIS — M545 Low back pain: Secondary | ICD-10-CM | POA: Diagnosis not present

## 2017-06-13 LAB — POCT RAPID STREP A (OFFICE): Rapid Strep A Screen: NEGATIVE

## 2017-06-13 MED ORDER — CYCLOBENZAPRINE HCL 10 MG PO TABS: 10.0000 mg | ORAL_TABLET | Freq: Every evening | ORAL | 0 refills | Status: DC | PRN

## 2017-06-13 MED ORDER — BENZONATATE 200 MG PO CAPS: 200.0000 mg | ORAL_CAPSULE | Freq: Three times a day (TID) | ORAL | 1 refills | Status: DC | PRN

## 2017-06-13 NOTE — Progress Notes (Signed)
Subjective:    Patient ID: Isabel Foley, female    DOB: 1970-09-09, 47 y.o.   MRN: [id number]  HPI This is a 47 yo female who presents today with sore throat x 2 days, hoarseness that started this morning. One child at home positive for strep. She feels like she has been battling allergies for a couple of months. Little nasal drainage, increasing post nasal drainage, intermittent headache and ear pain. No cough, but requests benzonate "just in case I get a cough."  Has history of low back pain and is seeing a chiropractor with good results. Requests refill of flexeril for prn use.   Past Medical History:  Diagnosis Date  . Abdominal pain, right upper quadrant   . Allergic rhinitis, cause unspecified   . Depressive disorder, not elsewhere classified   . Dizziness and giddiness   . Esophageal reflux   . Nontoxic multinodular goiter   . Other chronic sinusitis   . Other malaise and fatigue   . Pain in limb   . Peptic ulcer, unspecified site, unspecified as acute or chronic, without mention of hemorrhage, perforation, or obstruction   . Premenstrual tension syndromes   . Pure hypercholesterolemia   . Unspecified hypothyroidism    Past Surgical History:  Procedure Laterality Date  . BREAST BIOPSY Right 06/01/2015   stereo, path pending  . DILATION AND CURETTAGE OF UTERUS  2002  . LEEP  2000   Family History  Problem Relation Age of Onset  . Hypertension Father   . Hypothyroidism Father   . Atrial fibrillation Brother 20  . Other Sister        Collagen Vascular Disease  . Breast cancer Neg Hx    Social History   Tobacco Use  . Smoking status: Never Smoker  . Smokeless tobacco: Never Used  Substance Use Topics  . Alcohol use: No  . Drug use: No      Review of Systems Per HPI    Objective:   Physical Exam  Constitutional: She is oriented to person, place, and time. She appears well-developed and well-nourished.  Sounds hoarse.   HENT:  Head: Normocephalic and  atraumatic.  Right Ear: Hearing and ear canal normal.  Left Ear: Hearing and ear canal normal. Tympanic membrane is scarred.  Nose: Septal deviation (severe) present. Right sinus exhibits no maxillary sinus tenderness and no frontal sinus tenderness. Left sinus exhibits no maxillary sinus tenderness and no frontal sinus tenderness.  Mouth/Throat: Uvula is midline and mucous membranes are normal. Posterior oropharyngeal erythema present. No oropharyngeal exudate or posterior oropharyngeal edema.  Left TM with small bubble.   Cardiovascular: Normal rate, regular rhythm and normal heart sounds.  Pulmonary/Chest: Effort normal and breath sounds normal.  Neurological: She is alert and oriented to person, place, and time.  Skin: Skin is warm and dry.  Psychiatric: She has a normal mood and affect. Her behavior is normal. Judgment and thought content normal.  Vitals reviewed.     BP 112/80 (BP Location: Right Arm, Patient Position: Sitting, Cuff Size: Normal)   Pulse 88   Temp 98.5 F (36.9 C) (Oral)   Ht 5' 2.5" (1.588 m)   Wt 129 lb (58.5 kg)   LMP 06/03/2017   SpO2 97%   BMI 23.22 kg/m      Assessment & Plan:  1. Sore throat - POCT rapid strep A- negative  2. Viral upper respiratory tract infection - Provided written and verbal information regarding diagnosis and treatment. - RTC precautions  reviewed - benzonatate (TESSALON) 200 MG capsule; Take 1 capsule (200 mg total) by mouth 3 (three) times daily as needed for cough. Swallow whole /do not bite pill  Dispense: 30 capsule; Refill: 1  3. Low back pain, unspecified back pain laterality, unspecified chronicity, with sciatica presence unspecified - cyclobenzaprine (FLEXERIL) 10 MG tablet; Take 1 tablet (10 mg total) by mouth at bedtime as needed for muscle spasms.  Dispense: 20 tablet; Refill: 0   Clarene Reamer, FNP-BC  [facility]  06/13/2017 10:37 AM

## 2017-06-13 NOTE — Patient Instructions (Signed)
Good to see you today  I think you have a viral illness.   For nasal congestion you can use Afrin nasal spray for 3 days max, Sudafed, saline nasal spray (generic is fine for all). For cough you can try Delsym. Drink enough fluids to make your urine light yellow. For fever/chill/muscle aches you can take over the counter acetaminophen or ibuprofen.  Please come back in if you are not better in 5-7 days or if you develop wheezing, shortness of breath or persistent vomiting.

## 2017-06-16 ENCOUNTER — Encounter: Payer: Self-pay | Admitting: Family Medicine

## 2017-06-16 ENCOUNTER — Other Ambulatory Visit: Payer: Self-pay | Admitting: Family Medicine

## 2017-06-16 DIAGNOSIS — J0110 Acute frontal sinusitis, unspecified: Secondary | ICD-10-CM

## 2017-06-16 MED ORDER — AMOXICILLIN 875 MG PO TABS: 875.0000 mg | ORAL_TABLET | Freq: Two times a day (BID) | ORAL | 0 refills | Status: DC

## 2017-07-03 ENCOUNTER — Other Ambulatory Visit: Payer: Self-pay | Admitting: Family Medicine

## 2017-07-03 DIAGNOSIS — M545 Low back pain: Secondary | ICD-10-CM

## 2017-07-04 NOTE — Telephone Encounter (Signed)
Seen for an acute problem on 06/13/17 Filled 06/13/17 #20 refills 0

## 2017-07-04 NOTE — Telephone Encounter (Signed)
Please Advise

## 2017-08-25 ENCOUNTER — Other Ambulatory Visit: Payer: Self-pay

## 2017-08-25 NOTE — Telephone Encounter (Signed)
Last office visit 06/13/2017 with Glenda Chroman for sore throat.  Last refilled 03/12/2017 for #20 with no refills.  No future appointments scheduled.  No UDS/Contract on file.  Ok to refill?

## 2017-08-26 MED ORDER — ALPRAZOLAM 0.25 MG PO TABS: ORAL_TABLET | ORAL | 0 refills | Status: DC

## 2017-09-17 DIAGNOSIS — G43001 Migraine without aura, not intractable, with status migrainosus: Secondary | ICD-10-CM | POA: Diagnosis not present

## 2017-09-17 DIAGNOSIS — M9901 Segmental and somatic dysfunction of cervical region: Secondary | ICD-10-CM | POA: Diagnosis not present

## 2017-09-17 DIAGNOSIS — M9903 Segmental and somatic dysfunction of lumbar region: Secondary | ICD-10-CM | POA: Diagnosis not present

## 2017-10-13 ENCOUNTER — Other Ambulatory Visit: Payer: Self-pay | Admitting: Obstetrics and Gynecology

## 2017-10-13 DIAGNOSIS — N920 Excessive and frequent menstruation with regular cycle: Secondary | ICD-10-CM

## 2017-10-20 DIAGNOSIS — G43001 Migraine without aura, not intractable, with status migrainosus: Secondary | ICD-10-CM | POA: Diagnosis not present

## 2017-10-20 DIAGNOSIS — M9901 Segmental and somatic dysfunction of cervical region: Secondary | ICD-10-CM | POA: Diagnosis not present

## 2017-10-20 DIAGNOSIS — M9903 Segmental and somatic dysfunction of lumbar region: Secondary | ICD-10-CM | POA: Diagnosis not present

## 2017-11-26 DIAGNOSIS — M9901 Segmental and somatic dysfunction of cervical region: Secondary | ICD-10-CM | POA: Diagnosis not present

## 2017-11-26 DIAGNOSIS — G43001 Migraine without aura, not intractable, with status migrainosus: Secondary | ICD-10-CM | POA: Diagnosis not present

## 2017-11-26 DIAGNOSIS — M9903 Segmental and somatic dysfunction of lumbar region: Secondary | ICD-10-CM | POA: Diagnosis not present

## 2017-12-16 DIAGNOSIS — M9903 Segmental and somatic dysfunction of lumbar region: Secondary | ICD-10-CM | POA: Diagnosis not present

## 2017-12-16 DIAGNOSIS — M9901 Segmental and somatic dysfunction of cervical region: Secondary | ICD-10-CM | POA: Diagnosis not present

## 2017-12-16 DIAGNOSIS — G43001 Migraine without aura, not intractable, with status migrainosus: Secondary | ICD-10-CM | POA: Diagnosis not present

## 2017-12-25 ENCOUNTER — Other Ambulatory Visit: Payer: Self-pay

## 2017-12-26 MED ORDER — CYCLOBENZAPRINE HCL 10 MG PO TABS: 10.0000 mg | ORAL_TABLET | Freq: Every evening | ORAL | 0 refills | Status: DC | PRN

## 2017-12-26 MED ORDER — ALPRAZOLAM 0.25 MG PO TABS: ORAL_TABLET | ORAL | 0 refills | Status: DC

## 2017-12-26 NOTE — Telephone Encounter (Signed)
Flexeril last filled 07-04-17 #20 Alprazolam last filled 08-26-17 #20 Last OV 06-13-17 No Future OV [facility]

## 2018-01-23 DIAGNOSIS — M9903 Segmental and somatic dysfunction of lumbar region: Secondary | ICD-10-CM | POA: Diagnosis not present

## 2018-01-23 DIAGNOSIS — G43001 Migraine without aura, not intractable, with status migrainosus: Secondary | ICD-10-CM | POA: Diagnosis not present

## 2018-01-23 DIAGNOSIS — M9901 Segmental and somatic dysfunction of cervical region: Secondary | ICD-10-CM | POA: Diagnosis not present

## 2018-02-04 DIAGNOSIS — M9903 Segmental and somatic dysfunction of lumbar region: Secondary | ICD-10-CM | POA: Diagnosis not present

## 2018-02-04 DIAGNOSIS — G43001 Migraine without aura, not intractable, with status migrainosus: Secondary | ICD-10-CM | POA: Diagnosis not present

## 2018-02-04 DIAGNOSIS — M9901 Segmental and somatic dysfunction of cervical region: Secondary | ICD-10-CM | POA: Diagnosis not present

## 2018-03-04 DIAGNOSIS — M9905 Segmental and somatic dysfunction of pelvic region: Secondary | ICD-10-CM | POA: Diagnosis not present

## 2018-03-04 DIAGNOSIS — M9901 Segmental and somatic dysfunction of cervical region: Secondary | ICD-10-CM | POA: Diagnosis not present

## 2018-03-04 DIAGNOSIS — M5412 Radiculopathy, cervical region: Secondary | ICD-10-CM | POA: Diagnosis not present

## 2018-03-19 DIAGNOSIS — M9901 Segmental and somatic dysfunction of cervical region: Secondary | ICD-10-CM | POA: Diagnosis not present

## 2018-03-19 DIAGNOSIS — M9905 Segmental and somatic dysfunction of pelvic region: Secondary | ICD-10-CM | POA: Diagnosis not present

## 2018-03-19 DIAGNOSIS — M5412 Radiculopathy, cervical region: Secondary | ICD-10-CM | POA: Diagnosis not present

## 2018-03-31 DIAGNOSIS — L820 Inflamed seborrheic keratosis: Secondary | ICD-10-CM | POA: Diagnosis not present

## 2018-03-31 DIAGNOSIS — L821 Other seborrheic keratosis: Secondary | ICD-10-CM | POA: Diagnosis not present

## 2018-03-31 DIAGNOSIS — L570 Actinic keratosis: Secondary | ICD-10-CM | POA: Diagnosis not present

## 2018-03-31 DIAGNOSIS — D485 Neoplasm of uncertain behavior of skin: Secondary | ICD-10-CM | POA: Diagnosis not present

## 2018-03-31 DIAGNOSIS — L720 Epidermal cyst: Secondary | ICD-10-CM | POA: Diagnosis not present

## 2018-04-07 DIAGNOSIS — J3089 Other allergic rhinitis: Secondary | ICD-10-CM | POA: Diagnosis not present

## 2018-04-07 DIAGNOSIS — R062 Wheezing: Secondary | ICD-10-CM | POA: Diagnosis not present

## 2018-04-07 DIAGNOSIS — H1045 Other chronic allergic conjunctivitis: Secondary | ICD-10-CM | POA: Diagnosis not present

## 2018-05-18 DIAGNOSIS — L720 Epidermal cyst: Secondary | ICD-10-CM | POA: Diagnosis not present

## 2018-05-18 DIAGNOSIS — L570 Actinic keratosis: Secondary | ICD-10-CM | POA: Diagnosis not present

## 2018-06-24 ENCOUNTER — Other Ambulatory Visit: Payer: Self-pay | Admitting: Family Medicine

## 2018-06-24 NOTE — Telephone Encounter (Signed)
Best number [phone] Pt called needing to get a refill on xantax  [facility]

## 2018-06-24 NOTE — Telephone Encounter (Signed)
Isabel Foley,    Patient has not been seen in over a year.  Please schedule virtual visit with Dr. Diona Browner for Thursday or Friday.

## 2018-06-25 ENCOUNTER — Ambulatory Visit (INDEPENDENT_AMBULATORY_CARE_PROVIDER_SITE_OTHER): Payer: 59 | Admitting: Family Medicine

## 2018-06-25 ENCOUNTER — Encounter: Payer: Self-pay | Admitting: Family Medicine

## 2018-06-25 DIAGNOSIS — F4310 Post-traumatic stress disorder, unspecified: Secondary | ICD-10-CM | POA: Diagnosis not present

## 2018-06-25 DIAGNOSIS — F411 Generalized anxiety disorder: Secondary | ICD-10-CM | POA: Diagnosis not present

## 2018-06-25 DIAGNOSIS — M545 Low back pain, unspecified: Secondary | ICD-10-CM

## 2018-06-25 DIAGNOSIS — G8929 Other chronic pain: Secondary | ICD-10-CM

## 2018-06-25 DIAGNOSIS — G43009 Migraine without aura, not intractable, without status migrainosus: Secondary | ICD-10-CM

## 2018-06-25 DIAGNOSIS — N920 Excessive and frequent menstruation with regular cycle: Secondary | ICD-10-CM | POA: Diagnosis not present

## 2018-06-25 MED ORDER — ALPRAZOLAM 0.25 MG PO TABS: ORAL_TABLET | ORAL | 0 refills | Status: DC

## 2018-06-25 MED ORDER — TRANEXAMIC ACID 650 MG PO TABS: ORAL_TABLET | ORAL | 2 refills | Status: DC

## 2018-06-25 MED ORDER — CYCLOBENZAPRINE HCL 10 MG PO TABS: 10.0000 mg | ORAL_TABLET | Freq: Every evening | ORAL | 0 refills | Status: DC | PRN

## 2018-06-25 MED ORDER — TOPIRAMATE 25 MG PO TABS: 25.0000 mg | ORAL_TABLET | Freq: Every evening | ORAL | 3 refills | Status: DC | PRN

## 2018-06-25 NOTE — Telephone Encounter (Signed)
Virtual Visit scheduled for 06/25/2018 at 4:20 pm with Dr. Diona Browner for medication refill.

## 2018-06-25 NOTE — Progress Notes (Signed)
VIRTUAL VISIT Due to national recommendations of social distancing due to [place] 19, a virtual visit is felt to be most appropriate for this patient at this time.   I connected with the patient on 06/25/18 at  4:20 PM EDT by virtual telehealth platform and verified that I am speaking with the correct person using two identifiers.   I discussed the limitations, risks, security and privacy concerns of performing an evaluation and management service by  virtual telehealth platform and the availability of in person appointments. I also discussed with the patient that there may be a patient responsible charge related to this service. The patient expressed understanding and agreed to proceed.  Patient location: Home Provider Location: [facility] [place] Participants: Eliezer Lofts and [name]   Chief Complaint  Patient presents with  . Medication Refill    Xanax    History of Present Illness:  48 year old female presents for follow up GAD\PTSD  Doing well overall. No longer on lexapro y and using alprazolam prn. Last refilled alprazolam in 12/2017 # 20.. rarely using. Using mainly for travel.   [place] reviewed.. no red flags GAD 7 : Generalized Anxiety Score 06/25/2018 07/09/2016 06/11/2016  Nervous, Anxious, on Edge 1 3 3   Control/stop worrying 1 3 3   Worry too much - different things 1 3 3   Trouble relaxing 1 3 3   Restless 0 2 3  Easily annoyed or irritable 1 2 3   Afraid - awful might happen 1 3 3   Total GAD 7 Score 6 19 21   Anxiety Difficulty Not difficult at all - -   Needs refill of flexeril.. using prn for low back pain.   Migraines well controlled with topamax.. needs refill  Occur < 1 times  Month.. mainly with allergies.  COVID 19 screen No recent travel or known exposure to COVID19 The patient denies respiratory symptoms of COVID 19 at this time.  The importance of social distancing was discussed today.   Review of Systems  Constitutional: Negative for chills and  fever.  HENT: Negative for congestion and ear pain.   Eyes: Negative for pain and redness.  Respiratory: Negative for cough and shortness of breath.   Cardiovascular: Negative for chest pain, palpitations and leg swelling.  Gastrointestinal: Negative for abdominal pain, blood in stool, constipation, diarrhea, nausea and vomiting.  Genitourinary: Negative for dysuria.  Musculoskeletal: Negative for falls and myalgias.  Skin: Negative for rash.  Neurological: Negative for dizziness.  Psychiatric/Behavioral: Negative for depression. The patient is not nervous/anxious.       Past Medical History:  Diagnosis Date  . Abdominal pain, right upper quadrant   . Allergic rhinitis, cause unspecified   . Depressive disorder, not elsewhere classified   . Dizziness and giddiness   . Esophageal reflux   . Nontoxic multinodular goiter   . Other chronic sinusitis   . Other malaise and fatigue   . Pain in limb   . Peptic ulcer, unspecified site, unspecified as acute or chronic, without mention of hemorrhage, perforation, or obstruction   . Premenstrual tension syndromes   . Pure hypercholesterolemia   . Unspecified hypothyroidism     reports that she has never smoked. She has never used smokeless tobacco. She reports that she does not drink alcohol or use drugs.   Current Outpatient Medications:  .  albuterol (PROAIR HFA) 108 (90 Base) MCG/ACT inhaler, ProAir HFA 90 mcg/actuation aerosol inhaler  INHALE 2 PUFFS BY MOUTH EVERY 4-6 HOURS AS NEEDED FOR COUGH/WHEEZE, Disp: ,  Rfl:  .  ALPRAZolam (XANAX) 0.25 MG tablet, Can use  1 tab daily  as needed for anxiety on long trips., Disp: 20 tablet, Rfl: 0 .  ARNUITY ELLIPTA 100 MCG/ACT AEPB, INHALE 1 PUFF BY MOUTH DAILY, Disp: , Rfl: 5 .  azelastine (OPTIVAR) 0.05 % ophthalmic solution, Place 1 drop into both eyes daily as needed., Disp: , Rfl:  .  cyclobenzaprine (FLEXERIL) 10 MG tablet, Take 1 tablet (10 mg total) by mouth at bedtime as needed for muscle  spasms., Disp: 20 tablet, Rfl: 0 .  fexofenadine (ALLEGRA) 180 MG tablet, Take 180 mg by mouth daily as needed., Disp: , Rfl: 5 .  montelukast (SINGULAIR) 10 MG tablet, Take 10 mg by mouth at bedtime.  , Disp: , Rfl:  .  Multiple Vitamin (MULTIVITAMIN) tablet, Take 1 tablet by mouth daily., Disp: , Rfl:  .  Probiotic Product (PROBIOTIC FORMULA PO), Take 1 tablet by mouth daily., Disp: , Rfl:  .  rizatriptan (MAXALT) 10 MG tablet, TAKE 1 TABLET (10 MG TOTAL) BY MOUTH AS NEEDED FOR MIGRAINE. MAY REPEAT IN 2 HOURS IF NEEDED, Disp: 10 tablet, Rfl: 0 .  topiramate (TOPAMAX) 25 MG tablet, Take 1 tablet (25 mg total) by mouth at bedtime as needed., Disp: 30 tablet, Rfl: 3 .  tranexamic acid (LYSTEDA) 650 MG TABS tablet, TAKE 2 TABLETS BY MOUTH 3 TIMES DAILY. TAKE DURING MENSES FOR MAXIMUM OF 5 DAYS, Disp: 30 tablet, Rfl: 2   Observations/Objective: Temperature 98.7 F (37.1 C), temperature source Oral, weight 128 lb 8 oz (58.3 kg), last menstrual period 06/18/2018.  Physical Exam  Physical Exam Constitutional:      General: The patient is not in acute distress. Pulmonary:     Effort: Pulmonary effort is normal. No respiratory distress.  Neurological:     Mental Status: The patient is alert and oriented to person, place, and time.  Psychiatric:        Mood and Affect: Mood normal.        Behavior: Behavior normal.  Assessment and Plan GAD (generalized anxiety disorder) Stable control. No longer using lexapro. Rarely using alprazolam.  PTSD (post-traumatic stress disorder) Seeing a counselor.   Chronic back pain No current pain but recurs. Use flexeril prn.  Common migraine Excellent control on topamax. Reiflled. No SE.     I discussed the assessment and treatment plan with the patient. The patient was provided an opportunity to ask questions and all were answered. The patient agreed with the plan and demonstrated an understanding of the instructions.   The patient was advised to call  back or seek an in-person evaluation if the symptoms worsen or if the condition fails to improve as anticipated.     Eliezer Lofts, MD

## 2018-06-26 NOTE — Assessment & Plan Note (Signed)
Stable control. No longer using lexapro. Rarely using alprazolam.

## 2018-06-26 NOTE — Assessment & Plan Note (Signed)
Excellent control on topamax. Reiflled. No SE.

## 2018-06-26 NOTE — Assessment & Plan Note (Signed)
No current pain but recurs. Use flexeril prn.

## 2018-06-26 NOTE — Assessment & Plan Note (Signed)
Seeing a Social worker.

## 2019-03-29 ENCOUNTER — Other Ambulatory Visit: Payer: Self-pay

## 2019-03-29 MED ORDER — CYCLOBENZAPRINE HCL 10 MG PO TABS: 10.0000 mg | ORAL_TABLET | Freq: Every evening | ORAL | 0 refills | Status: DC | PRN

## 2019-03-29 MED ORDER — ALPRAZOLAM 0.5 MG PO TABS: ORAL_TABLET | ORAL | 0 refills | Status: DC

## 2019-03-29 NOTE — Telephone Encounter (Signed)
Last office visit 06/25/2018 for medication refill.  Last refilled Alprazolam 06/25/2018 for #20 with no refills on both medications.  No future appointments.

## 2019-05-24 ENCOUNTER — Telehealth (INDEPENDENT_AMBULATORY_CARE_PROVIDER_SITE_OTHER): Payer: 59 | Admitting: Family Medicine

## 2019-05-24 ENCOUNTER — Telehealth: Payer: Self-pay

## 2019-05-24 ENCOUNTER — Encounter: Payer: Self-pay | Admitting: Family Medicine

## 2019-05-24 VITALS — Temp 99.9°F

## 2019-05-24 DIAGNOSIS — J069 Acute upper respiratory infection, unspecified: Secondary | ICD-10-CM | POA: Diagnosis not present

## 2019-05-24 NOTE — Progress Notes (Signed)
I connected with [name] on 05/24/19 at  4:20 PM EDT by video and verified that I am speaking with the correct person using two identifiers.   I discussed the limitations, risks, security and privacy concerns of performing an evaluation and management service by video and the availability of in person appointments. I also discussed with the patient that there may be a patient responsible charge related to this service. The patient expressed understanding and agreed to proceed.  Patient location: Home Provider Location: [place] Participants: Lesleigh Noe and [name]   Subjective:     Isabel Foley is a 49 y.o. female presenting for Cough (started on 05/22/19-swelling in sinus area, fever, around 100.1, chills, prod cough with yellow phlegm,and runny nose. )     HPI   #Cough - started 3 days ago - went through the house - started with daughters - husband was given prednisone to prevent sinus infection - daughter and son - had not gotten the covid vaccine  - husband told that it was not  - Takes allergy medications chronically - flonase, azelastine, singulair, zyrtec  - hx of deviate septum and cyst - feels fluid on the left side of the face and some swelling  - deviated septum  - trying some saline spray OTC - cannot do saline rinse - afrin for 3 days - pseudoephdirine  Review of Systems   Social History   Tobacco Use  Smoking Status Never Smoker  Smokeless Tobacco Never Used        Objective:   BP Readings from Last 3 Encounters:  06/13/17 112/80  02/07/17 106/64  10/16/16 110/90   Wt Readings from Last 3 Encounters:  06/25/18 128 lb 8 oz (58.3 kg)  06/13/17 129 lb (58.5 kg)  02/07/17 133 lb 4 oz (60.4 kg)   Temp 99.9 F (37.7 C) Comment: per patient  LMP 05/17/2019    Physical Exam Constitutional:      Appearance: Normal appearance. She is not ill-appearing.  HENT:     Head: Normocephalic and atraumatic.     Right  Ear: External ear normal.     Left Ear: External ear normal.  Eyes:     Conjunctiva/sclera: Conjunctivae normal.  Pulmonary:     Effort: Pulmonary effort is normal. No respiratory distress.  Neurological:     Mental Status: She is alert. Mental status is at baseline.  Psychiatric:        Mood and Affect: Mood normal.        Behavior: Behavior normal.        Thought Content: Thought content normal.        Judgment: Judgment normal.            Assessment & Plan:   Problem List Items Addressed This Visit    None    Visit Diagnoses    Viral URI with cough    -  Primary     Discussed OTC treatment for viral illness Several family members with similar symptoms, but no one has been tested or vaccinated against covid-19. Advised being tested and she mentioned that her husband was told it was a different virus and was given a course of prednisone w/ symptom improvement. She requested prednisone, but discussed that I do not prescribe this for these symptoms. Would recommend watch and wait. Advised covid testing to rule out covid as cause of symptoms. Call back if covid negative and symptoms present x 1 week for consideration for  abx.    Return if symptoms worsen or fail to improve.  Lesleigh Noe, MD

## 2019-05-24 NOTE — Telephone Encounter (Signed)
Pt has inflamed sinus, fever, 100.3, chills, prod cough with yellow phlegm,and runny nose. Pt husband recently had similar symptoms and was given prednisone which worked well. Pt schedule my chart appt with Dr Einar Pheasant today at 4:20. UC & ED precautions given and pt voiced understanding.

## 2019-05-25 NOTE — Telephone Encounter (Signed)
See note from 5/17

## 2019-06-23 ENCOUNTER — Other Ambulatory Visit: Payer: Self-pay

## 2019-06-24 NOTE — Telephone Encounter (Signed)
Last office visit 05/24/2019 with Dr. Christ Kick for URI.  Last refilled 03/29/2019 for #20 with no refills.  No future appointments with PCP.

## 2019-06-25 MED ORDER — ALPRAZOLAM 0.5 MG PO TABS: ORAL_TABLET | ORAL | 0 refills | Status: DC

## 2019-08-05 ENCOUNTER — Encounter: Payer: Self-pay | Admitting: Family Medicine

## 2019-08-05 ENCOUNTER — Other Ambulatory Visit: Payer: Self-pay

## 2019-08-05 ENCOUNTER — Ambulatory Visit (INDEPENDENT_AMBULATORY_CARE_PROVIDER_SITE_OTHER): Payer: 59 | Admitting: Family Medicine

## 2019-08-05 ENCOUNTER — Other Ambulatory Visit (HOSPITAL_COMMUNITY)
Admission: RE | Admit: 2019-08-05 | Discharge: 2019-08-05 | Disposition: A | Discharge status: Home / Self Care | Payer: 59 | Source: Ambulatory Visit | Attending: Family Medicine | Admitting: Family Medicine

## 2019-08-05 VITALS — BP 117/75 | HR 72 | Wt 130.6 lb

## 2019-08-05 DIAGNOSIS — Z124 Encounter for screening for malignant neoplasm of cervix: Secondary | ICD-10-CM | POA: Insufficient documentation

## 2019-08-05 DIAGNOSIS — Z8639 Personal history of other endocrine, nutritional and metabolic disease: Secondary | ICD-10-CM

## 2019-08-05 DIAGNOSIS — Z01419 Encounter for gynecological examination (general) (routine) without abnormal findings: Secondary | ICD-10-CM

## 2019-08-05 DIAGNOSIS — N926 Irregular menstruation, unspecified: Secondary | ICD-10-CM

## 2019-08-05 NOTE — Progress Notes (Signed)
  Subjective:     Isabel Foley is a 49 y.o. female and is here for a comprehensive physical exam. The patient reports problems - somewhat irregular cycles. On Lysteda, which is helping with cycle. Curious if she is near menopause.   The following portions of the patient's history were reviewed and updated as appropriate: allergies, current medications, past family history, past medical history, past social history, past surgical history and problem list.  Review of Systems Pertinent items noted in HPI and remainder of comprehensive ROS otherwise negative.   Objective:    BP 117/75   Pulse 72   Wt 130 lb 9.6 oz (59.2 kg)   LMP 07/06/2019   BMI 23.51 kg/m  General appearance: alert, cooperative and appears stated age Head: Normocephalic, without obvious abnormality, atraumatic Neck: no adenopathy, supple, symmetrical, trachea midline and thyroid not enlarged, symmetric, no tenderness/mass/nodules Lungs: clear to auscultation bilaterally Breasts: normal appearance, no masses or tenderness Heart: regular rate and rhythm, S1, S2 normal, no murmur, click, rub or gallop Abdomen: soft, non-tender; bowel sounds normal; no masses,  no organomegaly Pelvic: cervix normal in appearance, external genitalia normal, no adnexal masses or tenderness, no cervical motion tenderness, uterus normal size, shape, and consistency and vagina normal without discharge Extremities: extremities normal, atraumatic, no cyanosis or edema Pulses: 2+ and symmetric Skin: Skin color, texture, turgor normal. No rashes or lesions Lymph nodes: Cervical, supraclavicular, and axillary nodes normal. Neurologic: Grossly normal    Assessment:    Healthy female exam.      Plan:   Problem List Items Addressed This Visit      Unprioritized   History of hypothyroidism - Primary   Relevant Orders   TSH    Other Visit Diagnoses    Irregular periods/menstrual cycles       Relevant Orders   Follicle stimulating  hormone   Screening for malignant neoplasm of cervix       Relevant Orders   Cytology - PAP( [facility])   Encounter for gynecological examination without abnormal finding       Relevant Orders   MM 3D SCREEN BREAST BILATERAL   CBC   Comprehensive metabolic panel   VITAMIN D 25 Hydroxy (Vit-D Deficiency, Fractures)   Lipid panel   Hemoglobin A1c     Return in 1 year (on 08/04/2020).    See After Visit Summary for Counseling Recommendations

## 2019-08-05 NOTE — Progress Notes (Signed)
Last pap 01/24/2015-normal

## 2019-08-05 NOTE — Patient Instructions (Signed)

## 2019-08-06 LAB — HEMOGLOBIN A1C
Est. average glucose Bld gHb Est-mCnc: 100 mg/dL
Hgb A1c MFr Bld: 5.1 % (ref 4.8–5.6)

## 2019-08-06 LAB — COMPREHENSIVE METABOLIC PANEL
ALT: 15 IU/L (ref 0–32)
AST: 20 IU/L (ref 0–40)
Albumin/Globulin Ratio: 1.5 (ref 1.2–2.2)
Albumin: 4.3 g/dL (ref 3.8–4.8)
Alkaline Phosphatase: 66 IU/L (ref 48–121)
BUN/Creatinine Ratio: 24 — ABNORMAL HIGH (ref 9–23)
BUN: 20 mg/dL (ref 6–24)
Bilirubin Total: 0.3 mg/dL (ref 0.0–1.2)
CO2: 23 mmol/L (ref 20–29)
Calcium: 9.8 mg/dL (ref 8.7–10.2)
Chloride: 102 mmol/L (ref 96–106)
Creatinine, Ser: 0.82 mg/dL (ref 0.57–1.00)
GFR calc Af Amer: 98 mL/min/{1.73_m2} (ref >59)
GFR calc non Af Amer: 85 mL/min/{1.73_m2} (ref >59)
Globulin, Total: 2.8 g/dL (ref 1.5–4.5)
Glucose: 89 mg/dL (ref 65–99)
Potassium: 4.6 mmol/L (ref 3.5–5.2)
Sodium: 138 mmol/L (ref 134–144)
Total Protein: 7.1 g/dL (ref 6.0–8.5)

## 2019-08-06 LAB — TSH: TSH: 2.7 u[IU]/mL (ref 0.450–4.500)

## 2019-08-06 LAB — CBC
Hematocrit: 38.7 % (ref 34.0–46.6)
Hemoglobin: 13.5 g/dL (ref 11.1–15.9)
MCH: 31.8 pg (ref 26.6–33.0)
MCHC: 34.9 g/dL (ref 31.5–35.7)
MCV: 91 fL (ref 79–97)
Platelets: 314 10*3/uL (ref 150–450)
RBC: 4.24 x10E6/uL (ref 3.77–5.28)
RDW: 12.3 % (ref 11.7–15.4)
WBC: 9.9 10*3/uL (ref 3.4–10.8)

## 2019-08-06 LAB — LIPID PANEL
Chol/HDL Ratio: 3.1 ratio (ref 0.0–4.4)
Cholesterol, Total: 195 mg/dL (ref 100–199)
HDL: 62 mg/dL (ref >39)
LDL Chol Calc (NIH): 113 mg/dL — ABNORMAL HIGH (ref 0–99)
Triglycerides: 114 mg/dL (ref 0–149)
VLDL Cholesterol Cal: 20 mg/dL (ref 5–40)

## 2019-08-06 LAB — VITAMIN D 25 HYDROXY (VIT D DEFICIENCY, FRACTURES): Vit D, 25-Hydroxy: 71.6 ng/mL (ref 30.0–100.0)

## 2019-08-06 LAB — FOLLICLE STIMULATING HORMONE: FSH: 2.4 m[IU]/mL

## 2019-08-09 LAB — CYTOLOGY - PAP
Adequacy: ABSENT
Comment: NEGATIVE
Diagnosis: NEGATIVE
High risk HPV: NEGATIVE

## 2019-09-17 ENCOUNTER — Ambulatory Visit
Admission: RE | Admit: 2019-09-17 | Discharge: 2019-09-17 | Disposition: A | Discharge status: Home / Self Care | Payer: 59 | Source: Ambulatory Visit | Attending: Family Medicine | Admitting: Family Medicine

## 2019-09-17 DIAGNOSIS — Z01419 Encounter for gynecological examination (general) (routine) without abnormal findings: Secondary | ICD-10-CM | POA: Diagnosis not present

## 2019-09-17 DIAGNOSIS — Z1231 Encounter for screening mammogram for malignant neoplasm of breast: Secondary | ICD-10-CM | POA: Diagnosis not present

## 2019-09-21 ENCOUNTER — Other Ambulatory Visit: Payer: Self-pay | Admitting: Family Medicine

## 2019-09-21 DIAGNOSIS — N6310 Unspecified lump in the right breast, unspecified quadrant: Secondary | ICD-10-CM

## 2019-09-21 DIAGNOSIS — R928 Other abnormal and inconclusive findings on diagnostic imaging of breast: Secondary | ICD-10-CM

## 2019-09-28 ENCOUNTER — Ambulatory Visit
Admission: RE | Admit: 2019-09-28 | Discharge: 2019-09-28 | Disposition: A | Discharge status: Home / Self Care | Payer: 59 | Source: Ambulatory Visit | Attending: Family Medicine | Admitting: Family Medicine

## 2019-09-28 DIAGNOSIS — N6310 Unspecified lump in the right breast, unspecified quadrant: Secondary | ICD-10-CM

## 2019-09-28 DIAGNOSIS — R928 Other abnormal and inconclusive findings on diagnostic imaging of breast: Secondary | ICD-10-CM | POA: Diagnosis present

## 2019-10-26 ENCOUNTER — Ambulatory Visit: Payer: 59 | Admitting: Family Medicine

## 2019-10-26 ENCOUNTER — Encounter: Payer: Self-pay | Admitting: Family Medicine

## 2019-10-26 ENCOUNTER — Other Ambulatory Visit: Payer: Self-pay

## 2019-10-26 VITALS — BP 122/70 | HR 60 | Temp 98.3°F | Ht 63.0 in | Wt 131.0 lb

## 2019-10-26 DIAGNOSIS — M25512 Pain in left shoulder: Secondary | ICD-10-CM

## 2019-10-26 DIAGNOSIS — G8929 Other chronic pain: Secondary | ICD-10-CM | POA: Diagnosis not present

## 2019-10-26 MED ORDER — DICLOFENAC SODIUM 75 MG PO TBEC: 75.0000 mg | DELAYED_RELEASE_TABLET | Freq: Two times a day (BID) | ORAL | 0 refills | Status: DC

## 2019-10-26 NOTE — Progress Notes (Signed)
Chief Complaint  Patient presents with  . Left Shoulder Pain    Sees chiropractor. Was advised its from carrying big shoulder bags.    History of Present Illness: HPI    49 year old female presents with new onset pain in left shoulder.   She has had left shoulder pain ongoing for year.. worse in last few months.  She has been seeing a chiropractor  Feels some grinding in left shoulder.  No fall, no activity changes. Pain occ radiates from neck to left arm.   no numbness, no weakness.   no redness.   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  She has been using massage and Biofreeze for left arm pain.       This visit occurred during the SARS-CoV-2 public health emergency.  Safety protocols were in place, including screening questions prior to the visit, additional usage of staff PPE, and extensive cleaning of exam room while observing appropriate contact time as indicated for disinfecting solutions.   COVID 19 screen:  No recent travel or known exposure to COVID19 The patient denies respiratory symptoms of COVID 19 at this time. The importance of social distancing was discussed today.     Review of Systems  Constitutional: Negative for chills and fever.  HENT: Negative for congestion and ear pain.   Eyes: Negative for pain and redness.  Respiratory: Negative for cough and shortness of breath.   Cardiovascular: Negative for chest pain, palpitations and leg swelling.  Gastrointestinal: Negative for abdominal pain, blood in stool, constipation, diarrhea, nausea and vomiting.  Genitourinary: Negative for dysuria.  Musculoskeletal: Negative for falls and myalgias.  Skin: Negative for rash.  Neurological: Negative for dizziness.  Psychiatric/Behavioral: Negative for depression. The patient is not nervous/anxious.       Past Medical History:  Diagnosis Date  . Abdominal pain, right upper quadrant   . Allergic rhinitis, cause unspecified   . Depressive disorder, not  elsewhere classified   . Dizziness and giddiness   . Esophageal reflux   . Nontoxic multinodular goiter   . Other chronic sinusitis   . Other malaise and fatigue   . Pain in limb   . Peptic ulcer, unspecified site, unspecified as acute or chronic, without mention of hemorrhage, perforation, or obstruction   . Premenstrual tension syndromes   . Pure hypercholesterolemia   . Unspecified hypothyroidism     reports that she has never smoked. She has never used smokeless tobacco. She reports that she does not drink alcohol and does not use drugs.   Current Outpatient Medications:  .  albuterol (PROAIR HFA) 108 (90 Base) MCG/ACT inhaler, ProAir HFA 90 mcg/actuation aerosol inhaler  INHALE 2 PUFFS BY MOUTH EVERY 4-6 HOURS AS NEEDED FOR COUGH/WHEEZE, Disp: , Rfl:  .  ALPRAZolam (XANAX) 0.5 MG tablet, Can use  1 tab daily  as needed for anxiety on long trips., Disp: 20 tablet, Rfl: 0 .  ARNUITY ELLIPTA 100 MCG/ACT AEPB, INHALE 1 PUFF BY MOUTH DAILY, Disp: , Rfl: 5 .  azelastine (OPTIVAR) 0.05 % ophthalmic solution, Place 1 drop into both eyes daily as needed. , Disp: , Rfl:  .  cetirizine (ZYRTEC) 10 MG tablet, Take 10 mg by mouth daily., Disp: , Rfl:  .  cyclobenzaprine (FLEXERIL) 10 MG tablet, Take 1 tablet (10 mg total) by mouth at bedtime as needed for muscle spasms., Disp: 20 tablet, Rfl: 0 .  fexofenadine (ALLEGRA) 180 MG tablet, Take 180 mg by mouth daily as needed. ,  Disp: , Rfl: 5 .  fluticasone (FLONASE) 50 MCG/ACT nasal spray, Place into both nostrils daily., Disp: , Rfl:  .  montelukast (SINGULAIR) 10 MG tablet, Take 10 mg by mouth at bedtime.  , Disp: , Rfl:  .  Multiple Vitamin (MULTIVITAMIN) tablet, Take 1 tablet by mouth daily., Disp: , Rfl:  .  Probiotic Product (PROBIOTIC FORMULA PO), Take 1 tablet by mouth daily., Disp: , Rfl:  .  tranexamic acid (LYSTEDA) 650 MG TABS tablet, TAKE 2 TABLETS BY MOUTH 3 TIMES DAILY. TAKE DURING MENSES FOR MAXIMUM OF 5 DAYS, Disp: 30 tablet, Rfl:  2 .  rizatriptan (MAXALT) 10 MG tablet, TAKE 1 TABLET (10 MG TOTAL) BY MOUTH AS NEEDED FOR MIGRAINE. MAY REPEAT IN 2 HOURS IF NEEDED (Patient not taking: Reported on 10/26/2019), Disp: 10 tablet, Rfl: 0 .  topiramate (TOPAMAX) 25 MG tablet, Take 1 tablet (25 mg total) by mouth at bedtime as needed. (Patient not taking: Reported on 10/26/2019), Disp: 30 tablet, Rfl: 3   Observations/Objective: Blood pressure 122/70, pulse 60, temperature 98.3 F (36.8 C), height 5\' 3"  (1.6 m), weight 131 lb (59.4 kg).  Physical Exam Constitutional:      General: She is not in acute distress.Vital signs are normal.     Appearance: Normal appearance. She is well-developed and well-nourished. She is not ill-appearing or toxic-appearing.  HENT:     Head: Normocephalic.     Right Ear: Hearing, tympanic membrane, ear canal and external ear normal. Tympanic membrane is not erythematous, retracted or bulging.     Left Ear: Hearing, tympanic membrane, ear canal and external ear normal. Tympanic membrane is not erythematous, retracted or bulging.     Nose: No mucosal edema or rhinorrhea.     Right Sinus: No maxillary sinus tenderness or frontal sinus tenderness.     Left Sinus: No maxillary sinus tenderness or frontal sinus tenderness.     Mouth/Throat:     Mouth: Oropharynx is clear and moist and mucous membranes are normal.     Pharynx: Uvula midline.  Eyes:     General: Lids are normal. Lids are everted, no foreign bodies appreciated.     Extraocular Movements: EOM normal.     Conjunctiva/sclera: Conjunctivae normal.     Pupils: Pupils are equal, round, and reactive to light.  Neck:     Thyroid: No thyroid mass or thyromegaly.     Vascular: No carotid bruit.     Trachea: Trachea normal.  Cardiovascular:     Rate and Rhythm: Normal rate and regular rhythm.     Pulses: Normal pulses and intact distal pulses.     Heart sounds: Normal heart sounds, S1 normal and S2 normal. No murmur heard. No friction rub. No  gallop.   Pulmonary:     Effort: Pulmonary effort is normal. No tachypnea or respiratory distress.     Breath sounds: Normal breath sounds. No decreased breath sounds, wheezing, rhonchi or rales.  Abdominal:     General: Bowel sounds are normal.     Palpations: Abdomen is soft.     Tenderness: There is no abdominal tenderness.  Musculoskeletal:     Right shoulder: Normal.     Left shoulder: Tenderness present. No effusion, bony tenderness or crepitus. Decreased range of motion. Normal strength. Normal pulse.     Cervical back: Normal range of motion and neck supple.  Skin:    General: Skin is warm, dry and intact.     Findings: No rash.  Neurological:  Mental Status: She is alert.  Psychiatric:        Mood and Affect: Mood is not anxious or depressed.        Speech: Speech normal.        Behavior: Behavior normal. Behavior is cooperative.        Thought Content: Thought content normal.        Cognition and Memory: Cognition and memory normal.        Judgment: Judgment normal.      Assessment and Plan    Problem List Items Addressed This Visit    Chronic pain in left shoulder - Primary    Acute flare of chronic issue Start diclofenac twice daily x 2 weeks.  Start home PT.  Call or follow up if not improving.. consider X-ray or referral.       Relevant Medications   diclofenac (VOLTAREN) 75 MG EC tablet        Eliezer Lofts, MD

## 2019-10-26 NOTE — Patient Instructions (Signed)
Start diclofenac twice daily x 2 weeks.  Start home PT.  Call or follow up if not improving.. consider X-ray or referral.

## 2019-11-17 ENCOUNTER — Other Ambulatory Visit: Payer: Self-pay

## 2019-11-17 ENCOUNTER — Ambulatory Visit: Payer: 59 | Admitting: Dermatology

## 2019-11-17 DIAGNOSIS — B351 Tinea unguium: Secondary | ICD-10-CM | POA: Diagnosis not present

## 2019-11-17 DIAGNOSIS — L820 Inflamed seborrheic keratosis: Secondary | ICD-10-CM

## 2019-11-17 DIAGNOSIS — L03039 Cellulitis of unspecified toe: Secondary | ICD-10-CM

## 2019-11-17 DIAGNOSIS — L03031 Cellulitis of right toe: Secondary | ICD-10-CM | POA: Diagnosis not present

## 2019-11-17 MED ORDER — FLUCONAZOLE 150 MG PO TABS: 150.0000 mg | ORAL_TABLET | Freq: Every day | ORAL | 1 refills | Status: DC

## 2019-11-17 MED ORDER — JUBLIA 10 % EX SOLN: CUTANEOUS | 5 refills | Status: DC

## 2019-11-17 MED ORDER — CEFDINIR 300 MG PO CAPS: 300.0000 mg | ORAL_CAPSULE | Freq: Two times a day (BID) | ORAL | 0 refills | Status: AC

## 2019-11-17 NOTE — Progress Notes (Signed)
   Follow-Up Visit   Subjective  Isabel Foley is a 49 y.o. female who presents for the following: lesion (Right upper arm. C/O mole that went through changes. Was itching, patient scratched off. Dur: >1 month.) and Skin Problem (C/O infection at right great toe. Off and on for 1 year. Using peroxide cleanser. Neosporin with pain reliever.).    The following portions of the chart were reviewed this encounter and updated as appropriate:     Review of Systems: No other skin or systemic complaints except as noted in HPI or Assessment and Plan.  Objective  Well appearing patient in no apparent distress; mood and affect are within normal limits.  A focused examination was performed including face, arms. Relevant physical exam findings are noted in the Assessment and Plan.  Objective  Right Upper Arm: 58mm light brown macule with light crusting  Objective  Right Hallux Toe Nail Plate: Erythema and mild edema, absent cuticle at periungual right great toe,  light yellow brown discoloration at lateral nail plate  Assessment & Plan  Inflamed seborrheic keratosis Right Upper Arm  With partial resolution.  Benign-appearing.  Observation.  Call clinic for new or changing moles.  Recommend daily use of broad spectrum spf 30+ sunscreen to sun-exposed areas.    Patient defers additional tx today. RTC PRN recurrence.   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 Recent flare  Start Omnicef 300 mg PO bid x 10 days Start Jublia solution qhs to nail, sample given Rec. Epson salt warm water soaks qd  cefdinir (OMNICEF) 300 MG capsule - Right Hallux Toe Nail Plate  Efinaconazole (JUBLIA) 10 % SOLN - Right Hallux Toe Nail Plate  fluconazole (DIFLUCAN) 150 MG tablet - Right Hallux Toe Nail Plate  Return if symptoms worsen or fail to improve.   I, Isabel Foley, CMA, am acting as scribe for Isabel Patty, MD.  Documentation: I have reviewed the  above documentation for accuracy and completeness, and I agree with the above.  Isabel Patty MD

## 2019-11-17 NOTE — Patient Instructions (Signed)

## 2019-11-26 ENCOUNTER — Other Ambulatory Visit: Payer: Self-pay | Admitting: Family Medicine

## 2019-11-26 NOTE — Telephone Encounter (Signed)
Last office visit 10/26/2019 for shoulder pain.  Last refilled 03/29/2019 for #20 with no refills. No future appointments.

## 2019-12-06 NOTE — Telephone Encounter (Signed)
This note came to me but PCP is already addressing and I will defer.  I thank all involved.

## 2019-12-16 ENCOUNTER — Other Ambulatory Visit: Payer: Self-pay | Admitting: Family Medicine

## 2019-12-16 DIAGNOSIS — N920 Excessive and frequent menstruation with regular cycle: Secondary | ICD-10-CM

## 2019-12-16 NOTE — Telephone Encounter (Signed)
Last office visit 10/26/2019 for left shoulder pain. Last refilled 06/25/2018 for #30 with 2 refills. No future appointments.

## 2020-01-03 NOTE — Assessment & Plan Note (Signed)
Acute flare of chronic issue Start diclofenac twice daily x 2 weeks.  Start home PT.  Call or follow up if not improving.. consider X-ray or referral.

## 2020-03-21 ENCOUNTER — Encounter: Payer: Self-pay | Admitting: Family Medicine

## 2020-03-21 ENCOUNTER — Other Ambulatory Visit: Payer: Self-pay

## 2020-03-21 ENCOUNTER — Telehealth (INDEPENDENT_AMBULATORY_CARE_PROVIDER_SITE_OTHER): Payer: 59 | Admitting: Family Medicine

## 2020-03-21 VITALS — HR 83 | Ht 63.0 in | Wt 129.0 lb

## 2020-03-21 DIAGNOSIS — J302 Other seasonal allergic rhinitis: Secondary | ICD-10-CM

## 2020-03-21 DIAGNOSIS — J0100 Acute maxillary sinusitis, unspecified: Secondary | ICD-10-CM

## 2020-03-21 MED ORDER — AZITHROMYCIN 250 MG PO TABS: ORAL_TABLET | ORAL | 0 refills | Status: DC

## 2020-03-21 MED ORDER — PREDNISONE 20 MG PO TABS: ORAL_TABLET | ORAL | 0 refills | Status: DC

## 2020-03-21 MED ORDER — BENZONATATE 200 MG PO CAPS: 200.0000 mg | ORAL_CAPSULE | Freq: Three times a day (TID) | ORAL | 0 refills | Status: DC | PRN

## 2020-03-21 MED ORDER — ALPRAZOLAM 0.5 MG PO TABS
ORAL_TABLET | ORAL | 0 refills | Status: DC
Start: 2020-03-21 — End: 2022-05-22

## 2020-03-21 NOTE — Assessment & Plan Note (Signed)
Continue current meds, but limit Afrin.  Follow up as planned with allergist Dr. Remus Blake.

## 2020-03-21 NOTE — Assessment & Plan Note (Signed)
Likely allergic vs viral. Treat with prednisone taper..if not improving consider treatment with antibiotic given symptoms ongoing x 2 weeks.

## 2020-03-21 NOTE — Progress Notes (Signed)
VIRTUAL VISIT Due to national recommendations of social distancing due to [place] 19, a virtual visit is felt to be most appropriate for this patient at this time.   I connected with the patient on 03/21/20 at 12:00 PM EDT by virtual telehealth platform and verified that I am speaking with the correct person using two identifiers.   I discussed the limitations, risks, security and privacy concerns of performing an evaluation and management service by  virtual telehealth platform and the availability of in person appointments. I also discussed with the patient that there may be a patient responsible charge related to this service. The patient expressed understanding and agreed to proceed.  Patient location: Home Provider Location: [facility] [place] Participants: Eliezer Lofts and [name]   Chief Complaint  Patient presents with  . Facial Swelling  . Sinue Pressure  . Ears Itching  . Nasal Congestion  . Dizziness  . Cough    History of Present Illness: Cough This is a new problem. The current episode started 1 to 4 weeks ago (2 weeks). The problem occurs constantly (Started as allergies, symptoms improve with afrin btu come back whn she stops it.). The cough is productive of sputum (secondary to PND when lying down). Associated symptoms include ear congestion, ear pain, headaches, nasal congestion and postnasal drip. Pertinent negatives include no chills, fever, rash, shortness of breath or wheezing. Associated symptoms comments: Facial pain.Marland Kitchen greatest on right  both ears itchy, more on left   decreased hearing,  occ sharp pain  occ intermittent dizziness.. improves after several days of afrin.. The symptoms are aggravated by lying down. Risk factors: nonsmoker. Treatments tried:  delsym, afrin, occ using ventolin,  alternating allegra and zyrte, singulair.     Dr. Remus Blake.Marland Kitchen allergies following.    COVID 19 screen Had COVID in [place]  exposure: No recent travel or known exposure to [place]  The importance of social distancing was discussed today.   Review of Systems  Constitutional: Negative for chills and fever.  HENT: Positive for ear pain and postnasal drip.   Respiratory: Positive for cough. Negative for shortness of breath and wheezing.   Skin: Negative for rash.  Neurological: Positive for headaches.      Past Medical History:  Diagnosis Date  . Abdominal pain, right upper quadrant   . Allergic rhinitis, cause unspecified   . Depressive disorder, not elsewhere classified   . Dizziness and giddiness   . Esophageal reflux   . Nontoxic multinodular goiter   . Other chronic sinusitis   . Other malaise and fatigue   . Pain in limb   . Peptic ulcer, unspecified site, unspecified as acute or chronic, without mention of hemorrhage, perforation, or obstruction   . Premenstrual tension syndromes   . Pure hypercholesterolemia   . Unspecified hypothyroidism     reports that she has never smoked. She has never used smokeless tobacco. She reports that she does not drink alcohol and does not use drugs.   Current Outpatient Medications:  .  albuterol (VENTOLIN HFA) 108 (90 Base) MCG/ACT inhaler, ProAir HFA 90 mcg/actuation aerosol inhaler  INHALE 2 PUFFS BY MOUTH EVERY 4-6 HOURS AS NEEDED FOR COUGH/WHEEZE, Disp: , Rfl:  .  ALPRAZolam (XANAX) 0.5 MG tablet, Can use  1 tab daily  as needed for anxiety on long trips., Disp: 20 tablet, Rfl: 0 .  ARNUITY ELLIPTA 100 MCG/ACT AEPB, INHALE 1 PUFF BY MOUTH DAILY, Disp: , Rfl: 5 .  azelastine (  OPTIVAR) 0.05 % ophthalmic solution, Place 1 drop into both eyes daily as needed. , Disp: , Rfl:  .  cetirizine (ZYRTEC) 10 MG tablet, Take 10 mg by mouth daily., Disp: , Rfl:  .  cyclobenzaprine (FLEXERIL) 10 MG tablet, TAKE 1 TABLET BY MOUTH AT BEDTIME AS NEEDED FOR MUSCLE SPASMS, Disp: 20 tablet, Rfl: 0 .  diclofenac (VOLTAREN) 75 MG EC tablet, Take 1 tablet (75 mg total) by mouth 2 (two)  times daily., Disp: 30 tablet, Rfl: 0 .  Efinaconazole (JUBLIA) 10 % SOLN, Apply QHS to affected toenails, Disp: 4 mL, Rfl: 5 .  fexofenadine (ALLEGRA) 180 MG tablet, Take 180 mg by mouth daily as needed. , Disp: , Rfl: 5 .  fluconazole (DIFLUCAN) 150 MG tablet, Take 1 tablet (150 mg total) by mouth daily. Repeat in 7 days if needed, Disp: 2 tablet, Rfl: 1 .  fluticasone (FLONASE) 50 MCG/ACT nasal spray, Place into both nostrils daily., Disp: , Rfl:  .  montelukast (SINGULAIR) 10 MG tablet, Take 10 mg by mouth at bedtime., Disp: , Rfl:  .  Multiple Vitamin (MULTIVITAMIN) tablet, Take 1 tablet by mouth daily., Disp: , Rfl:  .  Probiotic Product (PROBIOTIC FORMULA PO), Take 1 tablet by mouth daily., Disp: , Rfl:  .  rizatriptan (MAXALT) 10 MG tablet, TAKE 1 TABLET (10 MG TOTAL) BY MOUTH AS NEEDED FOR MIGRAINE. MAY REPEAT IN 2 HOURS IF NEEDED, Disp: 10 tablet, Rfl: 0 .  topiramate (TOPAMAX) 25 MG tablet, Take 1 tablet (25 mg total) by mouth at bedtime as needed., Disp: 30 tablet, Rfl: 3 .  tranexamic acid (LYSTEDA) 650 MG TABS tablet, TAKE 2 TABLETS BY MOUTH 3 TIMES DAILY. TAKE DURING MENSES FOR MAXIMUM OF 5 DAYS, Disp: 30 tablet, Rfl: 2   Observations/Objective: Pulse 83, height 5\' 3"  (1.6 m), weight 129 lb (58.5 kg), last menstrual period 03/07/2020, SpO2 98 %.  Physical Exam  Physical Exam Constitutional:      General: The patient is not in acute distress. Pulmonary:     Effort: Pulmonary effort is normal. No respiratory distress.  Neurological:     Mental Status: The patient is alert and oriented to person, place, and time.  Psychiatric:        Mood and Affect: Mood normal.        Behavior: Behavior normal.   Assessment and Plan Problem List Items Addressed This Visit    Acute non-recurrent maxillary sinusitis - Primary    Likely allergic vs viral. Treat with prednisone taper..if not improving consider treatment with antibiotic given symptoms ongoing x 2 weeks.       Relevant  Medications   predniSONE (DELTASONE) 20 MG tablet   azithromycin (ZITHROMAX) 250 MG tablet   benzonatate (TESSALON) 200 MG capsule   Allergic rhinitis    Continue current meds, but limit Afrin.  Follow up as planned with allergist Dr. Remus Blake.           I discussed the assessment and treatment plan with the patient. The patient was provided an opportunity to ask questions and all were answered. The patient agreed with the plan and demonstrated an understanding of the instructions.   The patient was advised to call back or seek an in-person evaluation if the symptoms worsen or if the condition fails to improve as anticipated.     Eliezer Lofts, MD

## 2020-03-22 ENCOUNTER — Telehealth: Payer: 59 | Admitting: Family Medicine

## 2020-09-01 NOTE — Telephone Encounter (Signed)
FYI

## 2021-01-02 ENCOUNTER — Ambulatory Visit (INDEPENDENT_AMBULATORY_CARE_PROVIDER_SITE_OTHER): Payer: 59 | Admitting: Dermatology

## 2021-01-02 ENCOUNTER — Other Ambulatory Visit: Payer: Self-pay

## 2021-01-02 ENCOUNTER — Telehealth: Payer: Self-pay | Admitting: Family Medicine

## 2021-01-02 ENCOUNTER — Ambulatory Visit: Payer: 59 | Admitting: Dermatology

## 2021-01-02 DIAGNOSIS — L309 Dermatitis, unspecified: Secondary | ICD-10-CM

## 2021-01-02 DIAGNOSIS — N920 Excessive and frequent menstruation with regular cycle: Secondary | ICD-10-CM

## 2021-01-02 MED ORDER — MOMETASONE FUROATE 0.1 % EX CREA: 1.0000 "application " | TOPICAL_CREAM | Freq: Two times a day (BID) | CUTANEOUS | 1 refills | Status: DC

## 2021-01-02 NOTE — Progress Notes (Signed)
° °  New Patient Visit  Subjective  Isabel Foley is a 50 y.o. female who presents for the following: Rash (Arms - comes and goes but can not see today. She has been treating with manuka honey).  She showed it to Dr. Nicole Kindred when she was here last with her son, and was told it looked like eczema.  She also has h/o asthma and allergies.  Accompanied by son  The following portions of the chart were reviewed this encounter and updated as appropriate:       Review of Systems:  No other skin or systemic complaints except as noted in HPI or Assessment and Plan.  Objective  Well appearing patient in no apparent distress; mood and affect are within normal limits.  A focused examination was performed including arms, neck. Relevant physical exam findings are noted in the Assessment and Plan.  Arms Follicular prominence with wild xerosis of arms and right post neck. Mild erythema of hand dorsum MCP and fingertips with vertical ridging thumbnails    Assessment & Plan  Eczema, unspecified type Arms  Improved today  Continue Dove soap daily, and moisturizer daily  Start Mometasone cream bid prn flares  Advised patient to avoid hot shower/bath and moisturize skin as soon as she is out of bath or shower. May continue Benadryl qhs prn itch  Atopic dermatitis (eczema) is a chronic, relapsing, pruritic condition that can significantly affect quality of life. It is often associated with allergic rhinitis and/or asthma and can require treatment with topical medications, phototherapy, or in severe cases biologic injectable medication (Dupixent; Adbry) or Oral JAK inhibitors.    mometasone (ELOCON) 0.1 % cream - Arms Apply 1 application topically 2 (two) times daily. Prn flares   Return if symptoms worsen or fail to improve.   I, Ashok Cordia, CMA, am acting as scribe for [name], MD .  Documentation: I have reviewed the above documentation for accuracy and completeness, and I agree with the  above.  [name] MD

## 2021-01-02 NOTE — Patient Instructions (Addendum)
Recommend OTC [place] Rapid Relief Anti-Itch cream (pramoxine + menthol), CeraVe Anti-itch cream or lotion (pramoxine), Sarna lotion (Original- menthol + camphor or Sensitive- pramoxine) or Eucerin 12 hour Itch Relief lotion (menthol) up to 3 times per day to areas on body that are itchy.  Gentle Skin Care Guide  1. Bathe no more than once a day.  2. Avoid bathing in hot water  3. Use a mild soap like Dove, Vanicream, Cetaphil, CeraVe. Can use Lever 2000 or Cetaphil antibacterial soap  4. Use soap only where you need it. On most days, use it under your arms, between your legs, and on your feet. Let the water rinse other areas unless visibly dirty.  5. When you get out of the bath/shower, use a towel to gently blot your skin dry, don't rub it.  6. While your skin is still a little damp, apply a moisturizing cream such as Vanicream, CeraVe, Cetaphil, Eucerin, Sarna lotion or plain Vaseline Jelly. For hands apply Neutrogena [place] Hand Cream or Excipial Hand Cream.  7. Reapply moisturizer any time you start to itch or feel dry.  8. Sometimes using free and clear laundry detergents can be helpful. Fabric softener sheets should be avoided. Downy Free & Gentle liquid, or any liquid fabric softener that is free of dyes and perfumes, it acceptable to use  9. If your doctor has given you prescription creams you may apply moisturizers over them    If You Need Anything After Your Visit  If you have any questions or concerns for your doctor, please call our main line at [phone] and press option 4 to reach your doctor's medical assistant. If no one answers, please leave a voicemail as directed and we will return your call as soon as possible. Messages left after 4 pm will be answered the following business day.   You may also send [place] a message via [place]. We typically respond to MyChart messages within 1-2 business days.  For prescription refills, please ask your pharmacy to contact our  office. Our fax number is [phone].  If you have an urgent issue when the clinic is closed that cannot wait until the next business day, you can page your doctor at the number below.    Please note that while we do our best to be available for urgent issues outside of office hours, we are not available 24/7.   If you have an urgent issue and are unable to reach [place], you may choose to seek medical care at your doctor's office, retail clinic, urgent care center, or emergency room.  If you have a medical emergency, please immediately call 911 or go to the emergency department.  Pager Numbers  - Dr. Nehemiah Massed: [phone]  - Dr. Laurence Ferrari: [phone]  - Dr. Nicole Kindred: [phone]  In the event of inclement weather, please call our main line at [phone] for an update on the status of any delays or closures.  Dermatology Medication Tips: Please keep the boxes that topical medications come in in order to help keep track of the instructions about where and how to use these. Pharmacies typically print the medication instructions only on the boxes and not directly on the medication tubes.   If your medication is too expensive, please contact our office at [phone] option 4 or send [place] a message through [place].   We are unable to tell what your co-pay for medications will be in advance as this is different depending on your insurance coverage. However, we may  be able to find a substitute medication at lower cost or fill out paperwork to get insurance to cover a needed medication.   If a prior authorization is required to get your medication covered by your insurance company, please allow [place] 1-2 business days to complete this process.  Drug prices often vary depending on where the prescription is filled and some pharmacies may offer cheaper prices.  The website www.goodrx.com contains coupons for medications through different pharmacies. The prices here do not account for what the cost may  be with help from insurance (it may be cheaper with your insurance), but the website can give you the price if you did not use any insurance.  - You can print the associated coupon and take it with your prescription to the pharmacy.  - You may also stop by our office during regular business hours and pick up a GoodRx coupon card.  - If you need your prescription sent electronically to a different pharmacy, notify our office through [facility] or by phone at [phone] option 4.     Si Usted Necesita Algo Despus de Su Visita  Tambin puede enviarnos un mensaje a travs de Pharmacist, community. Por lo general respondemos a los mensajes de MyChart en el transcurso de 1 a 2 das hbiles.  Para renovar recetas, por favor pida a su farmacia que se ponga en contacto con nuestra oficina. Harland Dingwall de fax es [place] [phone].  Si tiene un asunto urgente cuando la clnica est cerrada y que no puede esperar hasta el siguiente da hbil, puede llamar/localizar a su doctor(a) al nmero que aparece a continuacin.   Por favor, tenga en cuenta que aunque hacemos todo lo posible para estar disponibles para asuntos urgentes fuera del horario de [place], no estamos disponibles las 24 horas del da, los 7 das de la [place].   Si tiene un problema urgente y no puede comunicarse con nosotros, puede optar por buscar atencin mdica  en el consultorio de su doctor(a), en una clnica privada, en un centro de atencin urgente o en una sala de emergencias.  Si tiene Engineering geologist, por favor llame inmediatamente al 911 o vaya a la sala de emergencias.  Nmeros de bper  - Dr. Nehemiah Massed: [phone]  - Dra. Moye: [phone]  - Dra. Nicole Kindred: [phone]  En caso de inclemencias del [place], por favor llame a Johnsie Kindred principal al [phone] para una actualizacin sobre el [place] de cualquier retraso o cierre.  Consejos para la medicacin en dermatologa: Por favor, guarde las cajas en las  que vienen los medicamentos de uso tpico para ayudarle a seguir las instrucciones sobre dnde y cmo usarlos. Las farmacias generalmente imprimen las instrucciones del medicamento slo en las cajas y no directamente en los tubos del [place].   Si su medicamento es muy caro, por favor, pngase en contacto con Zigmund Daniel llamando al [phone] y presione la opcin 4 o envenos un mensaje a travs de Pharmacist, community.   No podemos decirle cul ser su copago por los medicamentos por adelantado ya que esto es diferente dependiendo de la cobertura de su seguro. Sin embargo, es posible que podamos encontrar un medicamento sustituto a Electrical engineer un formulario para que el seguro cubra el medicamento que se considera necesario.   Si se requiere una autorizacin previa para que su compaa de seguros [place] su medicamento, por favor permtanos de 1 a 2 das hbiles para completar este proceso.  Los precios de los medicamentos varan  con frecuencia dependiendo del lugar de dnde se surte la receta y alguna farmacias pueden ofrecer precios ms baratos.  El sitio web www.goodrx.com tiene cupones para medicamentos de Airline pilot. Los precios aqu no tienen en cuenta lo que podra costar con la ayuda del seguro (puede ser ms barato con su seguro), pero el sitio web puede darle el precio si no utiliz Research scientist (physical sciences).  - Puede imprimir el cupn correspondiente y llevarlo con su receta a la farmacia.  - Tambin puede pasar por nuestra oficina durante el horario de atencin regular y Charity fundraiser una tarjeta de cupones de GoodRx.  - Si necesita que su receta se enve electrnicamente a una farmacia diferente, informe a nuestra oficina a travs de MyChart de [place] o por telfono llamando al [phone] y presione la opcin 4.

## 2021-01-02 NOTE — Addendum Note (Signed)
Addended by: [name] on: 01/02/2021 07:13 PM   Modules accepted: Level of Service

## 2021-01-02 NOTE — Telephone Encounter (Signed)
Last office visit 03/21/2020 for acute non-recurrent maxillary sinusitis and seasonal allergic rhinitis.  Last refilled #30 with 2 refills.  No future appointments with PCP.   Please schedule CPE with fasting labs prior with Dr. Randa Spike.

## 2021-01-02 NOTE — Telephone Encounter (Signed)
Sent mychart letter and lvm for pt to call the office to schedule lab/cpe

## 2021-01-03 NOTE — Telephone Encounter (Signed)
Pt scheduled lab/cpe in march 2023

## 2021-01-06 ENCOUNTER — Other Ambulatory Visit: Payer: Self-pay | Admitting: Family Medicine

## 2021-01-06 DIAGNOSIS — Z1231 Encounter for screening mammogram for malignant neoplasm of breast: Secondary | ICD-10-CM

## 2021-01-15 ENCOUNTER — Ambulatory Visit
Admission: RE | Admit: 2021-01-15 | Discharge: 2021-01-15 | Disposition: A | Discharge status: Home / Self Care | Payer: 59 | Source: Ambulatory Visit | Attending: Family Medicine | Admitting: Family Medicine

## 2021-01-15 ENCOUNTER — Other Ambulatory Visit: Payer: Self-pay

## 2021-01-15 DIAGNOSIS — Z1231 Encounter for screening mammogram for malignant neoplasm of breast: Secondary | ICD-10-CM | POA: Diagnosis not present

## 2021-02-08 ENCOUNTER — Emergency Department: Payer: 59

## 2021-02-08 ENCOUNTER — Inpatient Hospital Stay
Admission: EM | Admit: 2021-02-08 | Discharge: 2021-02-10 | DRG: 301 | Disposition: A | Discharge status: Home / Self Care | Payer: 59 | Attending: Internal Medicine | Admitting: Internal Medicine

## 2021-02-08 ENCOUNTER — Other Ambulatory Visit: Payer: Self-pay

## 2021-02-08 ENCOUNTER — Encounter: Payer: Self-pay | Admitting: Family Medicine

## 2021-02-08 DIAGNOSIS — I773 Arterial fibromuscular dysplasia: Secondary | ICD-10-CM | POA: Diagnosis present

## 2021-02-08 DIAGNOSIS — I7779 Dissection of other artery: Principal | ICD-10-CM | POA: Diagnosis present

## 2021-02-08 DIAGNOSIS — Z79899 Other long term (current) drug therapy: Secondary | ICD-10-CM | POA: Diagnosis not present

## 2021-02-08 DIAGNOSIS — I722 Aneurysm of renal artery: Secondary | ICD-10-CM | POA: Diagnosis present

## 2021-02-08 DIAGNOSIS — E7800 Pure hypercholesterolemia, unspecified: Secondary | ICD-10-CM | POA: Diagnosis present

## 2021-02-08 DIAGNOSIS — R1013 Epigastric pain: Secondary | ICD-10-CM

## 2021-02-08 DIAGNOSIS — Z8249 Family history of ischemic heart disease and other diseases of the circulatory system: Secondary | ICD-10-CM | POA: Diagnosis not present

## 2021-02-08 DIAGNOSIS — K219 Gastro-esophageal reflux disease without esophagitis: Secondary | ICD-10-CM | POA: Diagnosis present

## 2021-02-08 DIAGNOSIS — Z8711 Personal history of peptic ulcer disease: Secondary | ICD-10-CM | POA: Diagnosis not present

## 2021-02-08 DIAGNOSIS — I728 Aneurysm of other specified arteries: Secondary | ICD-10-CM | POA: Insufficient documentation

## 2021-02-08 DIAGNOSIS — G894 Chronic pain syndrome: Secondary | ICD-10-CM | POA: Diagnosis present

## 2021-02-08 DIAGNOSIS — Z20822 Contact with and (suspected) exposure to covid-19: Secondary | ICD-10-CM | POA: Diagnosis present

## 2021-02-08 DIAGNOSIS — Z8639 Personal history of other endocrine, nutritional and metabolic disease: Secondary | ICD-10-CM

## 2021-02-08 DIAGNOSIS — M47812 Spondylosis without myelopathy or radiculopathy, cervical region: Secondary | ICD-10-CM | POA: Diagnosis present

## 2021-02-08 DIAGNOSIS — F325 Major depressive disorder, single episode, in full remission: Secondary | ICD-10-CM | POA: Diagnosis present

## 2021-02-08 LAB — COMPREHENSIVE METABOLIC PANEL
ALT: 29 U/L (ref 0–44)
AST: 28 U/L (ref 15–41)
Albumin: 3.8 g/dL (ref 3.5–5.0)
Alkaline Phosphatase: 55 U/L (ref 38–126)
Anion gap: 4 — ABNORMAL LOW (ref 5–15)
BUN: 14 mg/dL (ref 6–20)
CO2: 27 mmol/L (ref 22–32)
Calcium: 8.8 mg/dL — ABNORMAL LOW (ref 8.9–10.3)
Chloride: 104 mmol/L (ref 98–111)
Creatinine, Ser: 0.66 mg/dL (ref 0.44–1.00)
GFR, Estimated: >60 mL/min (ref >60)
Glucose, Bld: 106 mg/dL — ABNORMAL HIGH (ref 70–99)
Potassium: 3.6 mmol/L (ref 3.5–5.1)
Sodium: 135 mmol/L (ref 135–145)
Total Bilirubin: 0.5 mg/dL (ref 0.3–1.2)
Total Protein: 7.3 g/dL (ref 6.5–8.1)

## 2021-02-08 LAB — CBC
HCT: 38.9 % (ref 36.0–46.0)
Hemoglobin: 12.7 g/dL (ref 12.0–15.0)
MCH: 31.2 pg (ref 26.0–34.0)
MCHC: 32.6 g/dL (ref 30.0–36.0)
MCV: 95.6 fL (ref 80.0–100.0)
Platelets: 295 10*3/uL (ref 150–400)
RBC: 4.07 MIL/uL (ref 3.87–5.11)
RDW: 13.2 % (ref 11.5–15.5)
WBC: 17.2 10*3/uL — ABNORMAL HIGH (ref 4.0–10.5)
nRBC: 0 % (ref 0.0–0.2)

## 2021-02-08 LAB — URINALYSIS, ROUTINE W REFLEX MICROSCOPIC
Bilirubin Urine: NEGATIVE
Glucose, UA: NEGATIVE mg/dL
Hgb urine dipstick: NEGATIVE
Ketones, ur: NEGATIVE mg/dL
Leukocytes,Ua: NEGATIVE
Nitrite: NEGATIVE
Protein, ur: NEGATIVE mg/dL
Specific Gravity, Urine: 1.01 (ref 1.005–1.030)
pH: 7 (ref 5.0–8.0)

## 2021-02-08 LAB — RESP PANEL BY RT-PCR (FLU A&B, COVID) ARPGX2
Influenza A by PCR: NEGATIVE
Influenza B by PCR: NEGATIVE
SARS Coronavirus 2 by RT PCR: NEGATIVE

## 2021-02-08 LAB — PROTIME-INR
INR: 1 (ref 0.8–1.2)
Prothrombin Time: 13.6 seconds (ref 11.4–15.2)

## 2021-02-08 LAB — APTT: aPTT: 28 seconds (ref 24–36)

## 2021-02-08 LAB — PREGNANCY, URINE: Preg Test, Ur: NEGATIVE

## 2021-02-08 LAB — HEPARIN LEVEL (UNFRACTIONATED): Heparin Unfractionated: 0.48 IU/mL (ref 0.30–0.70)

## 2021-02-08 LAB — LIPASE, BLOOD: Lipase: 30 U/L (ref 11–51)

## 2021-02-08 MED ORDER — BUDESONIDE 0.25 MG/2ML IN SUSP
0.2500 mg | Freq: Two times a day (BID) | RESPIRATORY_TRACT | Status: DC
  Administered 2021-02-08 – 2021-02-09 (×2): 0.25 mg via RESPIRATORY_TRACT
  Filled 2021-02-08 (×2): qty 2

## 2021-02-08 MED ORDER — HEPARIN BOLUS VIA INFUSION
3500.0000 [IU] | Freq: Once | INTRAVENOUS | Status: AC
  Administered 2021-02-08: 3500 [IU] via INTRAVENOUS
  Filled 2021-02-08: qty 3500

## 2021-02-08 MED ORDER — ONDANSETRON HCL 4 MG/2ML IJ SOLN
4.0000 mg | Freq: Once | INTRAMUSCULAR | Status: AC
  Administered 2021-02-08: 4 mg via INTRAVENOUS
  Filled 2021-02-08: qty 2

## 2021-02-08 MED ORDER — MORPHINE SULFATE (PF) 2 MG/ML IV SOLN
2.0000 mg | INTRAVENOUS | Status: DC | PRN
  Administered 2021-02-08: 2 mg via INTRAVENOUS
  Filled 2021-02-08: qty 1

## 2021-02-08 MED ORDER — ONDANSETRON HCL 4 MG/2ML IJ SOLN: 4.0000 mg | Freq: Four times a day (QID) | INTRAMUSCULAR | Status: DC | PRN

## 2021-02-08 MED ORDER — SODIUM CHLORIDE 0.9 % IV BOLUS
1000.0000 mL | Freq: Once | INTRAVENOUS | Status: AC
  Administered 2021-02-08: 1000 mL via INTRAVENOUS

## 2021-02-08 MED ORDER — SODIUM CHLORIDE 0.9 % IV SOLN: Freq: Once | INTRAVENOUS | Status: AC

## 2021-02-08 MED ORDER — FLUTICASONE FUROATE 100 MCG/ACT IN AEPB: INHALATION_SPRAY | Freq: Every day | RESPIRATORY_TRACT | Status: DC

## 2021-02-08 MED ORDER — CYCLOBENZAPRINE HCL 10 MG PO TABS
10.0000 mg | ORAL_TABLET | Freq: Two times a day (BID) | ORAL | Status: DC | PRN
  Administered 2021-02-08 – 2021-02-10 (×4): 10 mg via ORAL
  Filled 2021-02-08 (×6): qty 1

## 2021-02-08 MED ORDER — TOPIRAMATE 25 MG PO TABS
25.0000 mg | ORAL_TABLET | Freq: Every evening | ORAL | Status: DC | PRN
  Filled 2021-02-08: qty 1

## 2021-02-08 MED ORDER — ASPIRIN 81 MG PO CHEW
81.0000 mg | CHEWABLE_TABLET | Freq: Every day | ORAL | Status: DC
  Administered 2021-02-09 – 2021-02-10 (×2): 81 mg via ORAL
  Filled 2021-02-08 (×2): qty 1

## 2021-02-08 MED ORDER — MORPHINE SULFATE (PF) 4 MG/ML IV SOLN
4.0000 mg | Freq: Once | INTRAVENOUS | Status: AC
  Administered 2021-02-08: 4 mg via INTRAVENOUS
  Filled 2021-02-08: qty 1

## 2021-02-08 MED ORDER — SUMATRIPTAN SUCCINATE 50 MG PO TABS
50.0000 mg | ORAL_TABLET | ORAL | Status: DC | PRN
  Filled 2021-02-08: qty 1

## 2021-02-08 MED ORDER — ONDANSETRON HCL 4 MG PO TABS: 4.0000 mg | ORAL_TABLET | Freq: Four times a day (QID) | ORAL | Status: DC | PRN

## 2021-02-08 MED ORDER — HEPARIN (PORCINE) 25000 UT/250ML-% IV SOLN
1100.0000 [IU]/h | INTRAVENOUS | Status: DC
  Administered 2021-02-08: 1000 [IU]/h via INTRAVENOUS
  Administered 2021-02-09: 1100 [IU]/h via INTRAVENOUS
  Filled 2021-02-08 (×3): qty 250

## 2021-02-08 MED ORDER — ALPRAZOLAM 0.5 MG PO TABS
0.5000 mg | ORAL_TABLET | Freq: Two times a day (BID) | ORAL | Status: DC | PRN
  Filled 2021-02-08: qty 1

## 2021-02-08 MED ORDER — IOHEXOL 350 MG/ML SOLN
100.0000 mL | Freq: Once | INTRAVENOUS | Status: AC | PRN
  Administered 2021-02-08: 100 mL via INTRAVENOUS
  Filled 2021-02-08: qty 100

## 2021-02-08 MED ORDER — ASPIRIN 81 MG PO CHEW
324.0000 mg | CHEWABLE_TABLET | Freq: Once | ORAL | Status: AC
  Administered 2021-02-08: 324 mg via ORAL
  Filled 2021-02-08: qty 4

## 2021-02-08 MED ORDER — ALBUTEROL SULFATE (2.5 MG/3ML) 0.083% IN NEBU: 3.0000 mL | INHALATION_SOLUTION | Freq: Four times a day (QID) | RESPIRATORY_TRACT | Status: DC | PRN

## 2021-02-08 NOTE — ED Provider Notes (Signed)
[facility] Provider Note    Event Date/Time   First MD Initiated Contact with Patient 02/08/21 1351     (approximate)   History   Abdominal Pain and Back Pain   HPI  Isabel Foley is a 51 y.o. female with a history of hypothyroidism, depression, GERD who comes to the ED complaining of a sudden onset of tearing mid back pain radiating through to the upper abdomen that started yesterday while she was turning her body while sitting in a chair.  Denies chest pain or trouble breathing.  No fever.  No vomiting.  Never had pain like this before.  It is 8/10 in intensity.  No extremity weakness or paresthesia.     Physical Exam   Triage Vital Signs: ED Triage Vitals  Enc Vitals Group     BP 02/08/21 1032 132/81     Pulse Rate 02/08/21 1032 87     Resp 02/08/21 1032 16     Temp 02/08/21 1032 98.5 F (36.9 C)     Temp Source 02/08/21 1032 Oral     SpO2 02/08/21 1032 99 %     Weight 02/08/21 1032 127 lb (57.6 kg)     Height 02/08/21 1032 5\' 2"  (1.575 m)     Head Circumference --      Peak Flow --      Pain Score 02/08/21 1150 8     Pain Loc --      Pain Edu? --      Excl. in [place]? --     Most recent vital signs: Vitals:   02/08/21 1519 02/08/21 1539  BP:  126/90  Pulse: 81 94  Resp: 18   Temp:    SpO2: 99% 100%     General: Awake, no distress.  CV:  Good peripheral perfusion.  Regular rate and rhythm.  Normal pulses in 4 extremities Resp:  Normal effort.  Clear to auscultation bilaterally Abd:  No distention.  There is epigastric tenderness.  No peritoneal signs Other:  No midline spinal tenderness.  No reproducible musculoskeletal tenderness in the back.   ED Results / Procedures / Treatments   Labs (all labs ordered are listed, but only abnormal results are displayed) Labs Reviewed  COMPREHENSIVE METABOLIC PANEL - Abnormal; Notable for the following components:      Result Value   Glucose, Bld 106 (*)    Calcium 8.8 (*)    Anion  gap 4 (*)    All other components within normal limits  CBC - Abnormal; Notable for the following components:   WBC 17.2 (*)    All other components within normal limits  RESP PANEL BY RT-PCR (FLU A&B, COVID) ARPGX2  LIPASE, BLOOD  URINALYSIS, ROUTINE W REFLEX MICROSCOPIC  PREGNANCY, URINE  APTT  PROTIME-INR  HEPARIN LEVEL (UNFRACTIONATED)  HIV ANTIBODY (ROUTINE TESTING W REFLEX)  CBC  BASIC METABOLIC PANEL     EKG     RADIOLOGY CT abdomen pelvis viewed and interpreted by me, no obvious aortic injury.  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    PROCEDURES:  Critical Care performed: Yes, see critical care procedure note(s)  .Critical Care Performed by: Carrie Mew, MD Authorized by: Carrie Mew, MD   Critical care provider statement:    Critical care time (minutes):  35   Critical care time was exclusive of:  Separately billable procedures and treating other patients   Critical care was necessary to treat or prevent imminent  or life-threatening deterioration of the following conditions:  Circulatory failure   Critical care was time spent personally by me on the following activities:  Development of treatment plan with patient or surrogate, discussions with consultants, evaluation of patient's response to treatment, examination of patient, obtaining history from patient or surrogate, ordering and performing treatments and interventions, ordering and review of laboratory studies, ordering and review of radiographic studies, pulse oximetry, re-evaluation of patient's condition and review of old charts   Care discussed with: admitting provider     [facility]: Medications  heparin ADULT infusion 100 units/mL (25000 units/272mL) (1,000 Units/hr Intravenous New Bag/Given 02/08/21 1416)  SUMAtriptan (IMITREX) tablet 50 mg (has no administration in time range)  ALPRAZolam (XANAX) tablet 0.5 mg (has no  administration in time range)  cyclobenzaprine (FLEXERIL) tablet 10 mg (has no administration in time range)  topiramate (TOPAMAX) tablet 25 mg (has no administration in time range)  albuterol (PROVENTIL) (2.5 MG/3ML) 0.083% nebulizer solution 3 mL (has no administration in time range)  morphine (PF) 2 MG/ML injection 2 mg (2 mg Intravenous Given 02/08/21 1709)  ondansetron (ZOFRAN) tablet 4 mg (has no administration in time range)    Or  ondansetron (ZOFRAN) injection 4 mg (has no administration in time range)  aspirin chewable tablet 81 mg (has no administration in time range)  budesonide (PULMICORT) nebulizer solution 0.25 mg (has no administration in time range)  sodium chloride 0.9 % bolus 1,000 mL (0 mLs Intravenous Stopped 02/08/21 1258)  morphine (PF) 4 MG/ML injection 4 mg (4 mg Intravenous Given 02/08/21 1150)  ondansetron (ZOFRAN) injection 4 mg (4 mg Intravenous Given 02/08/21 1149)  iohexol (OMNIPAQUE) 350 MG/ML injection 100 mL (100 mLs Intravenous Contrast Given 02/08/21 1159)  aspirin chewable tablet 324 mg (324 mg Oral Given 02/08/21 1348)  heparin bolus via infusion 3,500 Units (3,500 Units Intravenous Bolus from Bag 02/08/21 1417)  0.9 %  sodium chloride infusion ( Intravenous New Bag/Given 02/08/21 1413)     IMPRESSION / MDM / ASSESSMENT AND PLAN / ED COURSE  I reviewed the triage vital signs and the nursing notes.                              Differential diagnosis includes, but is not limited to, aortic dissection, aneurysm, vascular thrombosis, lumbar strain, psoas strain/hematoma  Patient presents with abrupt onset of severe radiating pain after twisting body motion yesterday.  Its been constant.  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  Discussed with vascular surgery Dr. Delana Meyer who recommends heparin, aspirin, tight blood pressure control to keep it  within normal limits, beta-blockers as needed.  He will evaluate patient this afternoon.  Patient and spouse at bedside updated on findings and plan of care and agree.  Case discussed with hospitalist.  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   Carrie Mew, MD 02/08/21 [phone]

## 2021-02-08 NOTE — Telephone Encounter (Signed)
I spoke with pt; pt said she has never had this type pain before;pt was at orthodontist office on 02/07/21. Pt said orthodontist was behind her and pt had to turn body to talk with orthodontist pt started with mid dull crampy back pain and mid upper abd pain. Pt said she did not think pain radiated from front to back it was like 2 separate severe pains. Pt said since the pain started it has been constant at pain levels from 6 to 8 to 10.pt has nausea but no vomiting. Pt was not able to sleep last night due to pain; pt said she did have mid chest pain and SOB last night. Pt  tried motrin and heating pad which did not help pain; pt tried pepto bismol which did not help pain and this morning pt tried tylenol which did not help pain. This morning pts temp was 99.4 with chills. Pt said now pain level in back and upper abd is constant 8. Pt said she does not want to go from one office to another so if I think she may need some testing or imaging pt is going to [facility] ED. I advised if her pain level is that high I think after eval may need some type of testing such as lab testing, xrays or imaging. Pt said she will just go to [facility] ED shortly. Sending note to Dr Diona Browner and Butch Penny CMA.

## 2021-02-08 NOTE — ED Notes (Signed)
Pt reports "my pain is significantly better; 5/10." EDP Stafford notified in person. Stafford contacted vascular team in relation to pt's CT. EDP will update pt and her family soon.

## 2021-02-08 NOTE — H&P (Signed)
History and Physical    Patient: Isabel Foley ZOX:[id number] DOB: [birth year] DOA: 02/08/2021 DOS: the patient was seen and examined on 02/08/2021 PCP: Jinny Sanders, MD  Patient coming from: Home  Chief Complaint:  Chief Complaint  Patient presents with   Abdominal Pain   Back Pain    HPI: Isabel Foley is a 51 y.o. female with medical history significant for GERD, hypothyroidism and chronic pain syndrome who presented to the emergency room for evaluation of sudden onset back and mid abdominal pain which started while she was at the orthodontist office after she turned the wrong way 1 day prior to her admission.  Pain has been constant and rated an 8-10 in intensity at its worst.  There has been no alleviating or aggravating factors and patient states that symptoms have been associated with nausea but no vomiting.  She also said she had chills at home with a Tmax of 99. She denies having any shortness of breath, no palpitations, no diaphoresis, no hematochezia, no melena stools, no hematemesis, no leg swelling, no dizziness or lightheadedness.   Review of Systems: As mentioned in the history of present illness. All other systems reviewed and are negative. Past Medical History:  Diagnosis Date   Abdominal pain, right upper quadrant    Allergic rhinitis, cause unspecified    Depressive disorder, not elsewhere classified    Dizziness and giddiness    Esophageal reflux    Nontoxic multinodular goiter    Other chronic sinusitis    Other malaise and fatigue    Pain in limb    Peptic ulcer, unspecified site, unspecified as acute or chronic, without mention of hemorrhage, perforation, or obstruction    Premenstrual tension syndromes    Pure hypercholesterolemia    Unspecified hypothyroidism    Past Surgical History:  Procedure Laterality Date   BREAST BIOPSY Right 06/01/2015   stereo, path pending   [place]  2002   LEEP  2000   Social History:  reports  that she has never smoked. She has never used smokeless tobacco. She reports that she does not drink alcohol and does not use drugs.  No Known Allergies  Family History  Problem Relation Age of Onset   Hypertension Father    Hypothyroidism Father    Atrial fibrillation Brother 104   Other Sister        Collagen Vascular Disease   Hyperthyroidism Mother    Breast cancer Cousin     Prior to Admission medications   Medication Sig Start Date End Date Taking? Authorizing Provider  cetirizine (ZYRTEC) 10 MG tablet Take 10 mg by mouth daily. 04/22/19  Yes [provider]  Cholecalciferol (VITAMIN D3) 10 MCG (400 UNIT) CAPS Take 1 Dose by mouth daily. Unsure of dose she takes   Yes [provider]  COLLAGEN PO Take 1 Dose by mouth daily.   Yes [provider]  fexofenadine (ALLEGRA) 180 MG tablet Take 180 mg by mouth daily as needed.  05/21/17  Yes [provider]  ipratropium (ATROVENT) 0.06 % nasal spray Place 2 sprays into the nose daily. 09/11/20  Yes [provider]  montelukast (SINGULAIR) 10 MG tablet Take 10 mg by mouth at bedtime.   Yes [provider]  vitamin B-12 (CYANOCOBALAMIN) 500 MCG tablet Take 500 mcg by mouth daily. Unsure of dose she takes daily   Yes [provider]  albuterol (VENTOLIN HFA) 108 (90 Base) MCG/ACT inhaler ProAir HFA 90 mcg/actuation aerosol  inhaler  INHALE 2 PUFFS BY MOUTH EVERY 4-6 HOURS AS NEEDED FOR COUGH/WHEEZE    [provider]  ALPRAZolam Duanne Moron) 0.5 MG tablet Can use  1 tab daily  as needed for anxiety on long trips. 03/21/20   Bedsole, Amy E, MD  ARNUITY ELLIPTA 100 MCG/ACT AEPB INHALE 1 PUFF BY MOUTH DAILY 04/08/17   [provider]  azelastine (OPTIVAR) 0.05 % ophthalmic solution Place 1 drop into both eyes daily as needed.     [provider]  azithromycin (ZITHROMAX) 250 MG tablet 2 tab po x 1 day then 1 tab po daily Patient not taking: Reported on 02/08/2021 03/21/20    Jinny Sanders, MD  benzonatate (TESSALON) 200 MG capsule Take 1 capsule (200 mg total) by mouth 3 (three) times daily as needed for cough. 03/21/20   Bedsole, Amy E, MD  cyclobenzaprine (FLEXERIL) 10 MG tablet TAKE 1 TABLET BY MOUTH AT BEDTIME AS NEEDED FOR MUSCLE SPASMS 11/26/19   Bedsole, Amy E, MD  diclofenac (VOLTAREN) 75 MG EC tablet Take 1 tablet (75 mg total) by mouth 2 (two) times daily. Patient not taking: Reported on 02/08/2021 10/26/19   Jinny Sanders, MD  Efinaconazole (JUBLIA) 10 % SOLN Apply QHS to affected toenails Patient not taking: Reported on 02/08/2021 11/17/19   [name], MD  fluconazole (DIFLUCAN) 150 MG tablet Take 1 tablet (150 mg total) by mouth daily. Repeat in 7 days if needed Patient not taking: Reported on 02/08/2021 11/17/19   [name], MD  fluticasone [facility]) 50 MCG/ACT nasal spray Place into both nostrils daily. Patient not taking: Reported on 02/08/2021    [provider]  mometasone (ELOCON) 0.1 % cream Apply 1 application topically 2 (two) times daily. Prn flares 01/02/21   [name], MD  Multiple Vitamin (MULTIVITAMIN) tablet Take 1 tablet by mouth daily. Patient not taking: Reported on 02/08/2021    [provider]  Probiotic Product (PROBIOTIC FORMULA PO) Take 1 tablet by mouth daily. Patient not taking: Reported on 02/08/2021    [provider]  rizatriptan (MAXALT) 10 MG tablet TAKE 1 TABLET (10 MG TOTAL) BY MOUTH AS NEEDED FOR MIGRAINE. MAY REPEAT IN 2 HOURS IF NEEDED 06/18/16   Bedsole, Amy E, MD  topiramate (TOPAMAX) 25 MG tablet Take 1 tablet (25 mg total) by mouth at bedtime as needed. 06/25/18   Bedsole, Amy E, MD  tranexamic acid (LYSTEDA) 650 MG TABS tablet TAKE 2 TABLETS BY MOUTH 3 TIMES DAILY. TAKE DURING MENSES FOR MAXIMUM OF 5 DAYS 01/02/21   Jinny Sanders, MD    Physical Exam: Vitals:   02/08/21 1155 02/08/21 1317 02/08/21 1318 02/08/21 1330  BP:  117/80  128/89  Pulse: 85  63 65  Resp:      Temp:       TempSrc:      SpO2: 100%  100% 99%  Weight:      Height:       General: Awake, no distress.  CV:  Good peripheral perfusion. RRR S1,S2 Resp: Normal effort. CTAB/L Abd:   No distention. BS+, soft, non tender Other:    No leg swelling  Data Reviewed: Patient had a white count of 17,000 of unclear significance. CT angiogram shows a dissection of the proximal superior mesenteric artery approximately 2 cm from the vessel ostium which extends into the dominant first-order branch in the left hemiabdomen and terminates. The vessel is not overtly aneurysmal, measuring no greater than 0.8 cm in caliber. No  evidence of bowel ischemia  There are no new results to review at this time.  Assessment and Plan:  Principal Problem:   Dissection of mesenteric artery (HCC) Active Problems:   History of hypothyroidism   Depression, major, in remission ([place])   GERD    Dissection of superior mesenteric artery Noted on CT angiogram done for evaluation of sudden onset acute back and abdominal pain. Vascular surgery consulted by ER physician who recommends heparin drip and aspirin for now. Pain control   History of anxiety Continue as needed Xanax  Advance Care Planning:   Code Status: Not on file Full code  Consults: Vascular surgery  Family Communication: Greater than 50% of time was spent discussing patient's condition and plan of care with her and her husband at the bedside.  All questions and concerns have been addressed.  They verbalized understanding and agree with the plan.  Severity of Illness: The appropriate patient status for this patient is INPATIENT. Inpatient status is judged to be reasonable and necessary in order to provide the required intensity of service to ensure the patient's safety. The patient's presenting symptoms, physical exam findings, and initial radiographic and laboratory data in the context of their chronic comorbidities is felt to place them at high risk for  further clinical deterioration. Furthermore, it is not anticipated that the patient will be medically stable for discharge from the hospital within 2 midnights of admission.   * I certify that at the point of admission it is my clinical judgment that the patient will require inpatient hospital care spanning beyond 2 midnights from the point of admission due to high intensity of service, high risk for further deterioration and high frequency of surveillance required.*  Author: Collier Bullock, MD 02/08/2021 2:59 PM  For on call review [email or web address].

## 2021-02-08 NOTE — Progress Notes (Signed)
[facility] for heparin infusion Indication: SMA dissection  No Known Allergies  Patient Measurements: Height: 5\' 2"  (157.5 cm) Weight: 57.6 kg (127 lb) IBW/kg (Calculated) : 50.1 Heparin Dosing Weight: 57.6 kg  Vital Signs: Temp: 98.5 F (36.9 C) (02/02 1032) Temp Source: Oral (02/02 1032) BP: 128/89 (02/02 1330) Pulse Rate: 65 (02/02 1330)  Labs: Recent Labs    02/08/21 1031  HGB 12.7  HCT 38.9  PLT 295  CREATININE 0.66    Estimated Creatinine Clearance: 66.5 mL/min (by C-G formula based on SCr of 0.66 mg/dL).   Medical History: Past Medical History:  Diagnosis Date   Abdominal pain, right upper quadrant    Allergic rhinitis, cause unspecified    Depressive disorder, not elsewhere classified    Dizziness and giddiness    Esophageal reflux    Nontoxic multinodular goiter    Other chronic sinusitis    Other malaise and fatigue    Pain in limb    Peptic ulcer, unspecified site, unspecified as acute or chronic, without mention of hemorrhage, perforation, or obstruction    Premenstrual tension syndromes    Pure hypercholesterolemia    Unspecified hypothyroidism     Medications:  Per chart review, no anticoagulation prior to admission  Assessment: 51 yo female complaining of upper back and abdominal discomfort. Pharmacy has been consulted for heparin dosing.   Baseline labs: H&H and Plt wnl; aPTT and PT-INR pending  Goal of Therapy:  Heparin level 0.3-0.7 units/ml Monitor platelets by anticoagulation protocol: Yes   Plan:  Give 3500 units bolus x 1 Start heparin infusion at 1000 units/hr Check anti-Xa level in 6 hours and daily while on heparin Continue to monitor H&H and platelets  Bailey Kolbe O Allisyn Kunz 02/08/2021,1:45 PM

## 2021-02-08 NOTE — ED Notes (Signed)
Pt placed on cardiac monitor. Pt currently in NSR at 92BPM.

## 2021-02-08 NOTE — Telephone Encounter (Signed)
Noted  

## 2021-02-08 NOTE — ED Notes (Signed)
Called lab and Isabel Foley stated she'll add on a urine preg now.

## 2021-02-08 NOTE — Consult Note (Signed)
@LOGO @   MRN : [id number]  Isabel Foley is a 51 y.o. ([birth year]) female who presents with chief complaint of superior mesenteric artery dissection.  History of Present Illness:   I am asked to evaluate the patient by Dr. Joni Fears.  Patient is a 51 year old woman who presented to [facility] approximately 24 hours after experiencing the abrupt onset of back pain that radiated around her side toward the epigastrium.  She noted that this seemed to be acute in onset and associated with trying to turn her torso toward the orthodontist.  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  Symptoms are associated with nausea but no vomiting.  She is not experiencing any relief with any repositioning although laying flat certainly made it much worse.  Because the pain had not improved she came to the emergency room.  CT angiogram was performed which has been reviewed by me personally and demonstrates an isolated mesenteric artery dissection.  Current Meds  Medication Sig   cetirizine (ZYRTEC) 10 MG tablet Take 10 mg by mouth daily.   Cholecalciferol (VITAMIN D3) 10 MCG (400 UNIT) CAPS Take 1 Dose by mouth daily. Unsure of dose she takes   COLLAGEN PO Take 1 Dose by mouth daily.   fexofenadine (ALLEGRA) 180 MG tablet Take 180 mg by mouth daily as needed.    ipratropium (ATROVENT) 0.06 % nasal spray Place 2 sprays into the nose daily.   montelukast (SINGULAIR) 10 MG tablet Take 10 mg by mouth at bedtime.   vitamin B-12 (CYANOCOBALAMIN) 500 MCG tablet Take 500 mcg by mouth daily. Unsure of dose she takes daily    Past Medical History:  Diagnosis Date   Abdominal pain, right upper quadrant    Allergic rhinitis, cause unspecified    Depressive disorder, not elsewhere classified    Dizziness and giddiness    Esophageal reflux    Nontoxic multinodular goiter    Other chronic sinusitis    Other malaise and fatigue    Pain in limb    Peptic ulcer,  unspecified site, unspecified as acute or chronic, without mention of hemorrhage, perforation, or obstruction    Premenstrual tension syndromes    Pure hypercholesterolemia    Unspecified hypothyroidism     Past Surgical History:  Procedure Laterality Date   BREAST BIOPSY Right 06/01/2015   stereo, path pending   DILATION AND CURETTAGE OF UTERUS  2002   LEEP  2000    Social History Social History   Tobacco Use   Smoking status: Never   Smokeless tobacco: Never  Substance Use Topics   Alcohol use: No   Drug use: No    Family History Family History  Problem Relation Age of Onset   Hypertension Father    Hypothyroidism Father    Atrial fibrillation Brother 50   Other Sister        Collagen Vascular Disease   Hyperthyroidism Mother    Breast cancer Cousin     No Known Allergies   REVIEW OF SYSTEMS (Negative unless checked)  Constitutional: [] Weight loss  [] Fever  [] Chills Cardiac: [] Chest pain   [] Chest pressure   [] Palpitations   [] Shortness of breath when laying flat   [] Shortness of breath with exertion. Vascular:  [] Pain in legs with walking   [] Pain in legs at rest  [] History of DVT   [] Phlebitis   [] Swelling in legs   [] Varicose veins   [] Non-healing ulcers Pulmonary:   [] Uses home oxygen   []   Productive cough   [] Hemoptysis   [] Wheeze  [] COPD   [] Asthma Neurologic:  [] Dizziness   [] Seizures   [] History of stroke   [] History of TIA  [] Aphasia   [] Vissual changes   [] Weakness or numbness in arm   [] Weakness or numbness in leg Musculoskeletal:   [] Joint swelling   [] Joint pain   [] Low back pain Hematologic:  [] Easy bruising  [] Easy bleeding   [] Hypercoagulable state   [] Anemic Gastrointestinal:  [] Diarrhea   [] Vomiting  [] Gastroesophageal reflux/heartburn   [] Difficulty swallowing. Genitourinary:  [] Chronic kidney disease   [] Difficult urination  [] Frequent urination   [] Blood in urine Skin:  [] Rashes   [] Ulcers  Psychological:  [] History of anxiety   [x]  History of  major depression.  Physical Examination  Vitals:   02/08/21 1430 02/08/21 1500 02/08/21 1519 02/08/21 1539  BP:    126/90  Pulse: 80 95 81 94  Resp: 16 18 18    Temp:      TempSrc:      SpO2: 100% 100% 99% 100%  Weight:      Height:       Body mass index is 23.23 kg/m. Gen: WD/WN, NAD Head: [place]/AT, No temporalis wasting.  Ear/Nose/Throat: Hearing grossly intact, nares w/o erythema or drainage Eyes: PER, EOMI, sclera nonicteric.  Neck: Supple, no masses.  No bruit or JVD.  Pulmonary:  Good air movement, no audible wheezing, no use of accessory muscles.  Cardiac: RRR, normal S1, S2, no Murmurs. Vascular:   Vessel Right Left  Radial Palpable Palpable  Gastrointestinal: soft, non-distended. No guarding/no peritoneal signs.  Musculoskeletal: M/S 5/5 throughout.  No visible deformity.  Neurologic: CN 2-12 intact. Pain and light touch intact in extremities.  Symmetrical.  Speech is fluent. Motor exam as listed above. Psychiatric: Judgment intact, Mood & affect appropriate for pt's clinical situation. Dermatologic: No rashes or ulcers noted.  No changes consistent with cellulitis.   CBC Lab Results  Component Value Date   WBC 17.2 (H) 02/08/2021   HGB 12.7 02/08/2021   HCT 38.9 02/08/2021   MCV 95.6 02/08/2021   PLT 295 02/08/2021    BMET    Component Value Date/Time   NA 135 02/08/2021 1031   NA 138 08/05/2019 1401   K 3.6 02/08/2021 1031   CL 104 02/08/2021 1031   CO2 27 02/08/2021 1031   GLUCOSE 106 (H) 02/08/2021 1031   BUN 14 02/08/2021 1031   BUN 20 08/05/2019 1401   CREATININE 0.66 02/08/2021 1031   CALCIUM 8.8 (L) 02/08/2021 1031   GFRNONAA >60 02/08/2021 1031   GFRAA 98 08/05/2019 1401   Estimated Creatinine Clearance: 66.5 mL/min (by C-G formula based on SCr of 0.66 mg/dL).  COAG Lab Results  Component Value Date   INR 1.0 02/08/2021    Radiology MM 3D SCREEN BREAST BILATERAL  Result Date: 01/15/2021 CLINICAL DATA:  Screening. EXAM: DIGITAL  SCREENING BILATERAL MAMMOGRAM WITH TOMOSYNTHESIS AND CAD TECHNIQUE: Bilateral screening digital craniocaudal and mediolateral oblique mammograms were obtained. Bilateral screening digital breast tomosynthesis was performed. The images were evaluated with computer-aided detection. COMPARISON:  Previous exam(s). ACR Breast Density Category d: The breast tissue is extremely dense, which lowers the sensitivity of mammography FINDINGS: There are no findings suspicious for malignancy. IMPRESSION: No mammographic evidence of malignancy. A result letter of this screening mammogram will be mailed directly to the patient. RECOMMENDATION: Screening mammogram in one year. (Code:SM-B-01Y) BI-RADS CATEGORY  1: Negative. Electronically Signed   By: Abelardo Diesel M.D.   On: 01/15/2021  13:10  CT Angio Chest/Abd/Pel for Dissection W and/or Wo Contrast  Result Date: 02/08/2021 CLINICAL DATA:  Sudden onset upper back and upper abdominal pain with nausea since yesterday, aortic dissection suspected EXAM: CT ANGIOGRAPHY CHEST, ABDOMEN AND PELVIS TECHNIQUE: Non-contrast CT of the chest was initially obtained. Multidetector CT imaging through the chest, abdomen and pelvis was performed using the standard protocol during bolus administration of intravenous contrast. Multiplanar reconstructed images and MIPs were obtained and reviewed to evaluate the vascular anatomy. RADIATION DOSE REDUCTION: This exam was performed according to the departmental dose-optimization program which includes automated exposure control, adjustment of the mA and/or kV according to patient size and/or use of iterative reconstruction technique. CONTRAST:  123mL OMNIPAQUE IOHEXOL 350 MG/ML SOLN COMPARISON:  None. FINDINGS: CTA CHEST FINDINGS VASCULAR Aorta: Satisfactory opacification of the aorta. Normal contour and caliber of the thoracic aorta. No evidence of aneurysm, dissection, or other acute aortic pathology. Cardiovascular: No evidence of pulmonary embolism  on limited non-tailored examination. Normal heart size. No pericardial effusion. Review of the MIP images confirms the above findings. NON VASCULAR Mediastinum/Nodes: No enlarged mediastinal, hilar, or axillary lymph nodes. Thyroid gland, trachea, and esophagus demonstrate no significant findings. Lungs/Pleura: 0.3 cm subpleural nodule of the dependent right lower lobe (series 7, image 76). No pleural effusion or pneumothorax. Musculoskeletal: No chest wall abnormality. No acute osseous findings. Review of the MIP images confirms the above findings. CTA ABDOMEN AND PELVIS FINDINGS VASCULAR Normal contour and caliber of the abdominal aorta. No evidence of aneurysm, aortic dissection, or other acute aortic pathology. Standard branching pattern of the abdominal aorta with solitary bilateral renal arteries. There is a dissection of the proximal superior mesenteric artery, approximately 2 cm from the vessel ostium, which extends into the dominant first order branch in the left hemiabdomen and terminates (series 5, image 92, series 9, image 132). The vessel is not overtly aneurysmal, measuring no greater than 0.8 cm in caliber. The identifiable distal branches are opacified. Review of the MIP images confirms the above findings. NON-VASCULAR Hepatobiliary: Brightly enhancing lesion of the central liver dome, hepatic segment VIII, measuring 2.0 x 1.3 cm (series 5, image 73). Tiny gallstone in the dependent gallbladder. Gallbladder wall thickening, or biliary dilatation. Pancreas: Unremarkable. No pancreatic ductal dilatation or surrounding inflammatory changes. Spleen: Normal in size without significant abnormality. Adrenals/Urinary Tract: Adrenal glands are unremarkable. Kidneys are normal, without renal calculi, solid lesion, or hydronephrosis. Bladder is unremarkable. Stomach/Bowel: Stomach is within normal limits. Appendix appears normal. No evidence of bowel wall thickening, distention, or inflammatory changes.  Lymphatic: No enlarged abdominal or pelvic lymph nodes. Reproductive: Uterine fibroids. Bilateral ovarian cysts, measuring up to 3.0 x 2.1 cm on the right (series 5, image 163). No follow-up imaging recommended. Note: This recommendation does not apply to premenarchal patients and to those with increased risk (genetic, family history, elevated tumor markers or other high-risk factors) of ovarian cancer. Reference: JACR 2020 Feb; 17(2):248-254 Other: No abdominal wall hernia or abnormality. No ascites. Musculoskeletal: No acute osseous findings. IMPRESSION: 1. No evidence of aortic aneurysm, aortic dissection, or other acute aortic pathology. 2. There is a dissection of the proximal superior mesenteric artery, approximately 2 cm from the vessel ostium, which extends into the dominant first order branch in the left hemiabdomen and terminates. The vessel is not overtly aneurysmal, measuring no greater than 0.8 cm in caliber. Both the true and false lumen as well as the identifiable distal branches are opacified. 3. No evidence of bowel ischemia. 4. Brightly enhancing  lesion of the central liver dome, hepatic segment VIII, measuring 2.0 x 1.3 cm. This may reflect a flash filling hemangioma or focal nodular hyperplasia, however is incompletely characterized. As hepatic adenoma and hepatocellular carcinoma are general differential considerations, recommend multiphasic contrast enhanced MRI to fully characterize on a nonemergent, outpatient basis. 5. Uterine fibroids. Electronically Signed   By: Delanna Ahmadi M.D.   On: 02/08/2021 12:26     Assessment/Plan  Mesenteric artery dissection: Patient has been admitted we have initiated a heparin drip.  After 36 to 48 hours we will transition to Plavix and aspirin.  Follow-up CT scan will be obtained prior to this transition to ensure that the dissection is stable.  Her diet will be advanced to ensure that she is not experiencing any symptoms consistent with mesenteric  ischemia.  Her dissection will ultimately be followed with ultrasound.  She will have routine follow-up in my office.  At this point surgery MRI and or intervention is not indicated.  2.  DJD of the spine: Given her documented degenerative joint disease of the cervical spine in association with the abrupt onset associated with movement prior to her discharge I will obtain an MRI of her thoracic spine.  It is possible that her dissection is chronic and an incidental finding.  3.  GERD: Continue PPI as already ordered, this medication has been reviewed and there are no changes at this time.  Avoidence of caffeine and alcohol  Moderate elevation of the head of the bed    4.  Hyperlipidemia: Continue statin as ordered and reviewed, no changes at this time   Hortencia Pilar, MD  02/08/2021 7:51 PM

## 2021-02-08 NOTE — ED Triage Notes (Signed)
Pt states she was at the orthodontist yesterday and had sudden upper back pain and upper abd pain with nausea since

## 2021-02-08 NOTE — ED Notes (Signed)
This RN transported pt to floor on cardiac monitor. Unit secretary notified of pt's arrival to room. Pt's husband remains with her. Pt educated on new call bell button. Bed locked low. Rail up.

## 2021-02-08 NOTE — Progress Notes (Signed)
[place] for heparin infusion Indication: SMA dissection  No Known Allergies  Patient Measurements: Height: 5\' 2"  (157.5 cm) Weight: 57.6 kg (127 lb) IBW/kg (Calculated) : 50.1 Heparin Dosing Weight: 57.6 kg  Vital Signs: Temp: 98.5 F (36.9 C) (02/02 1032) Temp Source: Oral (02/02 1032) BP: 128/89 (02/02 1330) Pulse Rate: 65 (02/02 1330)  Labs: Recent Labs    02/08/21 1031  HGB 12.7  HCT 38.9  PLT 295  CREATININE 0.66     Estimated Creatinine Clearance: 66.5 mL/min (by C-G formula based on SCr of 0.66 mg/dL).   Medical History: Past Medical History:  Diagnosis Date   Abdominal pain, right upper quadrant    Allergic rhinitis, cause unspecified    Depressive disorder, not elsewhere classified    Dizziness and giddiness    Esophageal reflux    Nontoxic multinodular goiter    Other chronic sinusitis    Other malaise and fatigue    Pain in limb    Peptic ulcer, unspecified site, unspecified as acute or chronic, without mention of hemorrhage, perforation, or obstruction    Premenstrual tension syndromes    Pure hypercholesterolemia    Unspecified hypothyroidism     Medications:  Per chart review, no anticoagulation prior to admission  Assessment: 51 yo female complaining of upper back and abdominal discomfort. Vascular surgery consulted by ER physician who recommended heparin drip.  Pharmacy has been consulted for heparin dosing. H&H, platelets wnl  Goal of Therapy:  Heparin level 0.3-0.7 units/ml Monitor platelets by anticoagulation protocol: Yes   Plan:  Heparin level is therapeutic: continue heparin infusion at 1000 units/hr recheck anti-Xa level in 6 hours and daily while on heparin Continue to monitor H&H and platelets  Dallie Piles 02/08/2021,2:38 PM

## 2021-02-08 NOTE — ED Notes (Signed)
See triage note. Pt denies major SOB, reports sudden upper back and abdominal discomfort since dental appointment yesterday as well as nausea at times. Pt steady on feet; was up to restroom right before this RN entered room. Resp reg/unlabored currently; skin dry; laying calmly on stretcher. Visitor remains at bedside.

## 2021-02-09 ENCOUNTER — Inpatient Hospital Stay: Payer: 59

## 2021-02-09 LAB — BASIC METABOLIC PANEL
Anion gap: 5 (ref 5–15)
BUN: 9 mg/dL (ref 6–20)
CO2: 25 mmol/L (ref 22–32)
Calcium: 8.2 mg/dL — ABNORMAL LOW (ref 8.9–10.3)
Chloride: 107 mmol/L (ref 98–111)
Creatinine, Ser: 0.59 mg/dL (ref 0.44–1.00)
GFR, Estimated: >60 mL/min (ref >60)
Glucose, Bld: 82 mg/dL (ref 70–99)
Potassium: 3.9 mmol/L (ref 3.5–5.1)
Sodium: 137 mmol/L (ref 135–145)

## 2021-02-09 LAB — CBC
HCT: 34.8 % — ABNORMAL LOW (ref 36.0–46.0)
Hemoglobin: 11.4 g/dL — ABNORMAL LOW (ref 12.0–15.0)
MCH: 31.3 pg (ref 26.0–34.0)
MCHC: 32.8 g/dL (ref 30.0–36.0)
MCV: 95.6 fL (ref 80.0–100.0)
Platelets: 245 10*3/uL (ref 150–400)
RBC: 3.64 MIL/uL — ABNORMAL LOW (ref 3.87–5.11)
RDW: 13.4 % (ref 11.5–15.5)
WBC: 11.9 10*3/uL — ABNORMAL HIGH (ref 4.0–10.5)
nRBC: 0 % (ref 0.0–0.2)

## 2021-02-09 LAB — HEPARIN LEVEL (UNFRACTIONATED)
Heparin Unfractionated: 0.29 IU/mL — ABNORMAL LOW (ref 0.30–0.70)
Heparin Unfractionated: 0.45 IU/mL (ref 0.30–0.70)
Heparin Unfractionated: 0.32 IU/mL (ref 0.30–0.70)

## 2021-02-09 LAB — HIV ANTIBODY (ROUTINE TESTING W REFLEX): HIV Screen 4th Generation wRfx: NONREACTIVE

## 2021-02-09 MED ORDER — IOHEXOL 350 MG/ML SOLN
100.0000 mL | Freq: Once | INTRAVENOUS | Status: AC | PRN
  Administered 2021-02-09: 100 mL via INTRAVENOUS

## 2021-02-09 MED ORDER — HEPARIN BOLUS VIA INFUSION
850.0000 [IU] | Freq: Once | INTRAVENOUS | Status: AC
  Administered 2021-02-09: 850 [IU] via INTRAVENOUS
  Filled 2021-02-09: qty 850

## 2021-02-09 MED ORDER — ACETAMINOPHEN 500 MG PO TABS
1000.0000 mg | ORAL_TABLET | Freq: Four times a day (QID) | ORAL | Status: DC | PRN
  Administered 2021-02-09 – 2021-02-10 (×2): 1000 mg via ORAL
  Filled 2021-02-09 (×3): qty 2

## 2021-02-09 NOTE — Progress Notes (Signed)
ANTICOAGULATION CONSULT NOTE - Follow up  Pharmacy Consult for heparin infusion Indication: SMA dissection  No Known Allergies  Patient Measurements: Height: 5\' 2"  (157.5 cm) Weight: 57.6 kg (127 lb) IBW/kg (Calculated) : 50.1 Heparin Dosing Weight: 57.6 kg  Vital Signs: Temp: 97.6 F (36.4 C) (02/03 1947) BP: 119/76 (02/03 1947) Pulse Rate: 91 (02/03 1947)  Labs: Recent Labs    02/08/21 1031 02/08/21 1410 02/08/21 1953 02/09/21 0555 02/09/21 1444 02/09/21 2144  HGB 12.7  --   --  11.4*  --   --   HCT 38.9  --   --  34.8*  --   --   PLT 295  --   --  245  --   --   APTT  --  28  --   --   --   --   LABPROT  --  13.6  --   --   --   --   INR  --  1.0  --   --   --   --   HEPARINUNFRC  --   --    < > 0.29* 0.45 0.32  CREATININE 0.66  --   --  0.59  --   --    < > = values in this interval not displayed.     Estimated Creatinine Clearance: 66.5 mL/min (by C-G formula based on SCr of 0.59 mg/dL).   Medical History: Past Medical History:  Diagnosis Date   Abdominal pain, right upper quadrant    Allergic rhinitis, cause unspecified    Depressive disorder, not elsewhere classified    Dizziness and giddiness    Esophageal reflux    Nontoxic multinodular goiter    Other chronic sinusitis    Other malaise and fatigue    Pain in limb    Peptic ulcer, unspecified site, unspecified as acute or chronic, without mention of hemorrhage, perforation, or obstruction    Premenstrual tension syndromes    Pure hypercholesterolemia    Unspecified hypothyroidism     Medications:  Per chart review, no anticoagulation prior to admission  Assessment: 51 yo female complaining of upper back and abdominal discomfort. Pharmacy has been consulted for heparin dosing.   Baseline labs: H&H and Plt wnl; aPTT and PT-INR pending  Date Time HL Rate/Comment  2/02 1953 0.48 1000 units/hr/ therapeutic x1 2/03 0555 0.29 subtherapeutic  2/03 1444  0.45 thera 2/03 2100 0.32 Therapeutic x  2  Goal of Therapy:  Heparin level 0.3-0.7 units/ml Monitor platelets by anticoagulation protocol: Yes   Plan:  Continue drip at 1100 units/hr  Check next heparin level with AM labs Continue to monitor H&H and platelets  Gamal Todisco Rodriguez-Guzman PharmD, BCPS 02/09/2021 10:42 PM

## 2021-02-09 NOTE — Progress Notes (Signed)
[place] for heparin infusion Indication: SMA dissection  No Known Allergies  Patient Measurements: Height: 5\' 2"  (157.5 cm) Weight: 57.6 kg (127 lb) IBW/kg (Calculated) : 50.1 Heparin Dosing Weight: 57.6 kg  Vital Signs: Temp: 98.2 F (36.8 C) (02/03 0749) BP: 108/72 (02/03 0749) Pulse Rate: 88 (02/03 0749)  Labs: Recent Labs    02/08/21 1031 02/08/21 1410 02/08/21 1953 02/09/21 0555  HGB 12.7  --   --  11.4*  HCT 38.9  --   --  34.8*  PLT 295  --   --  245  APTT  --  28  --   --   LABPROT  --  13.6  --   --   INR  --  1.0  --   --   HEPARINUNFRC  --   --  0.48 0.29*  CREATININE 0.66  --   --  0.59     Estimated Creatinine Clearance: 66.5 mL/min (by C-G formula based on SCr of 0.59 mg/dL).   Medical History: Past Medical History:  Diagnosis Date   Abdominal pain, right upper quadrant    Allergic rhinitis, cause unspecified    Depressive disorder, not elsewhere classified    Dizziness and giddiness    Esophageal reflux    Nontoxic multinodular goiter    Other chronic sinusitis    Other malaise and fatigue    Pain in limb    Peptic ulcer, unspecified site, unspecified as acute or chronic, without mention of hemorrhage, perforation, or obstruction    Premenstrual tension syndromes    Pure hypercholesterolemia    Unspecified hypothyroidism     Medications:  Per chart review, no anticoagulation prior to admission  Assessment: 51 yo female complaining of upper back and abdominal discomfort. Pharmacy has been consulted for heparin dosing.   Baseline labs: H&H and Plt wnl; aPTT and PT-INR pending  Date Time HL Rate/Comment  2/02 1953 0.48 1000 units/hr/ therapeutic x1 2/03 0555 0.29 subtherapeutic    Goal of Therapy:  Heparin level 0.3-0.7 units/ml Monitor platelets by anticoagulation protocol: Yes   Plan:  Give bolus of 850 units x1 and increase heparin to 1100 units/hr  Check anti-Xa level in 6 hours and  daily while on heparin Continue to monitor H&H and platelets  Darrick Penna 02/09/2021,8:44 AM

## 2021-02-09 NOTE — Progress Notes (Signed)
PROGRESS NOTE  Isabel Foley  DOB: 1970/06/17  PCP: Jinny Sanders, MD SWN:[id number]  DOA: 02/08/2021  LOS: 1 day  Hospital Day: 2  Chief Complaint  Patient presents with   Abdominal Pain   Back Pain   Brief narrative: Isabel Foley is a 51 y.o. 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 Patient presented to the ED on 02/08/2021 approximately 24 hours after sudden onset back pain that radiated around her side toward the epigastrium.  It started when she was trying to turn her torso while at an orthodontist office on 2/1. In the ED, patient was hemodynamically stable. 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 Urgent vascular surgery consultation was called. Start on heparin drip. Admitted to hospitalist service.  Subjective: Patient was seen and examined this morning.  Pleasant middle-aged Caucasian female.  Propped up in bed.  Not in distress.  Abdomen pain improved but back pain continues. Remains on heparin drip. Chart reviewed Hemodynamically stable last 24 hours Labs with no remarkable change except for improvement in WBC count from 17-12.  Assessment/Plan: Mesenteric artery dissection -Presented after 24 hours of sudden onset acute back pain radiating to epigastrium -CT angio finding as above showing dissection of the proximal superior mesenteric artery -Seen vascular surgery. -Currently on heparin drip.  Noted a plan to repeat CT angio and initiate aspirin and Plavix after 36 to 48 hours. -Not requiring surgical intervention at this time.  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  Because of her history of severe degenerative joint disease, MRI cervical and thoracic spine will be obtained to rule out acute  spinal process.  GERD, history of peptic ulcer disease -Continue PPI  Hyperlipidemia -Continue statin  Anxiety disorder -As needed Xanax  Mobility -Increase ambulation  Goals of care -  Code Status: Full Code   Nutritional status:  Body mass index is 23.23 kg/m.      Diet:  Diet Order             Diet regular Room service appropriate? Yes; Fluid consistency: Thin  Diet effective now                  DVT prophylaxis: Heparin drip   Antimicrobials: None Fluid: None Consultants: Vascular surgery Family Communication: None at bedside  Status is: Inpatient  Continue in-hospital care because: Remains on heparin drip pending MRI spine Level of care: Progressive   Dispo: The patient is from: Home              Anticipated d/c is to: Home when cleared by vascular surgery              Patient currently is not medically stable to d/c.   Difficult to place patient No     Infusions:   heparin 1,100 Units/hr (02/09/21 1202)    Scheduled Meds:  aspirin  81 mg Oral Daily   budesonide (PULMICORT) nebulizer solution  0.25 mg Nebulization BID    PRN meds: albuterol, ALPRAZolam, cyclobenzaprine, morphine injection, ondansetron **OR** ondansetron (ZOFRAN) IV, SUMAtriptan, topiramate   Antimicrobials: Anti-infectives (From admission, onward)    None       Objective: Vitals:   02/09/21 0754 02/09/21 1230  BP:  110/62  Pulse:  85  Resp:  16  Temp:  98.5 F (36.9 C)  SpO2: 98% 100%    Intake/Output Summary (Last 24 hours) at 02/09/2021 1344 Last data filed at 02/09/2021 1018 Gross per 24 hour  Intake 480 ml  Output --  Net 480 ml   Filed Weights   02/08/21 1032  Weight: 57.6 kg   Weight change:  Body mass index is 23.23 kg/m.   Physical Exam: General exam: Pleasant, middle-aged Caucasian female.  Not in physical distress at this time Skin: No rashes, lesions or ulcers. HEENT: Atraumatic, normocephalic, no obvious bleeding Lungs: Clear to  auscultation bilaterally CVS: Regular rate and rhythm, no murmur GI/Abd soft, mild tenderness, bowel sound present CNS: Alert, awake, oriented x3 Psychiatry: Mood appropriate Extremities: No pedal edema, no calf tenderness  Data Review: I have personally reviewed the laboratory data and studies available.  F/u labs ordered Unresulted Labs (From admission, onward)     Start     Ordered   02/09/21 1500  Heparin level (unfractionated)  Once-Timed,   TIMED        02/09/21 0093            Signed, Terrilee Croak, MD [facility] 02/09/2021

## 2021-02-09 NOTE — Progress Notes (Signed)
ANTICOAGULATION CONSULT NOTE  Pharmacy Consult for heparin infusion Indication: SMA dissection  No Known Allergies  Patient Measurements: Height: 5\' 2"  (157.5 cm) Weight: 57.6 kg (127 lb) IBW/kg (Calculated) : 50.1 Heparin Dosing Weight: 57.6 kg  Vital Signs: Temp: 98.4 F (36.9 C) (02/03 1512) BP: 104/68 (02/03 1512) Pulse Rate: 88 (02/03 1512)  Labs: Recent Labs    02/08/21 1031 02/08/21 1410 02/08/21 1953 02/09/21 0555 02/09/21 1444  HGB 12.7  --   --  11.4*  --   HCT 38.9  --   --  34.8*  --   PLT 295  --   --  245  --   APTT  --  28  --   --   --   LABPROT  --  13.6  --   --   --   INR  --  1.0  --   --   --   HEPARINUNFRC  --   --  0.48 0.29* 0.45  CREATININE 0.66  --   --  0.59  --      Estimated Creatinine Clearance: 66.5 mL/min (by C-G formula based on SCr of 0.59 mg/dL).   Medical History: Past Medical History:  Diagnosis Date   Abdominal pain, right upper quadrant    Allergic rhinitis, cause unspecified    Depressive disorder, not elsewhere classified    Dizziness and giddiness    Esophageal reflux    Nontoxic multinodular goiter    Other chronic sinusitis    Other malaise and fatigue    Pain in limb    Peptic ulcer, unspecified site, unspecified as acute or chronic, without mention of hemorrhage, perforation, or obstruction    Premenstrual tension syndromes    Pure hypercholesterolemia    Unspecified hypothyroidism     Medications:  Per chart review, no anticoagulation prior to admission  Assessment: 51 yo female complaining of upper back and abdominal discomfort. Pharmacy has been consulted for heparin dosing.   Baseline labs: H&H and Plt wnl; aPTT and PT-INR pending  Date Time HL Rate/Comment  2/02 1953 0.48 1000 units/hr/ therapeutic x1 2/03 0555 0.29 subtherapeutic  2/04 1444  0.45 thera   Goal of Therapy:  Heparin level 0.3-0.7 units/ml Monitor platelets by anticoagulation protocol: Yes   Plan:  2/04 1444   HL=0.45 thera Continue drip at 1100 units/hr  Check confirmatory anti-Xa level in 6 hours and daily while on heparin Continue to monitor H&H and platelets  Brittanee Ghazarian A 02/09/2021,3:58 PM

## 2021-02-09 NOTE — TOC CM/SW Note (Signed)
°  Transition of Care [facility]) Screening Note   Patient Details  Name: Isabel Foley Date of Birth: 1970/05/22   Transition of Care [facility]) CM/SW Contact:    Candie Chroman, LCSW Phone Number: 02/09/2021, 2:34 PM    Transition of Care Department [facility]) has reviewed patient and no TOC needs have been identified at this time. We will continue to monitor patient advancement through interdisciplinary progression rounds. If new patient transition needs arise, please place a TOC consult.

## 2021-02-09 NOTE — Progress Notes (Signed)
[facility]  Daily Progress Note   Subjective  -   Patient sitting up in bed appears comfortable upon entering the room.  She states that the pain is improved it is really now a dull ache in the center of her back at the level it first started.  She is tolerating her diet no abdominal pain no nausea  Objective Vitals:   02/09/21 0749 02/09/21 0754 02/09/21 1230 02/09/21 1512  BP: 108/72  110/62 104/68  Pulse: 88  85 88  Resp: 14  16 16   Temp: 98.2 F (36.8 C)  98.5 F (36.9 C) 98.4 F (36.9 C)  TempSrc:      SpO2: 99% 98% 100% 100%  Weight:      Height:        Intake/Output Summary (Last 24 hours) at 02/09/2021 1716 Last data filed at 02/09/2021 1400 Gross per 24 hour  Intake 480 ml  Output --  Net 480 ml    PULM  Normal effort , no use of accessory muscles CV  No JVD, RRR Abd      No distended, nontender   Laboratory CBC    Component Value Date/Time   WBC 11.9 (H) 02/09/2021 0555   HGB 11.4 (L) 02/09/2021 0555   HGB 13.5 08/05/2019 1359   HCT 34.8 (L) 02/09/2021 0555   HCT 38.7 08/05/2019 1359   PLT 245 02/09/2021 0555   PLT 314 08/05/2019 1359    BMET    Component Value Date/Time   NA 137 02/09/2021 0555   NA 138 08/05/2019 1401   K 3.9 02/09/2021 0555   CL 107 02/09/2021 0555   CO2 25 02/09/2021 0555   GLUCOSE 82 02/09/2021 0555   BUN 9 02/09/2021 0555   BUN 20 08/05/2019 1401   CREATININE 0.59 02/09/2021 0555   CALCIUM 8.2 (L) 02/09/2021 0555   GFRNONAA >60 02/09/2021 0555   GFRAA 98 08/05/2019 1401    Assessment/Planning:  Mesenteric artery dissection: Today the patient does not appear to have any symptoms that would be attributable to mesenteric ischemia.  She is no longer having abdominal pain she is tolerating a diet and her WBC has essentially returned to normal all other labs are normal.  I will order a follow-up CT angiogram and if this is unchanged we can transition from heparin drip to dual antiplatelet therapy.   She can then follow-up with me in the office in 7 to 10 days with a duplex ultrasound.    2.  DJD of the spine: The MRI has been ordered and we will coordinate pending its findings.    3.  GERD: Continue PPI as already ordered, this medication has been reviewed and there are no changes at this time.   Avoidence of caffeine and alcohol   Moderate elevation of the head of the bed     4.  Hyperlipidemia: Continue statin as ordered and reviewed, no changes at this time    Hortencia Pilar, MD   Hortencia Pilar  02/09/2021, 5:16 PM

## 2021-02-10 DIAGNOSIS — I773 Arterial fibromuscular dysplasia: Secondary | ICD-10-CM

## 2021-02-10 DIAGNOSIS — I722 Aneurysm of renal artery: Secondary | ICD-10-CM

## 2021-02-10 LAB — HEPARIN LEVEL (UNFRACTIONATED): Heparin Unfractionated: 0.38 IU/mL (ref 0.30–0.70)

## 2021-02-10 LAB — CBC
HCT: 34.4 % — ABNORMAL LOW (ref 36.0–46.0)
Hemoglobin: 11.2 g/dL — ABNORMAL LOW (ref 12.0–15.0)
MCH: 31.7 pg (ref 26.0–34.0)
MCHC: 32.6 g/dL (ref 30.0–36.0)
MCV: 97.5 fL (ref 80.0–100.0)
Platelets: 226 10*3/uL (ref 150–400)
RBC: 3.53 MIL/uL — ABNORMAL LOW (ref 3.87–5.11)
RDW: 13.2 % (ref 11.5–15.5)
WBC: 10.8 10*3/uL — ABNORMAL HIGH (ref 4.0–10.5)
nRBC: 0 % (ref 0.0–0.2)

## 2021-02-10 MED ORDER — ASPIRIN 81 MG PO CHEW: 81.0000 mg | CHEWABLE_TABLET | Freq: Every day | ORAL | 1 refills | Status: AC

## 2021-02-10 MED ORDER — CLOPIDOGREL BISULFATE 75 MG PO TABS: 75.0000 mg | ORAL_TABLET | Freq: Every day | ORAL | 1 refills | Status: DC

## 2021-02-10 NOTE — Progress Notes (Signed)
Subjective: Interval History: has complaints of mild back pain.  She has no abdominal pain or nausea.  She is tolerating her diet without difficulty.   Objective: Vital signs in last 24 hours: Temp:  [97.6 F (36.4 C)-98.5 F (36.9 C)] 97.7 F (36.5 C) (02/04 0405) Pulse Rate:  [73-91] 73 (02/04 0405) Resp:  [16-20] 16 (02/04 0405) BP: (97-119)/(61-76) 97/61 (02/04 0405) SpO2:  [97 %-100 %] 97 % (02/04 0405)  Intake/Output from previous day: 02/03 0701 - 02/04 0700 In: 899.9 [P.O.:480; I.V.:419.9] Out: -  Intake/Output this shift: No intake/output data recorded.  General appearance: alert and no distress GI: Abdomen is soft.  She has no guarding.  There is mild tenderness to deep palpation in the epigastrium without rebound.  Lab Results: Recent Labs    02/09/21 0555 02/10/21 0320  WBC 11.9* 10.8*  HGB 11.4* 11.2*  HCT 34.8* 34.4*  PLT 245 226   BMET Recent Labs    02/08/21 1031 02/09/21 0555  NA 135 137  K 3.6 3.9  CL 104 107  CO2 27 25  GLUCOSE 106* 82  BUN 14 9  CREATININE 0.66 0.59  CALCIUM 8.8* 8.2*    Studies/Results: MR CERVICAL SPINE WO CONTRAST  Result Date: 02/09/2021 CLINICAL DATA:  Upper and mid back pain EXAM: MRI CERVICAL AND THORACIC SPINE WITHOUT CONTRAST TECHNIQUE: Multiplanar and multiecho pulse sequences of the cervical spine, to include the craniocervical junction and cervicothoracic junction, and the thoracic spine, were obtained without intravenous contrast. COMPARISON:  No prior MRI of the cervical or thoracic spine, correlation is made with CT chest 02/08/2021 FINDINGS: MRI CERVICAL SPINE FINDINGS Alignment: Mild straightening of the normal cervical lordosis. No listhesis. Vertebrae: No fracture, evidence of discitis, or bone lesion. Cord: Normal signal and morphology. Posterior Fossa, vertebral arteries, paraspinal tissues: Negative. Disc levels: C2-C3: No significant disc bulge. Mild left facet arthropathy. No spinal canal stenosis or  neuroforaminal narrowing. C3-C4: No significant disc bulge. No spinal canal stenosis or neuroforaminal narrowing. C4-C5: No significant disc bulge. No spinal canal stenosis or neuroforaminal narrowing. C5-C6: No significant disc bulge. No spinal canal stenosis or neuroforaminal narrowing. C6-C7: No significant disc bulge. No spinal canal stenosis or neuroforaminal narrowing. C7-T1: No significant disc bulge. No spinal canal stenosis or neuroforaminal narrowing. MRI THORACIC SPINE FINDINGS Alignment: Mild S shaped curvature of the thoracolumbar spine, with mild dextrocurvature of the upper thoracic spine. No significant listhesis. Vertebrae: No fracture, evidence of discitis, or bone lesion. Cord:  Normal signal and morphology. Paraspinal and other soft tissues: Negative. Disc levels: No significant disc bulges. No spinal canal stenosis or neural foraminal narrowing. IMPRESSION: No spinal canal stenosis or neural foraminal narrowing in the cervical or thoracic spine. Electronically Signed   By: Merilyn Baba M.D.   On: 02/09/2021 22:00   MR THORACIC SPINE WO CONTRAST  Result Date: 02/09/2021 CLINICAL DATA:  Upper and mid back pain EXAM: MRI CERVICAL AND THORACIC SPINE WITHOUT CONTRAST TECHNIQUE: Multiplanar and multiecho pulse sequences of the cervical spine, to include the craniocervical junction and cervicothoracic junction, and the thoracic spine, were obtained without intravenous contrast. COMPARISON:  No prior MRI of the cervical or thoracic spine, correlation is made with CT chest 02/08/2021 FINDINGS: MRI CERVICAL SPINE FINDINGS Alignment: Mild straightening of the normal cervical lordosis. No listhesis. Vertebrae: No fracture, evidence of discitis, or bone lesion. Cord: Normal signal and morphology. Posterior Fossa, vertebral arteries, paraspinal tissues: Negative. Disc levels: C2-C3: No significant disc bulge. Mild left facet arthropathy. No spinal canal  stenosis or neuroforaminal narrowing. C3-C4: No  significant disc bulge. No spinal canal stenosis or neuroforaminal narrowing. C4-C5: No significant disc bulge. No spinal canal stenosis or neuroforaminal narrowing. C5-C6: No significant disc bulge. No spinal canal stenosis or neuroforaminal narrowing. C6-C7: No significant disc bulge. No spinal canal stenosis or neuroforaminal narrowing. C7-T1: No significant disc bulge. No spinal canal stenosis or neuroforaminal narrowing. MRI THORACIC SPINE FINDINGS Alignment: Mild S shaped curvature of the thoracolumbar spine, with mild dextrocurvature of the upper thoracic spine. No significant listhesis. Vertebrae: No fracture, evidence of discitis, or bone lesion. Cord:  Normal signal and morphology. Paraspinal and other soft tissues: Negative. Disc levels: No significant disc bulges. No spinal canal stenosis or neural foraminal narrowing. IMPRESSION: No spinal canal stenosis or neural foraminal narrowing in the cervical or thoracic spine. Electronically Signed   By: Merilyn Baba M.D.   On: 02/09/2021 22:00   MM 3D SCREEN BREAST BILATERAL  Result Date: 01/15/2021 CLINICAL DATA:  Screening. EXAM: DIGITAL SCREENING BILATERAL MAMMOGRAM WITH TOMOSYNTHESIS AND CAD TECHNIQUE: Bilateral screening digital craniocaudal and mediolateral oblique mammograms were obtained. Bilateral screening digital breast tomosynthesis was performed. The images were evaluated with computer-aided detection. COMPARISON:  Previous exam(s). ACR Breast Density Category d: The breast tissue is extremely dense, which lowers the sensitivity of mammography FINDINGS: There are no findings suspicious for malignancy. IMPRESSION: No mammographic evidence of malignancy. A result letter of this screening mammogram will be mailed directly to the patient. RECOMMENDATION: Screening mammogram in one year. (Code:[id number]-B-01Y) BI-RADS CATEGORY  1: Negative. Electronically Signed   By: Abelardo Diesel M.D.   On: 01/15/2021 13:10  CT Angio Chest/Abd/Pel for Dissection W  and/or Wo Contrast  Result Date: 02/08/2021 CLINICAL DATA:  Sudden onset upper back and upper abdominal pain with nausea since yesterday, aortic dissection suspected EXAM: CT ANGIOGRAPHY CHEST, ABDOMEN AND PELVIS TECHNIQUE: Non-contrast CT of the chest was initially obtained. Multidetector CT imaging through the chest, abdomen and pelvis was performed using the standard protocol during bolus administration of intravenous contrast. Multiplanar reconstructed images and MIPs were obtained and reviewed to evaluate the vascular anatomy. RADIATION DOSE REDUCTION: This exam was performed according to the departmental dose-optimization program which includes automated exposure control, adjustment of the mA and/or kV according to patient size and/or use of iterative reconstruction technique. CONTRAST:  164mL OMNIPAQUE IOHEXOL 350 MG/ML SOLN COMPARISON:  None. FINDINGS: CTA CHEST FINDINGS VASCULAR Aorta: Satisfactory opacification of the aorta. Normal contour and caliber of the thoracic aorta. No evidence of aneurysm, dissection, or other acute aortic pathology. Cardiovascular: No evidence of pulmonary embolism on limited non-tailored examination. Normal heart size. No pericardial effusion. Review of the MIP images confirms the above findings. NON VASCULAR Mediastinum/Nodes: No enlarged mediastinal, hilar, or axillary lymph nodes. Thyroid gland, trachea, and esophagus demonstrate no significant findings. Lungs/Pleura: 0.3 cm subpleural nodule of the dependent right lower lobe (series 7, image 76). No pleural effusion or pneumothorax. Musculoskeletal: No chest wall abnormality. No acute osseous findings. Review of the MIP images confirms the above findings. CTA ABDOMEN AND PELVIS FINDINGS VASCULAR Normal contour and caliber of the abdominal aorta. No evidence of aneurysm, aortic dissection, or other acute aortic pathology. Standard branching pattern of the abdominal aorta with solitary bilateral renal arteries. 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 The vessel is not overtly  aneurysmal, measuring no greater than 0.8 cm in caliber. The identifiable distal branches are opacified. Review of the MIP images confirms the above findings. NON-VASCULAR Hepatobiliary: Brightly enhancing lesion of the central liver dome, hepatic segment VIII, measuring 2.0 x 1.3 cm (series 5, image 73). Tiny gallstone in the dependent gallbladder. Gallbladder wall thickening, or biliary dilatation. Pancreas: Unremarkable. No pancreatic ductal dilatation or surrounding inflammatory changes. Spleen: Normal in size without significant abnormality. Adrenals/Urinary Tract: Adrenal glands are unremarkable. Kidneys are normal, without renal calculi, solid lesion, or hydronephrosis. Bladder is unremarkable. Stomach/Bowel: Stomach is within normal limits. Appendix appears normal. No evidence of bowel wall thickening, distention, or inflammatory changes. Lymphatic: No enlarged abdominal or pelvic lymph nodes. Reproductive: Uterine fibroids. Bilateral ovarian cysts, measuring up to 3.0 x 2.1 cm on the right (series 5, image 163). No follow-up imaging recommended. Note: This recommendation does not apply to premenarchal patients and to those with increased risk (genetic, family history, elevated tumor markers or other high-risk factors) of ovarian cancer. Reference: JACR 2020 Feb; 17(2):248-254 Other: No abdominal wall hernia or abnormality. No ascites. Musculoskeletal: No acute osseous findings. IMPRESSION: 1. No evidence of aortic aneurysm, aortic dissection, or other acute aortic pathology. 2. There is a dissection of the proximal superior mesenteric artery, approximately 2 cm from the vessel ostium, which extends into the dominant first order branch in the left  hemiabdomen and terminates. The vessel is not overtly aneurysmal, measuring no greater than 0.8 cm in caliber. Both the true and false lumen as well as the identifiable distal branches are opacified. 3. No evidence of bowel ischemia. 4. Brightly enhancing lesion of the central liver dome, hepatic segment VIII, measuring 2.0 x 1.3 cm. This may reflect a flash filling hemangioma or focal nodular hyperplasia, however is incompletely characterized. As hepatic adenoma and hepatocellular carcinoma are general differential considerations, recommend multiphasic contrast enhanced MRI to fully characterize on a nonemergent, outpatient basis. 5. Uterine fibroids. Electronically Signed   By: Delanna Ahmadi M.D.   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 Improving mesenteric ischemia. EXAM: CTA ABDOMEN AND PELVIS WITHOUT AND WITH CONTRAST TECHNIQUE: Multidetector CT imaging of the abdomen and pelvis was performed using the standard protocol during bolus administration of intravenous contrast. Multiplanar reconstructed images and MIPs were obtained and reviewed to evaluate the vascular anatomy. RADIATION DOSE REDUCTION: This exam was performed according to the departmental dose-optimization program which includes automated exposure control, adjustment of the mA and/or kV according to patient size and/or use of iterative reconstruction technique. CONTRAST:  143mL OMNIPAQUE IOHEXOL 350 MG/ML SOLN COMPARISON:  CT a abdomen/pelvis 02/08/2021 FINDINGS: VASCULAR Aorta: Normal caliber aorta without aneurysm, dissection, vasculitis or significant stenosis. Celiac: Patent without evidence of aneurysm, dissection, vasculitis or significant stenosis. SMA: Evolving spontaneous dissection of the superior mesenteric artery which is now visible beginning at the ostium and extending distally along approximately 2/3 of the length of  the artery. Replaced common hepatic artery. The artery remains mildly dilated but nonaneurysmal. No significant interval propagation of the dissection flap. No increased inflammation or wall thickening. Renals: Solitary renal arteries bilaterally. Focal beading of the mid and distal right renal artery consistent with fibromuscular dysplasia. Focal aneurysmal dilation of the posterior divisional branch of the right renal artery measuring 1.0 x 1.0 cm. No definitive evidence of FMD on the left. IMA: Patent without evidence of aneurysm, dissection, vasculitis or significant stenosis. Inflow: Subtle  beaded appearance of the bilateral external iliac arteries along the for several cm bilaterally. Findings are consistent with fibromuscular dysplasia. Proximal Outflow: Bilateral common femoral and visualized portions of the superficial and profunda femoral arteries are patent without evidence of aneurysm, dissection, vasculitis or significant stenosis. Veins: No focal venous abnormality. Review of the MIP images confirms the above findings. NON-VASCULAR Lower chest: No acute abnormality. Hepatobiliary: Similar appearance of vague arterially enhancing lesion in the central hepatic dome which is not element on venous phase imaging consistent with a benign hemangioma. Additional tiny subcentimeter low-attenuation lesions are also noted and while too small to characterize these are statistically highly likely benign cysts. Pancreas: Unremarkable. No pancreatic ductal dilatation or surrounding inflammatory changes. Spleen: Normal in size without focal abnormality. Adrenals/Urinary Tract: Adrenal glands are unremarkable. Kidneys are normal, without renal calculi, focal lesion, or hydronephrosis. Bladder is unremarkable. Stomach/Bowel: Stomach is within normal limits. No evidence of bowel wall thickening, distention, or inflammatory changes. Lymphatic: No suspicious lymphadenopathy. Reproductive: Large exophytic fibroid from the  left aspect of the mid to lower uterine segment measuring approximately 4.8 x 4.0 by 5.3 cm. Interval resolution of right ovarian cyst. Ovaries are otherwise unremarkable. Other: No abdominal wall hernia or abnormality. No abdominopelvic ascites. Musculoskeletal: No acute fracture or aggressive appearing lytic or blastic osseous lesion. IMPRESSION: VASCULAR 1. No significant interval change or propagation of spontaneous dissection of the superior mesenteric artery. The vessel remains mildly dilated at 9 mm in diameter. Given other findings described below, the source of the dissection is almost certainly related to underlying fibromuscular dysplasia. Recommend additional follow-up CT arteriography in 3 months. 2. Fibromuscular dysplasia affecting the mid and distal aspects of the right renal artery as well as the proximal external iliac arteries bilaterally. 3. 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 NON-VASCULAR 1. No focal bowel wall thickening, decreased perfusion or inflammatory changes to suggest mesenteric ischemia. 2. Large exophytic uterine fibroid. 3. Additional ancillary findings as above without interval change compared to recent prior imaging. Signed, Criselda Peaches, MD, [facility] Radiology Electronically Signed   By: Jacqulynn Cadet M.D.   On: 02/10/2021 06:46   Anti-infectives: Anti-infectives (From admission, onward)    None       Assessment/Plan:  Symptomatic superior mesenteric artery dissection.  Repeat CTA is reviewed on PACS demonstrating no change in the dissection of the SMA which appears to be a type I with perfusion of the true and false lumen without evidence of malperfusion or occlusion distally.  Other important findings include beaded appearance of the renal arteries and an associated small aneurysm on the posterior division branch of the right renal artery,  beaded appearance of the external iliac arteries bilaterally. 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  She has no evidence of any malperfusion and there has been no significant change in the appearance of the dissection.  Therefore transition to dual antiplatelet therapy with aspirin and Plavix and cessation of heparin therapy as planned by Dr. Delana Meyer appears appropriate.  She is satisfactory for discharge as long as she remains asymptomatic after discontinuing her heparin.  For completeness, she will need full FMD work-up including evaluation for cerebrovascular disease and possible coronary disease likely best performed with CTA of the head and neck followed by CTA of the chest.  These can be performed as an outpatient.  Patient will follow up with Dr. Delana Meyer within the week.  I discussed the need to monitor her symptoms and return for reevaluation should she develop any worsening of abdominal or back pain.    LOS: 2 days   Bertram Savin 02/10/2021, 10:19 AM

## 2021-02-10 NOTE — Progress Notes (Signed)
ANTICOAGULATION CONSULT NOTE - Follow up  Pharmacy Consult for heparin infusion Indication: SMA dissection  No Known Allergies  Patient Measurements: Height: 5\' 2"  (157.5 cm) Weight: 57.6 kg (127 lb) IBW/kg (Calculated) : 50.1 Heparin Dosing Weight: 57.6 kg  Vital Signs: Temp: 97.7 F (36.5 C) (02/04 0405) Temp Source: Oral (02/03 2308) BP: 97/61 (02/04 0405) Pulse Rate: 73 (02/04 0405)  Labs: Recent Labs    02/08/21 1031 02/08/21 1410 02/08/21 1953 02/09/21 0555 02/09/21 1444 02/09/21 2144 02/10/21 0320  HGB 12.7  --   --  11.4*  --   --  11.2*  HCT 38.9  --   --  34.8*  --   --  34.4*  PLT 295  --   --  245  --   --  226  APTT  --  28  --   --   --   --   --   LABPROT  --  13.6  --   --   --   --   --   INR  --  1.0  --   --   --   --   --   HEPARINUNFRC  --   --    < > 0.29* 0.45 0.32 0.38  CREATININE 0.66  --   --  0.59  --   --   --    < > = values in this interval not displayed.     Estimated Creatinine Clearance: 66.5 mL/min (by C-G formula based on SCr of 0.59 mg/dL).   Medical History: Past Medical History:  Diagnosis Date   Abdominal pain, right upper quadrant    Allergic rhinitis, cause unspecified    Depressive disorder, not elsewhere classified    Dizziness and giddiness    Esophageal reflux    Nontoxic multinodular goiter    Other chronic sinusitis    Other malaise and fatigue    Pain in limb    Peptic ulcer, unspecified site, unspecified as acute or chronic, without mention of hemorrhage, perforation, or obstruction    Premenstrual tension syndromes    Pure hypercholesterolemia    Unspecified hypothyroidism     Medications:  Per chart review, no anticoagulation prior to admission  Assessment: 51 yo female complaining of upper back and abdominal discomfort. Pharmacy has been consulted for heparin dosing.   Baseline labs: H&H and Plt wnl; aPTT and PT-INR pending  Date Time HL Rate/Comment  2/02 1953 0.48 1000 units/hr/ therapeutic  x1 2/03 0555 0.29 subtherapeutic  2/03 1444  0.45 thera 2/03 2100 0.32 Therapeutic x 2 2/03 0320 0.38 thera  Goal of Therapy:  Heparin level 0.3-0.7 units/ml Monitor platelets by anticoagulation protocol: Yes   Plan:  Continue drip at 1100 units/hr  Check next heparin level with AM labs Continue to monitor H&H and platelets  Chinita Greenland PharmD Clinical Pharmacist 02/10/2021

## 2021-02-10 NOTE — Discharge Summary (Signed)
Physician Discharge Summary  Isabel Foley ZOX:[id number] DOB: 1970-05-20 DOA: 02/08/2021  PCP: Jinny Sanders, MD  Admit date: 02/08/2021  Discharge date: 02/10/2021  Admitted From:Home  Disposition:  Home  Recommendations for Outpatient Follow-up:  Follow up with PCP in 1-2 weeks Follow-up with Dr. Franchot Gallo as scheduled in 7-10 days for repeat ultrasound Remain on aspirin and Plavix as prescribed Continue other home medications as prior  Home Health: None  Equipment/Devices: None  Discharge Condition:Stable  CODE STATUS: Full  Diet recommendation: Heart Healthy  Brief/Interim Summary: Isabel Foley is a 51 y.o. 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 Patient presented to the ED on 02/08/2021 approximately 24 hours after sudden onset back pain that radiated around her side toward the epigastrium.  It started when she was trying to turn her torso while at an orthodontist office on 2/1.  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  She was started on heparin drip.  She has been seen by vascular surgery and repeat angiogram was performed demonstrating stability.  She has no further symptoms of abdominal pain or back pain at this time and has also had MRI of her back with no significant findings.  She is currently in stable condition for discharge and will follow up with vascular surgery as noted above for repeat ultrasound and will remain on aspirin and Plavix as prescribed.  No other acute events noted during the course of this admission.  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  Discharge Instructions  Discharge Instructions     Diet - low sodium heart healthy   Complete by: As directed    Increase activity slowly   Complete by: As directed       Allergies as of 02/10/2021   No Known Allergies      Medication List     STOP taking these medications    azithromycin 250 MG tablet Commonly known as: ZITHROMAX   diclofenac 75 MG EC tablet Commonly known as: VOLTAREN   fluconazole 150 MG tablet Commonly known as: DIFLUCAN       TAKE these medications    albuterol 108 (90 Base) MCG/ACT inhaler Commonly known as: VENTOLIN HFA ProAir HFA 90 mcg/actuation aerosol inhaler  INHALE 2 PUFFS BY MOUTH EVERY 4-6 HOURS AS NEEDED FOR COUGH/WHEEZE   ALPRAZolam 0.5 MG tablet Commonly known as: XANAX Can use  1 tab daily  as needed for anxiety on long trips.   Arnuity Ellipta 100 MCG/ACT Aepb Generic drug: Fluticasone Furoate INHALE 1 PUFF BY MOUTH DAILY   aspirin 81 MG chewable tablet Chew 1 tablet (81 mg total) by mouth daily. Start taking on: February 11, 2021   azelastine 0.05 % ophthalmic solution Commonly known as: OPTIVAR Place 1 drop into both eyes daily as needed.   benzonatate 200 MG capsule Commonly known as: TESSALON Take 1 capsule (200 mg total) by mouth 3 (three) times daily as needed for cough.   cetirizine 10 MG tablet Commonly known as: ZYRTEC Take 10 mg by mouth daily.   clopidogrel 75 MG  tablet Commonly known as: Plavix Take 1 tablet (75 mg total) by mouth daily.   COLLAGEN PO Take 1 Dose by mouth daily.   cyclobenzaprine 10 MG tablet Commonly known as: FLEXERIL TAKE 1 TABLET BY MOUTH AT BEDTIME AS NEEDED FOR MUSCLE SPASMS   fexofenadine 180 MG tablet Commonly known as: ALLEGRA Take 180 mg by mouth daily as needed.   fluticasone 50 MCG/ACT nasal spray Commonly known as: FLONASE Place into  both nostrils daily.   ipratropium 0.06 % nasal spray Commonly known as: ATROVENT Place 2 sprays into the nose daily.   Jublia 10 % Soln Generic drug: Efinaconazole Apply QHS to affected toenails   mometasone 0.1 % cream Commonly known as: ELOCON Apply 1 application topically 2 (two) times daily. Prn flares   montelukast 10 MG tablet Commonly known as: SINGULAIR Take 10 mg by mouth at bedtime.   multivitamin tablet Take 1 tablet by mouth daily.   PROBIOTIC FORMULA PO Take 1 tablet by mouth daily.   rizatriptan 10 MG tablet Commonly known as: MAXALT TAKE 1 TABLET (10 MG TOTAL) BY MOUTH AS NEEDED FOR MIGRAINE. MAY REPEAT IN 2 HOURS IF NEEDED   topiramate 25 MG tablet Commonly known as: Topamax Take 1 tablet (25 mg total) by mouth at bedtime as needed.   tranexamic acid 650 MG Tabs tablet Commonly known as: LYSTEDA TAKE 2 TABLETS BY MOUTH 3 TIMES DAILY. TAKE DURING MENSES FOR MAXIMUM OF 5 DAYS   vitamin B-12 500 MCG tablet Commonly known as: CYANOCOBALAMIN Take 500 mcg by mouth daily. Unsure of dose she takes daily   Vitamin D3 10 MCG (400 UNIT) Caps Take 1 Dose by mouth daily. Unsure of dose she takes        Follow-up Information     Schnier, Dolores Lory, MD Follow up.   Specialties: Vascular Surgery, Cardiology, Radiology, Vascular Surgery Why: Please follow-up in 7 to 10 days with an ultrasound of the mesenteric artery.  Okay to double book if needed Contact information: [place] [address] [phone]         Jinny Sanders, MD. Schedule an appointment as soon as possible for a visit in 1 week(s).   Specialty: Family Medicine Contact information: [address] [place] [address] [phone]                No Known Allergies  Consultations: Vascular surgery   Procedures/Studies: MR CERVICAL SPINE WO CONTRAST  Result Date: 02/09/2021 CLINICAL DATA:  Upper and mid back pain EXAM: MRI CERVICAL AND THORACIC  SPINE WITHOUT CONTRAST TECHNIQUE: Multiplanar and multiecho pulse sequences of the cervical spine, to include the craniocervical junction and cervicothoracic junction, and the thoracic spine, were obtained without intravenous contrast. COMPARISON:  No prior MRI of the cervical or thoracic spine, correlation is made with CT chest 02/08/2021 FINDINGS: MRI CERVICAL SPINE FINDINGS Alignment: Mild straightening of the normal cervical lordosis. No listhesis. Vertebrae: No fracture, evidence of discitis, or bone lesion. Cord: Normal signal and morphology. Posterior Fossa, vertebral arteries, paraspinal tissues: Negative. Disc levels: C2-C3: No significant disc bulge. Mild left facet arthropathy. No spinal canal stenosis or neuroforaminal narrowing. C3-C4: No significant disc bulge. No spinal canal stenosis or neuroforaminal narrowing. C4-C5: No significant disc bulge. No spinal canal stenosis or neuroforaminal narrowing. C5-C6: No significant disc bulge. No spinal canal stenosis or neuroforaminal narrowing. C6-C7: No significant disc bulge. No spinal canal stenosis or neuroforaminal narrowing. C7-T1: No significant disc bulge. No spinal canal stenosis  or neuroforaminal narrowing. MRI THORACIC SPINE FINDINGS Alignment: Mild S shaped curvature of the thoracolumbar spine, with mild dextrocurvature of the upper thoracic spine. No significant listhesis. Vertebrae: No fracture, evidence of discitis, or bone lesion. Cord:  Normal signal and morphology. Paraspinal and other soft tissues: Negative. Disc levels: No significant disc bulges. No spinal canal stenosis or neural foraminal narrowing. IMPRESSION: No spinal canal stenosis or neural foraminal narrowing in the cervical or thoracic spine. Electronically Signed   By: Merilyn Baba M.D.   On: 02/09/2021 22:00   MR THORACIC SPINE WO CONTRAST  Result Date: 02/09/2021 CLINICAL DATA:  Upper and mid back pain EXAM: MRI CERVICAL AND THORACIC SPINE WITHOUT CONTRAST TECHNIQUE:  Multiplanar and multiecho pulse sequences of the cervical spine, to include the craniocervical junction and cervicothoracic junction, and the thoracic spine, were obtained without intravenous contrast. COMPARISON:  No prior MRI of the cervical or thoracic spine, correlation is made with CT chest 02/08/2021 FINDINGS: MRI CERVICAL SPINE FINDINGS Alignment: Mild straightening of the normal cervical lordosis. No listhesis. Vertebrae: No fracture, evidence of discitis, or bone lesion. Cord: Normal signal and morphology. Posterior Fossa, vertebral arteries, paraspinal tissues: Negative. Disc levels: C2-C3: No significant disc bulge. Mild left facet arthropathy. No spinal canal stenosis or neuroforaminal narrowing. C3-C4: No significant disc bulge. No spinal canal stenosis or neuroforaminal narrowing. C4-C5: No significant disc bulge. No spinal canal stenosis or neuroforaminal narrowing. C5-C6: No significant disc bulge. No spinal canal stenosis or neuroforaminal narrowing. C6-C7: No significant disc bulge. No spinal canal stenosis or neuroforaminal narrowing. C7-T1: No significant disc bulge. No spinal canal stenosis or neuroforaminal narrowing. MRI THORACIC SPINE FINDINGS Alignment: Mild S shaped curvature of the thoracolumbar spine, with mild dextrocurvature of the upper thoracic spine. No significant listhesis. Vertebrae: No fracture, evidence of discitis, or bone lesion. Cord:  Normal signal and morphology. Paraspinal and other soft tissues: Negative. Disc levels: No significant disc bulges. No spinal canal stenosis or neural foraminal narrowing. IMPRESSION: No spinal canal stenosis or neural foraminal narrowing in the cervical or thoracic spine. Electronically Signed   By: Merilyn Baba M.D.   On: 02/09/2021 22:00   MM 3D SCREEN BREAST BILATERAL  Result Date: 01/15/2021 CLINICAL DATA:  Screening. EXAM: DIGITAL SCREENING BILATERAL MAMMOGRAM WITH TOMOSYNTHESIS AND CAD TECHNIQUE: Bilateral screening digital  craniocaudal and mediolateral oblique mammograms were obtained. Bilateral screening digital breast tomosynthesis was performed. The images were evaluated with computer-aided detection. COMPARISON:  Previous exam(s). ACR Breast Density Category d: The breast tissue is extremely dense, which lowers the sensitivity of mammography FINDINGS: There are no findings suspicious for malignancy. IMPRESSION: No mammographic evidence of malignancy. A result letter of this screening mammogram will be mailed directly to the patient. RECOMMENDATION: Screening mammogram in one year. (Code:[id number]-01Y) BI-RADS CATEGORY  1: Negative. Electronically Signed   By: Abelardo Diesel M.D.   On: 01/15/2021 13:10  CT Angio Chest/Abd/Pel for Dissection W and/or Wo Contrast  Result Date: 02/08/2021 CLINICAL DATA:  Sudden onset upper back and upper abdominal pain with nausea since yesterday, aortic dissection suspected EXAM: CT ANGIOGRAPHY CHEST, ABDOMEN AND PELVIS TECHNIQUE: Non-contrast CT of the chest was initially obtained. Multidetector CT imaging through the chest, abdomen and pelvis was performed using the standard protocol during bolus administration of intravenous contrast. Multiplanar reconstructed images and MIPs were obtained and reviewed to evaluate the vascular anatomy. RADIATION DOSE REDUCTION: This exam was performed according to the departmental dose-optimization program which includes automated exposure control, adjustment of the mA and/or kV according to  patient size and/or use of iterative reconstruction technique. CONTRAST:  125mL OMNIPAQUE IOHEXOL 350 MG/ML SOLN COMPARISON:  None. FINDINGS: CTA CHEST FINDINGS VASCULAR Aorta: Satisfactory opacification of the aorta. Normal contour and caliber of the thoracic aorta. No evidence of aneurysm, dissection, or other acute aortic pathology. Cardiovascular: No evidence of pulmonary embolism on limited non-tailored examination. Normal heart size. No pericardial effusion. Review of the  MIP images confirms the above findings. NON VASCULAR Mediastinum/Nodes: No enlarged mediastinal, hilar, or axillary lymph nodes. Thyroid gland, trachea, and esophagus demonstrate no significant findings. Lungs/Pleura: 0.3 cm subpleural nodule of the dependent right lower lobe (series 7, image 76). No pleural effusion or pneumothorax. Musculoskeletal: No chest wall abnormality. No acute osseous findings. Review of the MIP images confirms the above findings. CTA ABDOMEN AND PELVIS FINDINGS VASCULAR Normal contour and caliber of the abdominal aorta. No evidence of aneurysm, aortic dissection, or other acute aortic pathology. Standard branching pattern of the abdominal aorta with solitary bilateral renal arteries. There is a dissection of the proximal superior mesenteric artery, approximately 2 cm from the vessel ostium, which extends into the dominant first order branch in the left hemiabdomen and terminates (series 5, image 92, series 9, image 132). The vessel is not overtly aneurysmal, measuring no greater than 0.8 cm in caliber. The identifiable distal branches are opacified. Review of the MIP images confirms the above findings. NON-VASCULAR Hepatobiliary: Brightly enhancing lesion of the central liver dome, hepatic segment VIII, measuring 2.0 x 1.3 cm (series 5, image 73). Tiny gallstone in the dependent gallbladder. Gallbladder wall thickening, or biliary dilatation. Pancreas: Unremarkable. No pancreatic ductal dilatation or surrounding inflammatory changes. Spleen: Normal in size without significant abnormality. Adrenals/Urinary Tract: Adrenal glands are unremarkable. Kidneys are normal, without renal calculi, solid lesion, or hydronephrosis. Bladder is unremarkable. Stomach/Bowel: Stomach is within normal limits. Appendix appears normal. No evidence of bowel wall thickening, distention, or inflammatory changes. Lymphatic: No enlarged abdominal or pelvic lymph nodes. Reproductive: Uterine fibroids. Bilateral  ovarian cysts, measuring up to 3.0 x 2.1 cm on the right (series 5, image 163). No follow-up imaging recommended. Note: This recommendation does not apply to premenarchal patients and to those with increased risk (genetic, family history, elevated tumor markers or other high-risk factors) of ovarian cancer. Reference: JACR 2020 Feb; 17(2):248-254 Other: No abdominal wall hernia or abnormality. No ascites. Musculoskeletal: No acute osseous findings. IMPRESSION: 1. No evidence of aortic aneurysm, aortic dissection, or other acute aortic pathology. 2. There is a dissection of the proximal superior mesenteric artery, approximately 2 cm from the vessel ostium, which extends into the dominant first order branch in the left hemiabdomen and terminates. The vessel is not overtly aneurysmal, measuring no greater than 0.8 cm in caliber. Both the true and false lumen as well as the identifiable distal branches are opacified. 3. No evidence of bowel ischemia. 4. Brightly enhancing lesion of the central liver dome, hepatic segment VIII, measuring 2.0 x 1.3 cm. This may reflect a flash filling hemangioma or focal nodular hyperplasia, however is incompletely characterized. As hepatic adenoma and hepatocellular carcinoma are general differential considerations, recommend multiphasic contrast enhanced MRI to fully characterize on a nonemergent, outpatient basis. 5. Uterine fibroids. Electronically Signed   By: Delanna Ahmadi M.D.   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 Improving mesenteric ischemia. EXAM: CTA ABDOMEN AND PELVIS WITHOUT AND WITH CONTRAST TECHNIQUE: Multidetector CT imaging of the abdomen  and pelvis was performed using the standard protocol during bolus administration of intravenous contrast. Multiplanar reconstructed images and MIPs were obtained and reviewed to evaluate the vascular anatomy.  RADIATION DOSE REDUCTION: This exam was performed according to the departmental dose-optimization program which includes automated exposure control, adjustment of the mA and/or kV according to patient size and/or use of iterative reconstruction technique. CONTRAST:  183mL OMNIPAQUE IOHEXOL 350 MG/ML SOLN COMPARISON:  CT a abdomen/pelvis 02/08/2021 FINDINGS: VASCULAR Aorta: Normal caliber aorta without aneurysm, dissection, vasculitis or significant stenosis. Celiac: Patent without evidence of aneurysm, dissection, vasculitis or significant stenosis. 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 Replaced common hepatic artery. The artery remains mildly dilated but nonaneurysmal. No significant interval propagation of the dissection flap. No increased inflammation or wall thickening. Renals: Solitary renal arteries bilaterally. Focal beading of the mid and distal right renal artery consistent with fibromuscular dysplasia. Focal aneurysmal dilation of the posterior divisional branch of the right renal artery measuring 1.0 x 1.0 cm. No definitive evidence of FMD on the left. IMA: Patent without evidence of aneurysm, dissection, vasculitis or significant stenosis. Inflow: Subtle beaded appearance of the bilateral external iliac arteries along the for several cm bilaterally. Findings are consistent with fibromuscular dysplasia. Proximal Outflow: Bilateral common femoral and visualized portions of the superficial and profunda femoral arteries are patent without evidence of aneurysm, dissection, vasculitis or significant stenosis. Veins: No focal venous abnormality. Review of the MIP images confirms the above findings. NON-VASCULAR Lower chest: No acute abnormality. Hepatobiliary: Similar appearance of vague arterially enhancing lesion in the central hepatic dome which is not  element on venous phase imaging consistent with a benign hemangioma. Additional tiny subcentimeter low-attenuation lesions are also noted and while too small to characterize these are statistically highly likely benign cysts. Pancreas: Unremarkable. No pancreatic ductal dilatation or surrounding inflammatory changes. Spleen: Normal in size without focal abnormality. Adrenals/Urinary Tract: Adrenal glands are unremarkable. Kidneys are normal, without renal calculi, focal lesion, or hydronephrosis. Bladder is unremarkable. Stomach/Bowel: Stomach is within normal limits. No evidence of bowel wall thickening, distention, or inflammatory changes. Lymphatic: No suspicious lymphadenopathy. Reproductive: Large exophytic fibroid from the left aspect of the mid to lower uterine segment measuring approximately 4.8 x 4.0 by 5.3 cm. Interval resolution of right ovarian cyst. Ovaries are otherwise unremarkable. Other: No abdominal wall hernia or abnormality. No abdominopelvic ascites. Musculoskeletal: No acute fracture or aggressive appearing lytic or blastic osseous lesion. IMPRESSION: VASCULAR 1. No significant interval change or propagation of spontaneous dissection of the superior mesenteric artery. The vessel remains mildly dilated at 9 mm in diameter. Given other findings described below, the source of the dissection is almost certainly related to underlying fibromuscular dysplasia. Recommend additional follow-up CT arteriography in 3 months. 2. Fibromuscular dysplasia affecting the mid and distal aspects of the right renal artery as well as the proximal external iliac arteries bilaterally. 3. 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 NON-VASCULAR 1. No focal bowel wall thickening, decreased perfusion or inflammatory changes to suggest mesenteric ischemia. 2. Large exophytic uterine fibroid. 3. Additional ancillary findings as above without interval change compared to  recent prior imaging. Signed, Criselda Peaches, MD, [facility] Radiology Electronically Signed   By: Jacqulynn Cadet M.D.   On: 02/10/2021 06:46     Discharge Exam:  Vitals:   02/09/21 2308 02/10/21 0405  BP: 116/71 97/61  Pulse: 82 73  Resp: 16 16  Temp: 98.2 F (36.8 C) 97.7 F (36.5 C)  SpO2: 99% 97%   Vitals:   02/09/21 1512 02/09/21 1947 02/09/21 2308 02/10/21 0405  BP: 104/68 119/76 116/71 97/61  Pulse: 88 91 82 73  Resp: 16 20 16 16   Temp: 98.4 F (36.9 C) 97.6 F (36.4 C) 98.2 F (36.8 C) 97.7 F (36.5 C)  TempSrc:   Oral   SpO2: 100% 100% 99% 97%  Weight:      Height:        General: Pt is alert, awake, not in acute distress Cardiovascular: RRR, S1/S2 +, no rubs, no gallops Respiratory: CTA bilaterally, no wheezing, no rhonchi Abdominal: Soft, NT, ND, bowel sounds + Extremities: no edema, no cyanosis    The results of significant diagnostics from this hospitalization (including imaging, microbiology, ancillary and laboratory) are listed below for reference.     Microbiology: Recent Results (from the past 240 hour(s))  Resp Panel by RT-PCR (Flu A&B, Covid) Nasopharyngeal Swab     Status: None   Collection Time: 02/08/21  2:10 PM   Specimen: Nasopharyngeal Swab; Nasopharyngeal(NP) swabs in vial transport medium  Result Value Ref Range Status   SARS Coronavirus 2 by RT PCR NEGATIVE NEGATIVE Final    Comment: (NOTE) SARS-CoV-2 target nucleic acids are NOT DETECTED.  The SARS-CoV-2 RNA is generally detectable in upper respiratory specimens during the acute phase of infection. The lowest concentration of SARS-CoV-2 viral copies this assay can detect is 138 copies/mL. A negative result does not preclude SARS-Cov-2 infection and should not be used as the sole basis for treatment or other patient management decisions. A negative result may occur with  improper specimen collection/handling, submission  of specimen other than nasopharyngeal swab, presence of viral mutation(s) within the areas targeted by this assay, and inadequate number of viral copies(<138 copies/mL). A negative result must be combined with clinical observations, patient history, and epidemiological information. The expected result is Negative.  Fact Sheet for Patients:  EntrepreneurPulse.com.au  Fact Sheet for Healthcare Providers:  IncredibleEmployment.be  This test is no t yet approved or cleared by the [place] FDA and  has been authorized for detection and/or diagnosis of SARS-CoV-2 by FDA under an Emergency Use Authorization (EUA). This EUA will remain  in effect (meaning this test can be used) for the duration of the COVID-19 declaration under Section 564(b)(1) of the Act, 21 U.S.C.section 360bbb-3(b)(1), unless the authorization is terminated  or revoked sooner.       Influenza A by PCR NEGATIVE NEGATIVE Final   Influenza B by PCR NEGATIVE NEGATIVE Final    Comment: (NOTE) The Xpert Xpress SARS-CoV-2/FLU/RSV plus assay is intended as an aid in the diagnosis of influenza from Nasopharyngeal swab specimens and should not be used as a sole basis for treatment. Nasal washings and aspirates are unacceptable for Xpert Xpress SARS-CoV-2/FLU/RSV testing.  Fact Sheet for Patients: EntrepreneurPulse.com.au  Fact Sheet for Healthcare Providers: IncredibleEmployment.be  This test is not yet approved or cleared by the [place] FDA and has been authorized for detection and/or diagnosis of SARS-CoV-2 by FDA under an Emergency Use Authorization (EUA). This EUA will remain in effect (meaning this test can be used) for the duration of the COVID-19 declaration under Section 564(b)(1) of the Act, 21 U.S.C. section 360bbb-3(b)(1), unless the authorization is terminated or revoked.  Performed at [facility], [address] [place], [place] [address]      Labs: BNP (last 3 results) No results for input(s): BNP in the last 8760 hours. Basic Metabolic Panel: Recent Labs  Lab 02/08/21 1031 02/09/21 0555  NA 135 137  K 3.6 3.9  CL 104 107  CO2 27 25  GLUCOSE 106* 82  BUN 14 9  CREATININE 0.66 0.59  CALCIUM 8.8* 8.2*   Liver Function Tests: Recent Labs  Lab 02/08/21 1031  AST 28  ALT 29  ALKPHOS 55  BILITOT 0.5  PROT 7.3  ALBUMIN 3.8   Recent Labs  Lab 02/08/21 1031  LIPASE 30   No results for input(s): AMMONIA in the last 168 hours. CBC: Recent Labs  Lab 02/08/21 1031 02/09/21 0555 02/10/21 0320  WBC 17.2* 11.9* 10.8*  HGB 12.7 11.4* 11.2*  HCT 38.9 34.8* 34.4*  MCV 95.6 95.6 97.5  PLT 295 245 226   Cardiac Enzymes: No results for input(s): CKTOTAL, CKMB, CKMBINDEX, TROPONINI in the last 168 hours. BNP: Invalid input(s): POCBNP CBG: No results for input(s): GLUCAP in the last 168 hours. D-Dimer No results for input(s): DDIMER in the last 72 hours. Hgb A1c No results for input(s): HGBA1C in the last 72 hours. Lipid Profile No results for input(s): CHOL, HDL, LDLCALC, TRIG, CHOLHDL, LDLDIRECT in the last 72 hours. Thyroid function studies No results for input(s): TSH, T4TOTAL, T3FREE, THYROIDAB in the last 72 hours.  Invalid input(s): FREET3 Anemia work up No results for input(s): VITAMINB12, FOLATE, FERRITIN, TIBC, IRON, RETICCTPCT in the last 72 hours. Urinalysis    Component Value Date/Time   COLORURINE YELLOW 02/08/2021 1031   APPEARANCEUR CLEAR 02/08/2021 1031   LABSPEC 1.010 02/08/2021 1031   PHURINE 7.0 02/08/2021 1031   GLUCOSEU NEGATIVE 02/08/2021 1031   HGBUR NEGATIVE 02/08/2021 1031   BILIRUBINUR NEGATIVE 02/08/2021 1031   BILIRUBINUR neg 10/16/2016 1619   KETONESUR NEGATIVE 02/08/2021 1031   PROTEINUR NEGATIVE 02/08/2021 1031   UROBILINOGEN 0.2 10/16/2016 1619   NITRITE NEGATIVE 02/08/2021 1031   LEUKOCYTESUR NEGATIVE 02/08/2021 1031   Sepsis  Labs Invalid input(s): PROCALCITONIN,  WBC,  LACTICIDVEN Microbiology Recent Results (from the past 240 hour(s))  Resp Panel by RT-PCR (Flu A&B, Covid) Nasopharyngeal Swab     Status: None   Collection Time: 02/08/21  2:10 PM   Specimen: Nasopharyngeal Swab; Nasopharyngeal(NP) swabs in vial transport medium  Result Value Ref Range Status   SARS Coronavirus 2 by RT PCR NEGATIVE NEGATIVE Final    Comment: (NOTE) SARS-CoV-2 target nucleic acids are NOT DETECTED.  The SARS-CoV-2 RNA is generally detectable in upper respiratory specimens during the acute phase of infection. The lowest concentration of SARS-CoV-2 viral copies this assay can detect is 138 copies/mL. A negative result does not preclude SARS-Cov-2 infection and should not be used as the sole basis for treatment or other patient management decisions. A negative result may occur with  improper specimen collection/handling, submission of specimen other than nasopharyngeal swab, presence of viral mutation(s) within the areas targeted by this assay, and inadequate number of viral copies(<138 copies/mL). A negative result must be combined with clinical observations, patient history, and epidemiological information. The expected result is Negative.  Fact Sheet for Patients:  EntrepreneurPulse.com.au  Fact Sheet for Healthcare Providers:  IncredibleEmployment.be  This test is no t yet approved or cleared by the [place] FDA and  has been authorized for detection and/or diagnosis of SARS-CoV-2 by FDA under an Emergency Use Authorization (EUA). This EUA will remain  in effect (meaning  this test can be used) for the duration of the COVID-19 declaration under Section 564(b)(1) of the Act, 21 U.S.C.section 360bbb-3(b)(1), unless the authorization is terminated  or revoked sooner.       Influenza A by PCR NEGATIVE NEGATIVE Final   Influenza B by PCR NEGATIVE NEGATIVE Final    Comment:  (NOTE) The Xpert Xpress SARS-CoV-2/FLU/RSV plus assay is intended as an aid in the diagnosis of influenza from Nasopharyngeal swab specimens and should not be used as a sole basis for treatment. Nasal washings and aspirates are unacceptable for Xpert Xpress SARS-CoV-2/FLU/RSV testing.  Fact Sheet for Patients: EntrepreneurPulse.com.au  Fact Sheet for Healthcare Providers: IncredibleEmployment.be  This test is not yet approved or cleared by the [place] FDA and has been authorized for detection and/or diagnosis of SARS-CoV-2 by FDA under an Emergency Use Authorization (EUA). This EUA will remain in effect (meaning this test can be used) for the duration of the COVID-19 declaration under Section 564(b)(1) of the Act, 21 U.S.C. section 360bbb-3(b)(1), unless the authorization is terminated or revoked.  Performed at [facility], [address] [place], [place] [address]      Time coordinating discharge: 35 minutes  SIGNED:   Rodena Goldmann, DO [facility] 02/10/2021, 10:52 AM  If 7PM-7AM, please contact night-coverage [email or web address]

## 2021-02-12 ENCOUNTER — Encounter (INDEPENDENT_AMBULATORY_CARE_PROVIDER_SITE_OTHER): Payer: Self-pay

## 2021-02-12 ENCOUNTER — Telehealth: Payer: Self-pay

## 2021-02-12 ENCOUNTER — Encounter: Payer: Self-pay | Admitting: Family Medicine

## 2021-02-12 NOTE — Telephone Encounter (Signed)
Transition Care Management Follow-up Telephone Call Date of discharge and from where: TCM DC [facility] 02-10-21 Dx: dissection of mesenteric artery  How have you been since you were released from the hospital? Isabel Foley  Any questions or concerns? No  Items Reviewed: Did the pt receive and understand the discharge instructions provided? Yes  Medications obtained and verified? Yes  Other? No  Any new allergies since your discharge? No  Dietary orders reviewed? Yes Do you have support at home? Yes   Home Care and Equipment/Supplies: Were home health services ordered? no If so, what is the name of the agency? na  Has the agency set up a time to come to the patient's home? not applicable Were any new equipment or medical supplies ordered?  No What is the name of the medical supply agency? na Were you able to get the supplies/equipment? no Do you have any questions related to the use of the equipment or supplies? No  Functional Questionnaire: (I = Independent and D = Dependent) ADLs: I  Bathing/Dressing- I  Meal Prep- I  Eating- I  Maintaining continence- I  Transferring/Ambulation- I  Managing Meds- I  Follow up appointments reviewed:  PCP Hospital f/u appt confirmed? Yes  Scheduled to see Dr Diona Browner on 02-20-21 @ 320pm. [facility] f/u appt confirmed? No  Scheduled to see Dr Owens Shark on 02-28-21 @ 845am. Are transportation arrangements needed? No  If their condition worsens, is the pt aware to call PCP or go to the Emergency Dept.? Yes Was the patient provided with contact information for the PCP's office or ED? Yes Was to pt encouraged to call back with questions or concerns? Yes

## 2021-02-13 ENCOUNTER — Telehealth: Payer: Self-pay | Admitting: Family Medicine

## 2021-02-13 DIAGNOSIS — E7800 Pure hypercholesterolemia, unspecified: Secondary | ICD-10-CM

## 2021-02-13 DIAGNOSIS — Z8639 Personal history of other endocrine, nutritional and metabolic disease: Secondary | ICD-10-CM

## 2021-02-13 NOTE — Telephone Encounter (Signed)
-----   Message from Ellamae Sia sent at 02/12/2021  2:46 PM EST ----- Regarding: Lab orders for Friday, 2.24.23 Patient is scheduled for CPX labs, please order future labs, Thanks , Terri   Wants Thyroid checked , also. Thanks

## 2021-02-15 NOTE — Telephone Encounter (Signed)
This message was sent 3 days ago.  We have already addressed this with the patient and she has an appointment to see Dr. Delana Meyer on 02/16

## 2021-02-16 ENCOUNTER — Encounter (INDEPENDENT_AMBULATORY_CARE_PROVIDER_SITE_OTHER): Payer: Self-pay | Admitting: Vascular Surgery

## 2021-02-20 ENCOUNTER — Encounter: Payer: Self-pay | Admitting: Family Medicine

## 2021-02-20 ENCOUNTER — Ambulatory Visit: Payer: 59 | Admitting: Family Medicine

## 2021-02-20 ENCOUNTER — Other Ambulatory Visit: Payer: Self-pay

## 2021-02-20 VITALS — BP 124/82 | HR 65 | Temp 97.9°F | Ht 63.0 in | Wt 126.2 lb

## 2021-02-20 DIAGNOSIS — I7779 Dissection of other artery: Secondary | ICD-10-CM

## 2021-02-20 MED ORDER — CYCLOBENZAPRINE HCL 10 MG PO TABS: ORAL_TABLET | ORAL | 0 refills | Status: DC

## 2021-02-20 NOTE — Progress Notes (Signed)
Patient ID: Isabel Foley, female    DOB: 10-16-1970, 51 y.o.   MRN: [id number]  This visit was conducted in person.  BP 124/82    Pulse 65    Temp 97.9 F (36.6 C) (Temporal)    Ht 5\' 3"  (1.6 m)    Wt 126 lb 4 oz (57.3 kg)    LMP 01/26/2021    SpO2 100%    BMI 22.36 kg/m   Wt Readings from Last 3 Encounters:  02/20/21 126 lb 4 oz (57.3 kg)  02/08/21 127 lb (57.6 kg)  03/21/20 129 lb (58.5 kg)    CC:  Chief Complaint  Patient presents with   Hospitalization Follow-up    Admitted on 02/08/21 at [facility], dx dissection of mesenteric artery; epigastric pain.     Subjective:   HPI: Isabel Foley is a 51 y.o. female presenting on 02/20/2021 for Hospitalization Follow-up (Admitted on 02/08/21 at [facility], dx dissection of mesenteric artery; epigastric pain. 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   Hospital Summary: reviewed and copied for information. Patient presented to the ED on 02/08/2021 approximately 24 hours after sudden onset back pain that radiated around her side toward the epigastrium.  It started when she was trying to turn her torso while at an orthodontist office on 2/1.  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  She was started on heparin drip.  She has been seen by vascular surgery and repeat angiogram was performed demonstrating stability.  She has no further symptoms of abdominal pain or back pain at this time and has also had MRI of her back with no significant findings.  She is currently in stable condition for discharge and will follow up with vascular surgery as noted above for repeat ultrasound and will remain on aspirin and Plavix as prescribed.    Has followed up scheduled for repeat US with Vascular Dr. Franchot Gallo.  She continued to have poor control of pain. She is having trouble sleeping at night.   Intermittent nausea, epigastric pain.  No chest pain, no SOB, no dizziness.  She is using tylenol for pain... this helps the pain... no current pain. She notes more pain with heavy food/meal.. trying to eat lighter.  BP Readings from Last 3 Encounters:  02/20/21 124/82  02/10/21 97/61  10/26/19 122/70     No known family history.       Relevant past medical, surgical, family and social history reviewed and updated as indicated. Interim medical history since our last visit reviewed. Allergies and medications reviewed and updated. Outpatient Medications Prior to Visit  Medication Sig Dispense Refill   albuterol (VENTOLIN HFA) 108 (90 Base) MCG/ACT inhaler ProAir HFA 90 mcg/actuation aerosol inhaler  INHALE 2 PUFFS BY MOUTH EVERY 4-6 HOURS AS NEEDED FOR COUGH/WHEEZE     ALPRAZolam (XANAX) 0.5 MG tablet Can use  1 tab daily  as needed for anxiety on long trips. 20 tablet 0   ARNUITY ELLIPTA 100 MCG/ACT AEPB INHALE 1 PUFF BY MOUTH DAILY  5   aspirin 81 MG chewable tablet Chew 1 tablet (81 mg total) by mouth daily. 30 tablet 1   azelastine (OPTIVAR) 0.05 % ophthalmic solution Place 1 drop into both eyes daily as needed.      cetirizine (ZYRTEC) 10 MG tablet Take 10 mg by mouth daily.     Cholecalciferol (VITAMIN  D3) 10 MCG (400 UNIT) CAPS Take 1 Dose by mouth daily. Unsure of dose she takes     clopidogrel (PLAVIX) 75 MG tablet Take 1 tablet (75 mg total) by mouth daily. 30 tablet 1   COLLAGEN PO Take 1 Dose by mouth daily.     cyclobenzaprine (FLEXERIL) 10 MG tablet TAKE 1 TABLET BY MOUTH AT BEDTIME AS NEEDED FOR MUSCLE SPASMS 20 tablet 0   fexofenadine (ALLEGRA) 180 MG tablet Take 180 mg by mouth daily as needed.   5   fluticasone (FLONASE) 50 MCG/ACT nasal spray Place into both nostrils daily.     ipratropium (ATROVENT) 0.06 % nasal spray Place 2 sprays into the nose daily.     mometasone (ELOCON) 0.1 % cream Apply 1 application topically 2 (two) times daily. Prn flares 45 g 1    montelukast (SINGULAIR) 10 MG tablet Take 10 mg by mouth at bedtime.     Multiple Vitamin (MULTIVITAMIN) tablet Take 1 tablet by mouth daily.     Probiotic Product (PROBIOTIC FORMULA PO) Take 1 tablet by mouth daily.     rizatriptan (MAXALT) 10 MG tablet TAKE 1 TABLET (10 MG TOTAL) BY MOUTH AS NEEDED FOR MIGRAINE. MAY REPEAT IN 2 HOURS IF NEEDED 10 tablet 0   topiramate (TOPAMAX) 25 MG tablet Take 1 tablet (25 mg total) by mouth at bedtime as needed. 30 tablet 3   tranexamic acid (LYSTEDA) 650 MG TABS tablet TAKE 2 TABLETS BY MOUTH 3 TIMES DAILY. TAKE DURING MENSES FOR MAXIMUM OF 5 DAYS 30 tablet 2   vitamin B-12 (CYANOCOBALAMIN) 500 MCG tablet Take 500 mcg by mouth daily. Unsure of dose she takes daily     benzonatate (TESSALON) 200 MG capsule Take 1 capsule (200 mg total) by mouth 3 (three) times daily as needed for cough. 30 capsule 0   No facility-administered medications prior to visit.     Per HPI unless specifically indicated in ROS section below Review of Systems  Constitutional:  Negative for fatigue and fever.  HENT:  Negative for ear pain.   Eyes:  Negative for pain.  Respiratory:  Negative for chest tightness and shortness of breath.   Cardiovascular:  Negative for chest pain, palpitations and leg swelling.  Gastrointestinal:  Positive for abdominal pain.  Genitourinary:  Negative for dysuria.  Objective:  BP 124/82    Pulse 65    Temp 97.9 F (36.6 C) (Temporal)    Ht 5\' 3"  (1.6 m)    Wt 126 lb 4 oz (57.3 kg)    LMP 01/26/2021    SpO2 100%    BMI 22.36 kg/m   Wt Readings from Last 3 Encounters:  02/20/21 126 lb 4 oz (57.3 kg)  02/08/21 127 lb (57.6 kg)  03/21/20 129 lb (58.5 kg)      Physical Exam Constitutional:      General: She is not in acute distress.    Appearance: Normal appearance. She is well-developed. She is not ill-appearing or toxic-appearing.  HENT:     Head: Normocephalic.     Right Ear: Hearing, tympanic membrane, ear canal and external ear normal.  Tympanic membrane is not erythematous, retracted or bulging.     Left Ear: Hearing, tympanic membrane, ear canal and external ear normal. Tympanic membrane is not erythematous, retracted or bulging.     Nose: No mucosal edema or rhinorrhea.     Right Sinus: No maxillary sinus tenderness or frontal sinus tenderness.     Left Sinus: No maxillary  sinus tenderness or frontal sinus tenderness.     Mouth/Throat:     Pharynx: Uvula midline.  Eyes:     General: Lids are normal. Lids are everted, no foreign bodies appreciated.     Conjunctiva/sclera: Conjunctivae normal.     Pupils: Pupils are equal, round, and reactive to light.  Neck:     Thyroid: No thyroid mass or thyromegaly.     Vascular: No carotid bruit.     Trachea: Trachea normal.  Cardiovascular:     Rate and Rhythm: Normal rate and regular rhythm.     Pulses: Normal pulses.     Heart sounds: Normal heart sounds, S1 normal and S2 normal. No murmur heard.   No friction rub. No gallop.  Pulmonary:     Effort: Pulmonary effort is normal. No tachypnea or respiratory distress.     Breath sounds: Normal breath sounds. No decreased breath sounds, wheezing, rhonchi or rales.  Abdominal:     General: Bowel sounds are normal.     Palpations: Abdomen is soft.     Tenderness: There is no abdominal tenderness.  Musculoskeletal:     Cervical back: Normal range of motion and neck supple.  Skin:    General: Skin is warm and dry.     Findings: No rash.  Neurological:     Mental Status: She is alert.  Psychiatric:        Mood and Affect: Mood is not anxious or depressed.        Speech: Speech normal.        Behavior: Behavior normal. Behavior is cooperative.        Thought Content: Thought content normal.        Judgment: Judgment normal.      Results for orders placed or performed during the hospital encounter of 02/08/21  Resp Panel by RT-PCR (Flu A&B, Covid) Nasopharyngeal Swab   Specimen: Nasopharyngeal Swab; Nasopharyngeal(NP) swabs  in vial transport medium  Result Value Ref Range   SARS Coronavirus 2 by RT PCR NEGATIVE NEGATIVE   Influenza A by PCR NEGATIVE NEGATIVE   Influenza B by PCR NEGATIVE NEGATIVE  Lipase, blood  Result Value Ref Range   Lipase 30 11 - 51 U/L  Comprehensive metabolic panel  Result Value Ref Range   Sodium 135 135 - 145 mmol/L   Potassium 3.6 3.5 - 5.1 mmol/L   Chloride 104 98 - 111 mmol/L   CO2 27 22 - 32 mmol/L   Glucose, Bld 106 (H) 70 - 99 mg/dL   BUN 14 6 - 20 mg/dL   Creatinine, Ser 0.66 0.44 - 1.00 mg/dL   Calcium 8.8 (L) 8.9 - 10.3 mg/dL   Total Protein 7.3 6.5 - 8.1 g/dL   Albumin 3.8 3.5 - 5.0 g/dL   AST 28 15 - 41 U/L   ALT 29 0 - 44 U/L   Alkaline Phosphatase 55 38 - 126 U/L   Total Bilirubin 0.5 0.3 - 1.2 mg/dL   GFR, Estimated >60 >60 mL/min   Anion gap 4 (L) 5 - 15  CBC  Result Value Ref Range   WBC 17.2 (H) 4.0 - 10.5 K/uL   RBC 4.07 3.87 - 5.11 MIL/uL   Hemoglobin 12.7 12.0 - 15.0 g/dL   HCT 38.9 36.0 - 46.0 %   MCV 95.6 80.0 - 100.0 fL   MCH 31.2 26.0 - 34.0 pg   MCHC 32.6 30.0 - 36.0 g/dL   RDW 13.2 11.5 - 15.5 %  Platelets 295 150 - 400 K/uL   nRBC 0.0 0.0 - 0.2 %  Urinalysis, Routine w reflex microscopic  Result Value Ref Range   Color, Urine YELLOW YELLOW   APPearance CLEAR CLEAR   Specific Gravity, Urine 1.010 1.005 - 1.030   pH 7.0 5.0 - 8.0   Glucose, UA NEGATIVE NEGATIVE mg/dL   Hgb urine dipstick NEGATIVE NEGATIVE   Bilirubin Urine NEGATIVE NEGATIVE   Ketones, ur NEGATIVE NEGATIVE mg/dL   Protein, ur NEGATIVE NEGATIVE mg/dL   Nitrite NEGATIVE NEGATIVE   Leukocytes,Ua NEGATIVE NEGATIVE  Pregnancy, urine  Result Value Ref Range   Preg Test, Ur NEGATIVE NEGATIVE  APTT  Result Value Ref Range   aPTT 28 24 - 36 seconds  Protime-INR  Result Value Ref Range   Prothrombin Time 13.6 11.4 - 15.2 seconds   INR 1.0 0.8 - 1.2  Heparin level (unfractionated)  Result Value Ref Range   Heparin Unfractionated 0.48 0.30 - 0.70 IU/mL  HIV Antibody  (routine testing w rflx)  Result Value Ref Range   HIV Screen 4th Generation wRfx Non Reactive Non Reactive  CBC  Result Value Ref Range   WBC 11.9 (H) 4.0 - 10.5 K/uL   RBC 3.64 (L) 3.87 - 5.11 MIL/uL   Hemoglobin 11.4 (L) 12.0 - 15.0 g/dL   HCT 34.8 (L) 36.0 - 46.0 %   MCV 95.6 80.0 - 100.0 fL   MCH 31.3 26.0 - 34.0 pg   MCHC 32.8 30.0 - 36.0 g/dL   RDW 13.4 11.5 - 15.5 %   Platelets 245 150 - 400 K/uL   nRBC 0.0 0.0 - 0.2 %  Basic metabolic panel  Result Value Ref Range   Sodium 137 135 - 145 mmol/L   Potassium 3.9 3.5 - 5.1 mmol/L   Chloride 107 98 - 111 mmol/L   CO2 25 22 - 32 mmol/L   Glucose, Bld 82 70 - 99 mg/dL   BUN 9 6 - 20 mg/dL   Creatinine, Ser 0.59 0.44 - 1.00 mg/dL   Calcium 8.2 (L) 8.9 - 10.3 mg/dL   GFR, Estimated >60 >60 mL/min   Anion gap 5 5 - 15  Heparin level (unfractionated)  Result Value Ref Range   Heparin Unfractionated 0.29 (L) 0.30 - 0.70 IU/mL  Heparin level (unfractionated)  Result Value Ref Range   Heparin Unfractionated 0.45 0.30 - 0.70 IU/mL  Heparin level (unfractionated)  Result Value Ref Range   Heparin Unfractionated 0.32 0.30 - 0.70 IU/mL  CBC  Result Value Ref Range   WBC 10.8 (H) 4.0 - 10.5 K/uL   RBC 3.53 (L) 3.87 - 5.11 MIL/uL   Hemoglobin 11.2 (L) 12.0 - 15.0 g/dL   HCT 34.4 (L) 36.0 - 46.0 %   MCV 97.5 80.0 - 100.0 fL   MCH 31.7 26.0 - 34.0 pg   MCHC 32.6 30.0 - 36.0 g/dL   RDW 13.2 11.5 - 15.5 %   Platelets 226 150 - 400 K/uL   nRBC 0.0 0.0 - 0.2 %  Heparin level (unfractionated)  Result Value Ref Range   Heparin Unfractionated 0.38 0.30 - 0.70 IU/mL    This visit occurred during the SARS-CoV-2 public health emergency.  Safety protocols were in place, including screening questions prior to the visit, additional usage of staff PPE, and extensive cleaning of exam room while observing appropriate contact time as indicated for disinfecting solutions.   COVID 19 screen:  No recent travel or known exposure to [facility] The  patient  denies respiratory symptoms of COVID 19 at this time. The importance of social distancing was discussed today.   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  Encouraged po intake, hydration. Use tylenol for pain. Continue aspirin and plavix. She will keep appt with vascular as planned for further recommendations.          Eliezer Lofts, MD

## 2021-02-20 NOTE — Patient Instructions (Addendum)
Use tylenol for pain. Continue aspirin and plavix. Keep appt with vascular doctor.

## 2021-02-22 ENCOUNTER — Other Ambulatory Visit (INDEPENDENT_AMBULATORY_CARE_PROVIDER_SITE_OTHER): Payer: 59

## 2021-02-22 ENCOUNTER — Ambulatory Visit (INDEPENDENT_AMBULATORY_CARE_PROVIDER_SITE_OTHER): Payer: 59 | Admitting: Vascular Surgery

## 2021-02-22 ENCOUNTER — Other Ambulatory Visit: Payer: Self-pay

## 2021-02-22 ENCOUNTER — Other Ambulatory Visit (INDEPENDENT_AMBULATORY_CARE_PROVIDER_SITE_OTHER): Payer: Self-pay | Admitting: Vascular Surgery

## 2021-02-22 ENCOUNTER — Telehealth (INDEPENDENT_AMBULATORY_CARE_PROVIDER_SITE_OTHER): Payer: Self-pay | Admitting: Vascular Surgery

## 2021-02-22 VITALS — BP 133/81 | HR 78 | Ht 62.0 in | Wt 125.0 lb

## 2021-02-22 DIAGNOSIS — I773 Arterial fibromuscular dysplasia: Secondary | ICD-10-CM | POA: Diagnosis not present

## 2021-02-22 DIAGNOSIS — I722 Aneurysm of renal artery: Secondary | ICD-10-CM

## 2021-02-22 DIAGNOSIS — E7800 Pure hypercholesterolemia, unspecified: Secondary | ICD-10-CM

## 2021-02-22 DIAGNOSIS — I7779 Dissection of other artery: Secondary | ICD-10-CM

## 2021-02-22 DIAGNOSIS — H1045 Other chronic allergic conjunctivitis: Secondary | ICD-10-CM | POA: Insufficient documentation

## 2021-02-22 DIAGNOSIS — R062 Wheezing: Secondary | ICD-10-CM | POA: Insufficient documentation

## 2021-02-22 NOTE — Progress Notes (Signed)
MRN : [id number]  Isabel Foley is a 51 y.o. ([birth year]) female who presents with chief complaint of check circlulation.  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  Since discharge she has had difficulty eating.  She is having abdominal pain now every time she eats and states she has been having just some crackers and soup.  The patient does not report bloody bowel movements or diarrhea.  No history of peptic ulcer disease.   No prior peripheral angiograms or vascular interventions.  The patient denies amaurosis fugax or recent TIA symptoms. There are no recent neurological changes noted. The patient denies claudication symptoms or rest pain symptoms. The patient denies history of DVT, PE or superficial thrombophlebitis. The patient denies recent episodes of angina    No outpatient medications have been marked as taking for the 02/22/21 encounter (Appointment) with Delana Meyer, Dolores Lory, MD.    Past Medical History:  Diagnosis Date   Abdominal pain, right upper quadrant    Allergic rhinitis, cause unspecified    Depressive disorder, not elsewhere classified    Dizziness and giddiness    Esophageal reflux    Nontoxic multinodular goiter    Other chronic sinusitis    Other malaise and fatigue    Pain in limb    Peptic ulcer, unspecified site, unspecified as acute or chronic, without mention of hemorrhage, perforation, or obstruction    Premenstrual tension syndromes    Pure hypercholesterolemia    Unspecified hypothyroidism     Past Surgical History:  Procedure Laterality Date   BREAST BIOPSY Right 06/01/2015   stereo, path pending   DILATION AND CURETTAGE OF UTERUS  2002   LEEP  2000    Social History Social History   Tobacco Use   Smoking status: Never   Smokeless tobacco: Never  Substance Use Topics   Alcohol use: No   Drug use: No    Family  History Family History  Problem Relation Age of Onset   Hypertension Father    Hypothyroidism Father    Atrial fibrillation Brother 71   Other Sister        Collagen Vascular Disease   Hyperthyroidism Mother    Breast cancer Cousin     No Known Allergies   REVIEW OF SYSTEMS (Negative unless checked)  Constitutional: [] Weight loss  [] Fever  [] Chills Cardiac: [] Chest pain   [] Chest pressure   [] Palpitations   [] Shortness of breath when laying flat   [] Shortness of breath with exertion. Vascular:  [] Pain in legs with walking   [] Pain in legs at rest  [] History of DVT   [] Phlebitis   [] Swelling in legs   [] Varicose veins   [] Non-healing ulcers Pulmonary:   [] Uses home oxygen   [] Productive cough   [] Hemoptysis   [] Wheeze  [] COPD   [] Asthma Neurologic:  [] Dizziness   [] Seizures   [] History of stroke   [] History of TIA  [] Aphasia   [] Vissual changes   [] Weakness or numbness in arm   [] Weakness or numbness in leg Musculoskeletal:   [] Joint swelling   [] Joint pain   [] Low back pain Hematologic:  [] Easy bruising  [] Easy bleeding   [] Hypercoagulable state   [] Anemic Gastrointestinal:  [] Diarrhea   [] Vomiting  [] Gastroesophageal reflux/heartburn   [] Difficulty swallowing. Genitourinary:  [] Chronic kidney disease   [] Difficult urination  [] Frequent urination   [] Blood in urine Skin:  [] Rashes   [] Ulcers  Psychological:  [] History  of anxiety   []  History of major depression.  Physical Examination  There were no vitals filed for this visit. There is no height or weight on file to calculate BMI. Gen: WD/WN, NAD Head: [place]/AT, No temporalis wasting.  Ear/Nose/Throat: Hearing grossly intact, nares w/o erythema or drainage Eyes: PER, EOMI, sclera nonicteric.  Neck: Supple, no masses.  No bruit or JVD.  Pulmonary:  Good air movement, no audible wheezing, no use of accessory muscles.  Cardiac: RRR, normal S1, S2, no Murmurs. Vascular:   Vessel Right Left  Radial Palpable Palpable  Carotid  Palpable Palpable  Gastrointestinal: soft, non-distended. No guarding/no peritoneal signs.  Musculoskeletal: M/S 5/5 throughout.  No visible deformity.  Neurologic: CN 2-12 intact. Pain and light touch intact in extremities.  Symmetrical.  Speech is fluent. Motor exam as listed above. Psychiatric: Judgment intact, Mood & affect appropriate for pt's clinical situation. Dermatologic: No rashes or ulcers noted.  No changes consistent with cellulitis.   CBC Lab Results  Component Value Date   WBC 10.8 (H) 02/10/2021   HGB 11.2 (L) 02/10/2021   HCT 34.4 (L) 02/10/2021   MCV 97.5 02/10/2021   PLT 226 02/10/2021    BMET    Component Value Date/Time   NA 137 02/09/2021 0555   NA 138 08/05/2019 1401   K 3.9 02/09/2021 0555   CL 107 02/09/2021 0555   CO2 25 02/09/2021 0555   GLUCOSE 82 02/09/2021 0555   BUN 9 02/09/2021 0555   BUN 20 08/05/2019 1401   CREATININE 0.59 02/09/2021 0555   CALCIUM 8.2 (L) 02/09/2021 0555   GFRNONAA >60 02/09/2021 0555   GFRAA 98 08/05/2019 1401   Estimated Creatinine Clearance: 69.6 mL/min (by C-G formula based on SCr of 0.59 mg/dL).  COAG Lab Results  Component Value Date   INR 1.0 02/08/2021    Radiology MR CERVICAL SPINE WO CONTRAST  Result Date: 02/09/2021 CLINICAL DATA:  Upper and mid back pain EXAM: MRI CERVICAL AND THORACIC SPINE WITHOUT CONTRAST TECHNIQUE: Multiplanar and multiecho pulse sequences of the cervical spine, to include the craniocervical junction and cervicothoracic junction, and the thoracic spine, were obtained without intravenous contrast. COMPARISON:  No prior MRI of the cervical or thoracic spine, correlation is made with CT chest 02/08/2021 FINDINGS: MRI CERVICAL SPINE FINDINGS Alignment: Mild straightening of the normal cervical lordosis. No listhesis. Vertebrae: No fracture, evidence of discitis, or bone lesion. Cord: Normal signal and morphology. Posterior Fossa, vertebral arteries, paraspinal tissues: Negative. Disc levels:  C2-C3: No significant disc bulge. Mild left facet arthropathy. No spinal canal stenosis or neuroforaminal narrowing. C3-C4: No significant disc bulge. No spinal canal stenosis or neuroforaminal narrowing. C4-C5: No significant disc bulge. No spinal canal stenosis or neuroforaminal narrowing. C5-C6: No significant disc bulge. No spinal canal stenosis or neuroforaminal narrowing. C6-C7: No significant disc bulge. No spinal canal stenosis or neuroforaminal narrowing. C7-T1: No significant disc bulge. No spinal canal stenosis or neuroforaminal narrowing. MRI THORACIC SPINE FINDINGS Alignment: Mild S shaped curvature of the thoracolumbar spine, with mild dextrocurvature of the upper thoracic spine. No significant listhesis. Vertebrae: No fracture, evidence of discitis, or bone lesion. Cord:  Normal signal and morphology. Paraspinal and other soft tissues: Negative. Disc levels: No significant disc bulges. No spinal canal stenosis or neural foraminal narrowing. IMPRESSION: No spinal canal stenosis or neural foraminal narrowing in the cervical or thoracic spine. Electronically Signed   By: Merilyn Baba M.D.   On: 02/09/2021 22:00   MR THORACIC SPINE WO CONTRAST  Result  Date: 02/09/2021 CLINICAL DATA:  Upper and mid back pain EXAM: MRI CERVICAL AND THORACIC SPINE WITHOUT CONTRAST TECHNIQUE: Multiplanar and multiecho pulse sequences of the cervical spine, to include the craniocervical junction and cervicothoracic junction, and the thoracic spine, were obtained without intravenous contrast. COMPARISON:  No prior MRI of the cervical or thoracic spine, correlation is made with CT chest 02/08/2021 FINDINGS: MRI CERVICAL SPINE FINDINGS Alignment: Mild straightening of the normal cervical lordosis. No listhesis. Vertebrae: No fracture, evidence of discitis, or bone lesion. Cord: Normal signal and morphology. Posterior Fossa, vertebral arteries, paraspinal tissues: Negative. Disc levels: C2-C3: No significant disc bulge. Mild  left facet arthropathy. No spinal canal stenosis or neuroforaminal narrowing. C3-C4: No significant disc bulge. No spinal canal stenosis or neuroforaminal narrowing. C4-C5: No significant disc bulge. No spinal canal stenosis or neuroforaminal narrowing. C5-C6: No significant disc bulge. No spinal canal stenosis or neuroforaminal narrowing. C6-C7: No significant disc bulge. No spinal canal stenosis or neuroforaminal narrowing. C7-T1: No significant disc bulge. No spinal canal stenosis or neuroforaminal narrowing. MRI THORACIC SPINE FINDINGS Alignment: Mild S shaped curvature of the thoracolumbar spine, with mild dextrocurvature of the upper thoracic spine. No significant listhesis. Vertebrae: No fracture, evidence of discitis, or bone lesion. Cord:  Normal signal and morphology. Paraspinal and other soft tissues: Negative. Disc levels: No significant disc bulges. No spinal canal stenosis or neural foraminal narrowing. IMPRESSION: No spinal canal stenosis or neural foraminal narrowing in the cervical or thoracic spine. Electronically Signed   By: Merilyn Baba M.D.   On: 02/09/2021 22:00   CT Angio Chest/Abd/Pel for Dissection W and/or Wo Contrast  Result Date: 02/08/2021 CLINICAL DATA:  Sudden onset upper back and upper abdominal pain with nausea since yesterday, aortic dissection suspected EXAM: CT ANGIOGRAPHY CHEST, ABDOMEN AND PELVIS TECHNIQUE: Non-contrast CT of the chest was initially obtained. Multidetector CT imaging through the chest, abdomen and pelvis was performed using the standard protocol during bolus administration of intravenous contrast. Multiplanar reconstructed images and MIPs were obtained and reviewed to evaluate the vascular anatomy. RADIATION DOSE REDUCTION: This exam was performed according to the departmental dose-optimization program which includes automated exposure control, adjustment of the mA and/or kV according to patient size and/or use of iterative reconstruction technique.  CONTRAST:  142mL OMNIPAQUE IOHEXOL 350 MG/ML SOLN COMPARISON:  None. FINDINGS: CTA CHEST FINDINGS VASCULAR Aorta: Satisfactory opacification of the aorta. Normal contour and caliber of the thoracic aorta. No evidence of aneurysm, dissection, or other acute aortic pathology. Cardiovascular: No evidence of pulmonary embolism on limited non-tailored examination. Normal heart size. No pericardial effusion. Review of the MIP images confirms the above findings. NON VASCULAR Mediastinum/Nodes: No enlarged mediastinal, hilar, or axillary lymph nodes. Thyroid gland, trachea, and esophagus demonstrate no significant findings. Lungs/Pleura: 0.3 cm subpleural nodule of the dependent right lower lobe (series 7, image 76). No pleural effusion or pneumothorax. Musculoskeletal: No chest wall abnormality. No acute osseous findings. Review of the MIP images confirms the above findings. CTA ABDOMEN AND PELVIS FINDINGS VASCULAR Normal contour and caliber of the abdominal aorta. No evidence of aneurysm, aortic dissection, or other acute aortic pathology. Standard branching pattern of the abdominal aorta with solitary bilateral renal arteries. There is a dissection of the proximal superior mesenteric artery, approximately 2 cm from the vessel ostium, which extends into the dominant first order branch in the left hemiabdomen and terminates (series 5, image 92, series 9, image 132). The vessel is not overtly aneurysmal, measuring no greater than 0.8 cm in caliber. The identifiable  distal branches are opacified. Review of the MIP images confirms the above findings. NON-VASCULAR Hepatobiliary: Brightly enhancing lesion of the central liver dome, hepatic segment VIII, measuring 2.0 x 1.3 cm (series 5, image 73). Tiny gallstone in the dependent gallbladder. Gallbladder wall thickening, or biliary dilatation. Pancreas: Unremarkable. No pancreatic ductal dilatation or surrounding inflammatory changes. Spleen: Normal in size without significant  abnormality. Adrenals/Urinary Tract: Adrenal glands are unremarkable. Kidneys are normal, without renal calculi, solid lesion, or hydronephrosis. Bladder is unremarkable. Stomach/Bowel: Stomach is within normal limits. Appendix appears normal. No evidence of bowel wall thickening, distention, or inflammatory changes. Lymphatic: No enlarged abdominal or pelvic lymph nodes. Reproductive: Uterine fibroids. Bilateral ovarian cysts, measuring up to 3.0 x 2.1 cm on the right (series 5, image 163). No follow-up imaging recommended. Note: This recommendation does not apply to premenarchal patients and to those with increased risk (genetic, family history, elevated tumor markers or other high-risk factors) of ovarian cancer. Reference: JACR 2020 Feb; 17(2):248-254 Other: No abdominal wall hernia or abnormality. No ascites. Musculoskeletal: No acute osseous findings. IMPRESSION: 1. No evidence of aortic aneurysm, aortic dissection, or other acute aortic pathology. 2. There is a dissection of the proximal superior mesenteric artery, approximately 2 cm from the vessel ostium, which extends into the dominant first order branch in the left hemiabdomen and terminates. The vessel is not overtly aneurysmal, measuring no greater than 0.8 cm in caliber. Both the true and false lumen as well as the identifiable distal branches are opacified. 3. No evidence of bowel ischemia. 4. Brightly enhancing lesion of the central liver dome, hepatic segment VIII, measuring 2.0 x 1.3 cm. This may reflect a flash filling hemangioma or focal nodular hyperplasia, however is incompletely characterized. As hepatic adenoma and hepatocellular carcinoma are general differential considerations, recommend multiphasic contrast enhanced MRI to fully characterize on a nonemergent, outpatient basis. 5. Uterine fibroids. Electronically Signed   By: Delanna Ahmadi M.D.   On: 02/08/2021 12:26   CT Angio Abd/Pel w/ and/or w/o  Result Date: 02/10/2021 CLINICAL  DATA:  History of aortic dissection, considering treatment change. Improving mesenteric ischemia. EXAM: CTA ABDOMEN AND PELVIS WITHOUT AND WITH CONTRAST TECHNIQUE: Multidetector CT imaging of the abdomen and pelvis was performed using the standard protocol during bolus administration of intravenous contrast. Multiplanar reconstructed images and MIPs were obtained and reviewed to evaluate the vascular anatomy. RADIATION DOSE REDUCTION: This exam was performed according to the departmental dose-optimization program which includes automated exposure control, adjustment of the mA and/or kV according to patient size and/or use of iterative reconstruction technique. CONTRAST:  176mL OMNIPAQUE IOHEXOL 350 MG/ML SOLN COMPARISON:  CT a abdomen/pelvis 02/08/2021 FINDINGS: VASCULAR Aorta: Normal caliber aorta without aneurysm, dissection, vasculitis or significant stenosis. Celiac: Patent without evidence of aneurysm, dissection, vasculitis or significant stenosis. 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 Replaced common hepatic artery. The artery remains mildly dilated but nonaneurysmal. No significant interval propagation of the dissection flap. No increased inflammation or wall thickening. Renals: Solitary renal arteries bilaterally. Focal beading of the mid and distal right renal artery consistent with fibromuscular dysplasia. Focal aneurysmal dilation of the posterior divisional branch of the right renal artery measuring 1.0 x 1.0 cm. No definitive evidence of FMD on the left. IMA: Patent without evidence of aneurysm, dissection, vasculitis or significant stenosis. Inflow: Subtle beaded appearance of the bilateral external iliac arteries along the for  several cm bilaterally. Findings are consistent with fibromuscular dysplasia. Proximal Outflow: Bilateral common femoral and  visualized portions of the superficial and profunda femoral arteries are patent without evidence of aneurysm, dissection, vasculitis or significant stenosis. Veins: No focal venous abnormality. Review of the MIP images confirms the above findings. NON-VASCULAR Lower chest: No acute abnormality. Hepatobiliary: Similar appearance of vague arterially enhancing lesion in the central hepatic dome which is not element on venous phase imaging consistent with a benign hemangioma. Additional tiny subcentimeter low-attenuation lesions are also noted and while too small to characterize these are statistically highly likely benign cysts. Pancreas: Unremarkable. No pancreatic ductal dilatation or surrounding inflammatory changes. Spleen: Normal in size without focal abnormality. Adrenals/Urinary Tract: Adrenal glands are unremarkable. Kidneys are normal, without renal calculi, focal lesion, or hydronephrosis. Bladder is unremarkable. Stomach/Bowel: Stomach is within normal limits. No evidence of bowel wall thickening, distention, or inflammatory changes. Lymphatic: No suspicious lymphadenopathy. Reproductive: Large exophytic fibroid from the left aspect of the mid to lower uterine segment measuring approximately 4.8 x 4.0 by 5.3 cm. Interval resolution of right ovarian cyst. Ovaries are otherwise unremarkable. Other: No abdominal wall hernia or abnormality. No abdominopelvic ascites. Musculoskeletal: No acute fracture or aggressive appearing lytic or blastic osseous lesion. IMPRESSION: VASCULAR 1. No significant interval change or propagation of spontaneous dissection of the superior mesenteric artery. The vessel remains mildly dilated at 9 mm in diameter. Given other findings described below, the source of the dissection is almost certainly related to underlying fibromuscular dysplasia. Recommend additional follow-up CT arteriography in 3 months. 2. Fibromuscular dysplasia affecting the mid and distal aspects of the right renal  artery as well as the proximal external iliac arteries bilaterally. 3. 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 NON-VASCULAR 1. No focal bowel wall thickening, decreased perfusion or inflammatory changes to suggest mesenteric ischemia. 2. Large exophytic uterine fibroid. 3. Additional ancillary findings as above without interval change compared to recent prior imaging. Signed, Criselda Peaches, MD, [facility] Radiology Electronically Signed   By: Jacqulynn Cadet M.D.   On: 02/10/2021 06:46     Assessment/Plan 1. Dissection of mesenteric artery (HCC) The patient is experiencing increased symptoms likely related to her mesenteric artery dissection.  I will refer her to [facility].     A total of 35 minutes was spent with this patient and greater than 50% was spent in counseling and coordination of care with the patient.  Discussion included the treatment options for vascular disease including indications for surgery and intervention.  Also discussed is the appropriate timing of treatment.  In addition medical therapy was discussed.   - Ambulatory referral to Vascular Surgery  2. 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 HYPERCHOLESTEROLEMIA Continue statin as ordered and reviewed, no changes at this time     Hortencia Pilar, MD  02/22/2021 8:24 AM

## 2021-02-22 NOTE — Telephone Encounter (Signed)
I left a message letting her know that I had talked with Dr. Adriana Mccallum and that he had requested she bring a DVD of her CT angiogram with her

## 2021-02-23 ENCOUNTER — Encounter (INDEPENDENT_AMBULATORY_CARE_PROVIDER_SITE_OTHER): Payer: Self-pay | Admitting: Vascular Surgery

## 2021-02-24 ENCOUNTER — Encounter (INDEPENDENT_AMBULATORY_CARE_PROVIDER_SITE_OTHER): Payer: Self-pay | Admitting: Vascular Surgery

## 2021-02-26 ENCOUNTER — Telehealth (INDEPENDENT_AMBULATORY_CARE_PROVIDER_SITE_OTHER): Payer: Self-pay | Admitting: Vascular Surgery

## 2021-02-26 NOTE — Telephone Encounter (Signed)
Pt. Called and stated Dr. Delana Meyer wants her to go to [facility] to see Dr. Adriana Mccallum but they are waiting on the demographic and insurance with the referring doctor's name, his NPI, contact name and number and a diagnosis with a supporting documentation.  Their fax number is [phone].  She states she picked up the CD's and the hospital sent them by power share.

## 2021-02-26 NOTE — Telephone Encounter (Signed)
Can we send the last office note with her face sheet and on the fax cover sheet have Dr. Nino Parsley name and NPI listed

## 2021-02-26 NOTE — Telephone Encounter (Signed)
Done, thank you!

## 2021-02-28 ENCOUNTER — Encounter (INDEPENDENT_AMBULATORY_CARE_PROVIDER_SITE_OTHER): Payer: Self-pay

## 2021-02-28 ENCOUNTER — Ambulatory Visit (INDEPENDENT_AMBULATORY_CARE_PROVIDER_SITE_OTHER): Payer: Self-pay | Admitting: Nurse Practitioner

## 2021-02-28 NOTE — Assessment & Plan Note (Signed)
Nontoxic but pain not resolving, continues to limit her eating.  Encouraged po intake, hydration. Use tylenol for pain. Continue aspirin and plavix. She will keep appt with vascular as planned for further recommendations.

## 2021-03-02 ENCOUNTER — Other Ambulatory Visit: Payer: 59

## 2021-03-09 ENCOUNTER — Other Ambulatory Visit: Payer: Self-pay

## 2021-03-09 ENCOUNTER — Ambulatory Visit (INDEPENDENT_AMBULATORY_CARE_PROVIDER_SITE_OTHER): Payer: 59 | Admitting: Family Medicine

## 2021-03-09 VITALS — BP 100/78 | HR 94 | Temp 98.3°F | Ht 61.5 in | Wt 120.2 lb

## 2021-03-09 DIAGNOSIS — Z0000 Encounter for general adult medical examination without abnormal findings: Secondary | ICD-10-CM

## 2021-03-09 DIAGNOSIS — E7800 Pure hypercholesterolemia, unspecified: Secondary | ICD-10-CM | POA: Diagnosis not present

## 2021-03-09 DIAGNOSIS — I773 Arterial fibromuscular dysplasia: Secondary | ICD-10-CM

## 2021-03-09 DIAGNOSIS — Z1159 Encounter for screening for other viral diseases: Secondary | ICD-10-CM

## 2021-03-09 DIAGNOSIS — I7779 Dissection of other artery: Secondary | ICD-10-CM

## 2021-03-09 DIAGNOSIS — Z1211 Encounter for screening for malignant neoplasm of colon: Secondary | ICD-10-CM | POA: Diagnosis not present

## 2021-03-09 DIAGNOSIS — Z8639 Personal history of other endocrine, nutritional and metabolic disease: Secondary | ICD-10-CM | POA: Diagnosis not present

## 2021-03-09 DIAGNOSIS — F411 Generalized anxiety disorder: Secondary | ICD-10-CM

## 2021-03-09 LAB — LIPID PANEL
Cholesterol: 140 mg/dL (ref 0–200)
HDL: 51.8 mg/dL (ref >39.00)
LDL Cholesterol: 70 mg/dL (ref 0–99)
NonHDL: 88.25
Total CHOL/HDL Ratio: 3
Triglycerides: 93 mg/dL (ref 0.0–149.0)
VLDL: 18.6 mg/dL (ref 0.0–40.0)

## 2021-03-09 LAB — T4, FREE: Free T4: 1.06 ng/dL (ref 0.60–1.60)

## 2021-03-09 LAB — T3, FREE: T3, Free: 3.6 pg/mL (ref 2.3–4.2)

## 2021-03-09 LAB — TSH: TSH: 1.72 u[IU]/mL (ref 0.35–5.50)

## 2021-03-09 NOTE — Assessment & Plan Note (Signed)
Chronic, moderate control ? ?Continue using alprazolam as needed. ?

## 2021-03-09 NOTE — Assessment & Plan Note (Signed)
Due for reevaluation 

## 2021-03-09 NOTE — Progress Notes (Signed)
Patient ID: Isabel Foley, female    DOB: November 07, 1970, 51 y.o.   MRN: [id number]  This visit was conducted in person.  BP 100/78    Pulse 94    Temp 98.3 F (36.8 C) (Temporal)    Ht 5' 1.5" (1.562 m)    Wt 120 lb 4 oz (54.5 kg)    SpO2 98%    BMI 22.35 kg/m    CC:  Chief Complaint  Patient presents with   Annual Exam    Subjective:   HPI: Isabel Foley is a 51 y.o. 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 followed by Vascular Dr. Janice Coffin. He has referred her for second opinion at tertiary center. On Eliquis, plavix anticoagulation.   Plan surgical repair Lavell Islam [id number] at [facility] Dr. Adriana Mccallum   She has continued to lose weight but is trying to drink ensure. Wt Readings from Last 3 Encounters:  03/09/21 120 lb 4 oz (54.5 kg)  02/22/21 125 lb (56.7 kg)  02/20/21 126 lb 4 oz (57.3 kg)  Body mass index is 22.35 kg/m.   GAD, PTSD: Moderate control.  Elevated Cholesterol:  Due for re-eval. Lab Results  Component Value Date   CHOL 195 08/05/2019   HDL 62 08/05/2019   LDLCALC 113 (H) 08/05/2019   TRIG 114 08/05/2019   CHOLHDL 3.1 08/05/2019   The 10-year ASCVD risk score (Arnett DK, et al., 2019) is: 0.6%   Values used to calculate the score:     Age: 26 years     Sex: Female     Is Non-Hispanic African American: No     Diabetic: No     Tobacco smoker: No     Systolic Blood Pressure: 175 mmHg     Is BP treated: No     HDL Cholesterol: 62 mg/dL     Total Cholesterol: 195 mg/dL Using medications without problems: Muscle aches:  Diet compliance: minimal given pain with dissection Exercise: none Other complaints:  Chronic migraines managed with topamax 25 mg daily preventative.     Multinodular goiter and family history of thyroid disease... due for re-eval.  Relevant past medical, surgical, family and social history reviewed and updated  as indicated. Interim medical history since our last visit reviewed. Allergies and medications reviewed and updated. Outpatient Medications Prior to Visit  Medication Sig Dispense Refill   albuterol (VENTOLIN HFA) 108 (90 Base) MCG/ACT inhaler ProAir HFA 90 mcg/actuation aerosol inhaler  INHALE 2 PUFFS BY MOUTH EVERY 4-6 HOURS AS NEEDED FOR COUGH/WHEEZE     ALPRAZolam (XANAX) 0.5 MG tablet Can use  1 tab daily  as needed for anxiety on long trips. 20 tablet 0   apixaban (ELIQUIS) 5 MG TABS tablet Take by mouth.     ARNUITY ELLIPTA 100 MCG/ACT AEPB INHALE 1 PUFF BY MOUTH DAILY  5   aspirin 81 MG chewable tablet Chew 1 tablet (81 mg total) by mouth daily. 30 tablet 1   azelastine (OPTIVAR) 0.05 % ophthalmic solution Place 1 drop into both eyes daily as needed.      cetirizine (ZYRTEC) 10 MG tablet Take 10 mg by mouth daily.     Cholecalciferol (VITAMIN D3) 10 MCG (400 UNIT) CAPS Take 1 Dose by mouth daily. Unsure of dose she takes     clopidogrel (PLAVIX) 75 MG tablet Take 1 tablet (75 mg total) by mouth daily. 30 tablet 1  COLLAGEN PO Take 1 Dose by mouth daily.     cyclobenzaprine (FLEXERIL) 10 MG tablet TAKE 1 TABLET BY MOUTH AT BEDTIME AS NEEDED FOR MUSCLE SPASMS 20 tablet 0   fexofenadine (ALLEGRA) 180 MG tablet Take 180 mg by mouth daily as needed.   5   fluticasone (FLONASE) 50 MCG/ACT nasal spray Place into both nostrils daily.     ipratropium (ATROVENT) 0.06 % nasal spray Place 2 sprays into the nose daily.     mometasone (ELOCON) 0.1 % cream Apply 1 application topically 2 (two) times daily. Prn flares 45 g 1   montelukast (SINGULAIR) 10 MG tablet Take 10 mg by mouth at bedtime.     Multiple Vitamin (MULTIVITAMIN) tablet Take 1 tablet by mouth daily.     Probiotic Product (PROBIOTIC FORMULA PO) Take 1 tablet by mouth daily.     rizatriptan (MAXALT) 10 MG tablet TAKE 1 TABLET (10 MG TOTAL) BY MOUTH AS NEEDED FOR MIGRAINE. MAY REPEAT IN 2 HOURS IF NEEDED 10 tablet 0   topiramate  (TOPAMAX) 25 MG tablet Take 1 tablet (25 mg total) by mouth at bedtime as needed. 30 tablet 3   tranexamic acid (LYSTEDA) 650 MG TABS tablet TAKE 2 TABLETS BY MOUTH 3 TIMES DAILY. TAKE DURING MENSES FOR MAXIMUM OF 5 DAYS 30 tablet 2   vitamin B-12 (CYANOCOBALAMIN) 500 MCG tablet Take 500 mcg by mouth daily. Unsure of dose she takes daily     No facility-administered medications prior to visit.     Per HPI unless specifically indicated in ROS section below Review of Systems  Constitutional:  Negative for fatigue and fever.  HENT:  Negative for ear pain.   Eyes:  Negative for pain.  Respiratory:  Negative for chest tightness and shortness of breath.   Cardiovascular:  Negative for chest pain, palpitations and leg swelling.  Gastrointestinal:  Negative for abdominal pain.  Genitourinary:  Negative for dysuria.  Objective:  BP 100/78    Pulse 94    Temp 98.3 F (36.8 C) (Temporal)    Ht 5' 1.5" (1.562 m)    Wt 120 lb 4 oz (54.5 kg)    SpO2 98%    BMI 22.35 kg/m   Wt Readings from Last 3 Encounters:  03/09/21 120 lb 4 oz (54.5 kg)  02/22/21 125 lb (56.7 kg)  02/20/21 126 lb 4 oz (57.3 kg)      Physical Exam Vitals and nursing note reviewed.  Constitutional:      General: She is not in acute distress.    Appearance: Normal appearance. She is well-developed. She is not ill-appearing or toxic-appearing.  HENT:     Head: Normocephalic.     Right Ear: Hearing, tympanic membrane, ear canal and external ear normal.     Left Ear: Hearing, tympanic membrane, ear canal and external ear normal.     Nose: Nose normal.  Eyes:     General: Lids are normal. Lids are everted, no foreign bodies appreciated.     Conjunctiva/sclera: Conjunctivae normal.     Pupils: Pupils are equal, round, and reactive to light.  Neck:     Thyroid: No thyroid mass or thyromegaly.     Vascular: No carotid bruit.     Trachea: Trachea normal.  Cardiovascular:     Rate and Rhythm: Normal rate and regular rhythm.      Heart sounds: Normal heart sounds, S1 normal and S2 normal. No murmur heard.   No gallop.  Pulmonary:  Effort: Pulmonary effort is normal. No respiratory distress.     Breath sounds: Normal breath sounds. No wheezing, rhonchi or rales.  Abdominal:     General: Bowel sounds are normal. There is no distension or abdominal bruit.     Palpations: Abdomen is soft. There is no fluid wave or mass.     Tenderness: There is generalized abdominal tenderness. There is no guarding or rebound.     Hernia: No hernia is present.  Musculoskeletal:     Cervical back: Normal range of motion and neck supple.  Lymphadenopathy:     Cervical: No cervical adenopathy.  Skin:    General: Skin is warm and dry.     Findings: No rash.  Neurological:     Mental Status: She is alert.     Cranial Nerves: No cranial nerve deficit.     Sensory: No sensory deficit.  Psychiatric:        Mood and Affect: Mood is not anxious or depressed.        Speech: Speech normal.        Behavior: Behavior normal. Behavior is cooperative.        Judgment: Judgment normal.      Results for orders placed or performed during the hospital encounter of 02/08/21  Resp Panel by RT-PCR (Flu A&B, Covid) Nasopharyngeal Swab   Specimen: Nasopharyngeal Swab; Nasopharyngeal(NP) swabs in vial transport medium  Result Value Ref Range   SARS Coronavirus 2 by RT PCR NEGATIVE NEGATIVE   Influenza A by PCR NEGATIVE NEGATIVE   Influenza B by PCR NEGATIVE NEGATIVE  Lipase, blood  Result Value Ref Range   Lipase 30 11 - 51 U/L  Comprehensive metabolic panel  Result Value Ref Range   Sodium 135 135 - 145 mmol/L   Potassium 3.6 3.5 - 5.1 mmol/L   Chloride 104 98 - 111 mmol/L   CO2 27 22 - 32 mmol/L   Glucose, Bld 106 (H) 70 - 99 mg/dL   BUN 14 6 - 20 mg/dL   Creatinine, Ser 0.66 0.44 - 1.00 mg/dL   Calcium 8.8 (L) 8.9 - 10.3 mg/dL   Total Protein 7.3 6.5 - 8.1 g/dL   Albumin 3.8 3.5 - 5.0 g/dL   AST 28 15 - 41 U/L   ALT 29 0 - 44 U/L    Alkaline Phosphatase 55 38 - 126 U/L   Total Bilirubin 0.5 0.3 - 1.2 mg/dL   GFR, Estimated >60 >60 mL/min   Anion gap 4 (L) 5 - 15  CBC  Result Value Ref Range   WBC 17.2 (H) 4.0 - 10.5 K/uL   RBC 4.07 3.87 - 5.11 MIL/uL   Hemoglobin 12.7 12.0 - 15.0 g/dL   HCT 38.9 36.0 - 46.0 %   MCV 95.6 80.0 - 100.0 fL   MCH 31.2 26.0 - 34.0 pg   MCHC 32.6 30.0 - 36.0 g/dL   RDW 13.2 11.5 - 15.5 %   Platelets 295 150 - 400 K/uL   nRBC 0.0 0.0 - 0.2 %  Urinalysis, Routine w reflex microscopic  Result Value Ref Range   Color, Urine YELLOW YELLOW   APPearance CLEAR CLEAR   Specific Gravity, Urine 1.010 1.005 - 1.030   pH 7.0 5.0 - 8.0   Glucose, UA NEGATIVE NEGATIVE mg/dL   Hgb urine dipstick NEGATIVE NEGATIVE   Bilirubin Urine NEGATIVE NEGATIVE   Ketones, ur NEGATIVE NEGATIVE mg/dL   Protein, ur NEGATIVE NEGATIVE mg/dL   Nitrite NEGATIVE NEGATIVE  Leukocytes,Ua NEGATIVE NEGATIVE  Pregnancy, urine  Result Value Ref Range   Preg Test, Ur NEGATIVE NEGATIVE  APTT  Result Value Ref Range   aPTT 28 24 - 36 seconds  Protime-INR  Result Value Ref Range   Prothrombin Time 13.6 11.4 - 15.2 seconds   INR 1.0 0.8 - 1.2  Heparin level (unfractionated)  Result Value Ref Range   Heparin Unfractionated 0.48 0.30 - 0.70 IU/mL  HIV Antibody (routine testing w rflx)  Result Value Ref Range   HIV Screen 4th Generation wRfx Non Reactive Non Reactive  CBC  Result Value Ref Range   WBC 11.9 (H) 4.0 - 10.5 K/uL   RBC 3.64 (L) 3.87 - 5.11 MIL/uL   Hemoglobin 11.4 (L) 12.0 - 15.0 g/dL   HCT 34.8 (L) 36.0 - 46.0 %   MCV 95.6 80.0 - 100.0 fL   MCH 31.3 26.0 - 34.0 pg   MCHC 32.8 30.0 - 36.0 g/dL   RDW 13.4 11.5 - 15.5 %   Platelets 245 150 - 400 K/uL   nRBC 0.0 0.0 - 0.2 %  Basic metabolic panel  Result Value Ref Range   Sodium 137 135 - 145 mmol/L   Potassium 3.9 3.5 - 5.1 mmol/L   Chloride 107 98 - 111 mmol/L   CO2 25 22 - 32 mmol/L   Glucose, Bld 82 70 - 99 mg/dL   BUN 9 6 - 20 mg/dL    Creatinine, Ser 0.59 0.44 - 1.00 mg/dL   Calcium 8.2 (L) 8.9 - 10.3 mg/dL   GFR, Estimated >60 >60 mL/min   Anion gap 5 5 - 15  Heparin level (unfractionated)  Result Value Ref Range   Heparin Unfractionated 0.29 (L) 0.30 - 0.70 IU/mL  Heparin level (unfractionated)  Result Value Ref Range   Heparin Unfractionated 0.45 0.30 - 0.70 IU/mL  Heparin level (unfractionated)  Result Value Ref Range   Heparin Unfractionated 0.32 0.30 - 0.70 IU/mL  CBC  Result Value Ref Range   WBC 10.8 (H) 4.0 - 10.5 K/uL   RBC 3.53 (L) 3.87 - 5.11 MIL/uL   Hemoglobin 11.2 (L) 12.0 - 15.0 g/dL   HCT 34.4 (L) 36.0 - 46.0 %   MCV 97.5 80.0 - 100.0 fL   MCH 31.7 26.0 - 34.0 pg   MCHC 32.6 30.0 - 36.0 g/dL   RDW 13.2 11.5 - 15.5 %   Platelets 226 150 - 400 K/uL   nRBC 0.0 0.0 - 0.2 %  Heparin level (unfractionated)  Result Value Ref Range   Heparin Unfractionated 0.38 0.30 - 0.70 IU/mL    This visit occurred during the SARS-CoV-2 public health emergency.  Safety protocols were in place, including screening questions prior to the visit, additional usage of staff PPE, and extensive cleaning of exam room while observing appropriate contact time as indicated for disinfecting solutions.   COVID 19 screen:  No recent travel or known exposure to COVID19 The patient denies respiratory symptoms of COVID 19 at this time. The importance of social distancing was discussed today.   Assessment and Plan The patient's preventative maintenance and recommended screening tests for an annual wellness exam were reviewed in full today. Brought up to date unless services declined.  Counselled on the importance of diet, exercise, and its role in overall health and mortality. The patient's FH and SH was reviewed, including their home life, tobacco status, and drug and alcohol status.   Vaccines: Consider shingrix. Pap/DVE: 08/05/19 Mammo: 01/2021 Colon:  due,  family history precancerous polyps. Smoking Status: none ETOH/  drug use: none/none  Hep C:  due  HIV screen:  done  Problem List Items Addressed This Visit     Dissection of mesenteric artery [facility])    Now followed by vascular at [facility] Dr. Adriana Mccallum. Plan stent versus surgical repair in April 2023.      Relevant Medications   apixaban (ELIQUIS) 5 MG TABS tablet   Fibromuscular dysplasia of right renal artery [facility])    She has appointment in [place] with specialist.      Relevant Medications   apixaban (ELIQUIS) 5 MG TABS tablet   GAD (generalized anxiety disorder)    Chronic, moderate control  Continue using alprazolam as needed.      History of hypothyroidism   HYPERCHOLESTEROLEMIA    Due for re-evaluation.       Relevant Medications   apixaban (ELIQUIS) 5 MG TABS tablet   Other Visit Diagnoses     Colon cancer screening    -  Primary   Relevant Orders   Fecal occult blood, imunochemical   Need for hepatitis C screening test       Relevant Orders   Hepatitis C antibody          Eliezer Lofts, MD

## 2021-03-09 NOTE — Patient Instructions (Addendum)
Please stop at the lab to have labs drawn. ? Pick up stool test in labs. ?

## 2021-03-09 NOTE — Assessment & Plan Note (Signed)
She has appointment in [place] with specialist. ?

## 2021-03-09 NOTE — Assessment & Plan Note (Signed)
Now followed by vascular at [facility] Dr. Adriana Mccallum. ?Plan stent versus surgical repair in April 2023. ?

## 2021-03-13 ENCOUNTER — Encounter: Payer: Self-pay | Admitting: Family Medicine

## 2021-03-16 ENCOUNTER — Telehealth: Payer: Self-pay | Admitting: Family Medicine

## 2021-03-16 NOTE — Telephone Encounter (Signed)
I spoke with pt; pt said on 03/15/21 she had pain most of the day.BP 145/110.pt said she has a dissected artery and even liquids can cause her to hurt. Now BP 118/81 P 88. Pt has limited schedule to be seen today or Monday. Pt wants to know if she has to make an appt. I spoke with Dr Diona Browner and she said to advise pt to follow BP at home and contact [facility] early next wk with BP readings. If pt has persistent elevated BP readings pt should go directly to ED.pt voiced understanding and will contact office first of next wk with BP readings and again reviewed UC & ED precautions and pt voiced understanding. Sent note to Dr Diona Browner and Butch Penny CMA. ?

## 2021-03-16 NOTE — Telephone Encounter (Signed)
Reviewed in detail.

## 2021-03-16 NOTE — Telephone Encounter (Signed)
[facility] - Client ?TELEPHONE ADVICE RECORD ?[facility]? ?Patient ?Name: ?Isabel Foley ?Isabel Foley ?Gender: Female ?DOB: 12-07-1970 ?Age: 51 Isabel 2 M 3 D ?Return ?Phone ?Number: ?[phone] ?(Primary) ?Address: ?City/ ?State/ ?Zip: [name] ? [address] ?Client [facility] - Client ?Client Site [place] - Day ?Provider Eliezer Lofts - MD ?Contact Type Call ?Who Is Calling Patient / Member / Family / Caregiver ?Call Type Triage / Clinical ?Relationship To Patient Self ?Return Phone Number [phone] (Primary) ?Chief Complaint Blood Pressure High ?Reason for Call Symptomatic / Request for Health Information ?Initial Comment Caller states she had a recent procedure and her ?blood pressure has been elevated. It was 145/110. ?Translation No ?Nurse Assessment ?Nurse: Raphael Gibney, RN, Vanita Ingles Date/Time Eilene Ghazi Time): 03/16/2021 11:56:37 AM ?Confirm and document reason for call. If ?symptomatic, describe symptoms. ?---Caller states her BP was 145/110 yesterday. BP ?118/81 today. History of A fib in family. ?Does the patient have any new or worsening ?symptoms? ---Yes ?Will a triage be completed? ---Yes ?Related visit to physician within the last 2 weeks? ---Yes ?Does the PT have any chronic conditions? (i.e. ?diabetes, asthma, this includes High risk factors for ?pregnancy, etc.) ?---Yes ?List chronic conditions. ---dissected mesenteric artery ?Is the patient pregnant or possibly pregnant? (Ask ?all females between the ages of 79-55) ---No ?Is this a behavioral health or substance abuse call? ---No ?Guidelines ?Guideline Title Affirmed Question Affirmed Notes Nurse Date/Time (Eastern ?Time) ?Blood Pressure - ?High ?Systolic BP >= 846 ?OR Diastolic >= 659 ?Raphael Gibney, RN, [place] 03/16/2021 12:00:55 ?PM ?Disp. Time (Eastern ?Time) Disposition Final User ?03/16/2021 12:10:03 PM See PCP within 24 Hours Yes Raphael Gibney, RN, Vanita Ingles ?PLEASE NOTE: All timestamps contained  within this report are represented as [place] Standard Time. ?CONFIDENTIALTY NOTICE: This fax transmission is intended only for the addressee. It contains information that is legally privileged, confidential or ?otherwise protected from use or disclosure. If you are not the intended recipient, you are strictly prohibited from reviewing, disclosing, copying using ?or disseminating any of this information or taking any action in reliance on or regarding this information. If you have received this fax in error, please ?notify us immediately by telephone so that we can arrange for its return to [place]. Phone: [phone], Toll-Free: [phone], Fax: [phone] ?Page: 2 of 2 ?Call Id: [id number] ?Caller Disagree/Comply Comply ?Caller Understands Yes ?PreDisposition Did not know what to do ?Care Advice Given Per Guideline ?SEE PCP WITHIN 24 HOURS: * IF OFFICE WILL BE OPEN: You need to be examined within the next 24 hours. Call your doctor ?(or NP/PA) when the office opens and make an appointment. HIGH BLOOD PRESSURE: * Untreated high blood pressure may ?cause damage to your heart, brain, kidneys, and eyes. * The goal of blood pressure treatment for most people with hypertension is to ?keep the blood pressure under 140/90. For people that are 60 years or older, your doctor may instead want to keep the blood pressure ?under 150/90. CARE ADVICE given per High Blood Pressure (Adult) guideline. CALL BACK IF: * Weakness or numbness of the ?face, arm or leg on one side of the body occurs * Difficulty walking, difficulty talking, or severe headache occurs * You become ?worse ?Comments ?User: Dannielle Burn, RN Date/Time Eilene Ghazi Time): 03/16/2021 12:18:09 PM ?please call pt back regarding BP. On hold x 11 min for appt. ?Referrals ?REFERRED TO PCP OFFIC ?

## 2021-03-16 NOTE — Telephone Encounter (Signed)
Patient contacted vascular to let them know that her BP was 145/110 ? ?Vascular was concerned about the bottom number and asked her to contact her primary ? ?Patient wants to know what the normal number for her should be ? ?Transferred call to team health ?

## 2021-05-01 ENCOUNTER — Encounter: Payer: Self-pay | Admitting: Family Medicine

## 2021-05-01 ENCOUNTER — Other Ambulatory Visit: Payer: Self-pay | Admitting: Cardiology

## 2021-05-01 DIAGNOSIS — I671 Cerebral aneurysm, nonruptured: Secondary | ICD-10-CM

## 2021-05-01 DIAGNOSIS — E042 Nontoxic multinodular goiter: Secondary | ICD-10-CM

## 2021-05-12 ENCOUNTER — Ambulatory Visit
Admission: RE | Admit: 2021-05-12 | Discharge: 2021-05-12 | Disposition: A | Discharge status: Home / Self Care | Payer: 59 | Source: Ambulatory Visit | Attending: Cardiology | Admitting: Cardiology

## 2021-05-12 DIAGNOSIS — I671 Cerebral aneurysm, nonruptured: Secondary | ICD-10-CM | POA: Insufficient documentation

## 2021-05-29 ENCOUNTER — Encounter: Payer: Self-pay | Admitting: Gastroenterology

## 2021-05-29 ENCOUNTER — Ambulatory Visit (INDEPENDENT_AMBULATORY_CARE_PROVIDER_SITE_OTHER): Payer: 59 | Admitting: Family Medicine

## 2021-05-29 ENCOUNTER — Encounter: Payer: Self-pay | Admitting: Family Medicine

## 2021-05-29 VITALS — BP 90/74 | HR 93 | Temp 98.3°F | Ht 61.5 in | Wt 111.2 lb

## 2021-05-29 DIAGNOSIS — E441 Mild protein-calorie malnutrition: Secondary | ICD-10-CM | POA: Diagnosis not present

## 2021-05-29 DIAGNOSIS — I7779 Dissection of other artery: Secondary | ICD-10-CM | POA: Diagnosis not present

## 2021-05-29 DIAGNOSIS — R1084 Generalized abdominal pain: Secondary | ICD-10-CM | POA: Diagnosis not present

## 2021-05-29 DIAGNOSIS — D219 Benign neoplasm of connective and other soft tissue, unspecified: Secondary | ICD-10-CM | POA: Diagnosis not present

## 2021-05-29 DIAGNOSIS — R42 Dizziness and giddiness: Secondary | ICD-10-CM

## 2021-05-29 DIAGNOSIS — I773 Arterial fibromuscular dysplasia: Secondary | ICD-10-CM | POA: Diagnosis not present

## 2021-05-29 LAB — COMPREHENSIVE METABOLIC PANEL
ALT: 30 U/L (ref 0–35)
AST: 27 U/L (ref 0–37)
Albumin: 4 g/dL (ref 3.5–5.2)
Alkaline Phosphatase: 60 U/L (ref 39–117)
BUN: 17 mg/dL (ref 6–23)
CO2: 29 mEq/L (ref 19–32)
Calcium: 9.5 mg/dL (ref 8.4–10.5)
Chloride: 103 mEq/L (ref 96–112)
Creatinine, Ser: 0.76 mg/dL (ref 0.40–1.20)
GFR: 91.4 mL/min (ref >60.00)
Glucose, Bld: 82 mg/dL (ref 70–99)
Potassium: 4 mEq/L (ref 3.5–5.1)
Sodium: 138 mEq/L (ref 135–145)
Total Bilirubin: 0.4 mg/dL (ref 0.2–1.2)
Total Protein: 7 g/dL (ref 6.0–8.3)

## 2021-05-29 LAB — CBC WITH DIFFERENTIAL/PLATELET
Basophils Absolute: 0.1 10*3/uL (ref 0.0–0.1)
Basophils Relative: 0.7 % (ref 0.0–3.0)
Eosinophils Absolute: 0.1 10*3/uL (ref 0.0–0.7)
Eosinophils Relative: 1.1 % (ref 0.0–5.0)
HCT: 35.2 % — ABNORMAL LOW (ref 36.0–46.0)
Hemoglobin: 11.7 g/dL — ABNORMAL LOW (ref 12.0–15.0)
Lymphocytes Relative: 18.4 % (ref 12.0–46.0)
Lymphs Abs: 1.5 10*3/uL (ref 0.7–4.0)
MCHC: 33.1 g/dL (ref 30.0–36.0)
MCV: 94.4 fl (ref 78.0–100.0)
Monocytes Absolute: 0.7 10*3/uL (ref 0.1–1.0)
Monocytes Relative: 8.7 % (ref 3.0–12.0)
Neutro Abs: 5.9 10*3/uL (ref 1.4–7.7)
Neutrophils Relative %: 71.1 % (ref 43.0–77.0)
Platelets: 322 10*3/uL (ref 150.0–400.0)
RBC: 3.73 Mil/uL — ABNORMAL LOW (ref 3.87–5.11)
RDW: 13.1 % (ref 11.5–15.5)
WBC: 8.3 10*3/uL (ref 4.0–10.5)

## 2021-05-29 LAB — IBC + FERRITIN
Ferritin: 5.8 ng/mL — ABNORMAL LOW (ref 10.0–291.0)
Iron: 36 ug/dL — ABNORMAL LOW (ref 42–145)
Saturation Ratios: 9 % — ABNORMAL LOW (ref 20.0–50.0)
TIBC: 399 ug/dL (ref 250.0–450.0)
Transferrin: 285 mg/dL (ref 212.0–360.0)

## 2021-05-29 LAB — LIPASE: Lipase: 14 U/L (ref 11.0–59.0)

## 2021-05-29 NOTE — Progress Notes (Signed)
Patient ID: Isabel Foley, female    DOB: 03-Oct-1970, 51 y.o.   MRN: [id number]  This visit was conducted in person.  Pulse 93   Temp 98.3 F (36.8 C) (Oral)   Ht 5' 1.5" (1.562 m)   Wt 111 lb 3 oz (50.4 kg)   LMP 05/15/2021 (Approximate)   SpO2 97%   BMI 20.67 kg/m    CC:  Chief Complaint  Patient presents with   Follow-up    After testing and seeing Specialist    Subjective:   HPI: Isabel Foley is a 51 y.o. female presenting on 05/29/2021 for Follow-up (After testing and seeing Specialist)  05/24/21 OV reviewed from [facility] for management of dissection of mesenteric artery, Dr. Hurman Horn  S/P  SMA stenting 04/25/21 Felt diet issues possibly due to lactose intolerance since her surgery.. causing her postprandial abdominal pain and weight loss SMA duplex and a CTA post stenting showing patency of the stent and no evidence of mesenteric ischemia.  MR Head found no evidence of intracranial vasculopathy or aneurysm.   She is on ASA, Eliquis and palvix as prescribed.. plan 12 month of anticoagulation per Dr. Adriana Mccallum. Plans to  follow with repeat carotid dopplers and SMA duplex in 3 months   Today she reports even with trying lactaid and nondairy diet.. she has  still noted abd pain after eating.  She is using muscle relaxants and tylenol.  No improvement with PPI.   Has seen Dr. Maudie Mercury, cardiology FMD specialist.. yearly follow up palnned.  Topiramate and maxalt contraindicated.  Has not had any migraine coming off these.   CT 05/20/2021: CT chest abd pelvics:  LOWER CHEST: Unremarkable.   LIVER: Normal liver contour.  Similar appearance of 1.8 cm hyperattenuating lesion in the hepatic dome (2:12). Unchanged 0.7 cm hypoattenuating lesion in hepatic segment 7. Additional subcentimeter hypoattenuating lesions are too small to characterize by CT.   BILIARY: Cholelithiasis without evidence of acute cholecystitis. No biliary ductal dilatation.      SPLEEN: Normal in size and contour.   PANCREAS: Normal pancreatic contour.  No focal lesions.  No ductal dilation.   ADRENAL GLANDS: Normal appearance of the adrenal glands.   KIDNEYS/URETERS: Symmetric renal enhancement.  No hydronephrosis.  No solid renal mass.   BLADDER: Unremarkable.   REPRODUCTIVE ORGANS: Multifibroid uterus. No suspicious adnexal masses.   GI TRACT: No findings of bowel obstruction or acute inflammation.  Normal appendix.   PERITONEUM, RETROPERITONEUM AND MESENTERY: No free air.  No ascites.  No fluid collection.   LYMPH NODES: No adenopathy.   VESSELS: Hepatic and portal veins are patent.  Normal caliber aorta. 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 SMA stent is in place. The celiac axis, SMA and IMA are patent.   BONES and SOFT TISSUES: No aggressive osseous lesions.  No focal soft tissue lesions.   Has appt to see GYN fibroid, heavy bleeding associated.   In next 1-2 months.  04/26/21 Hg 11.   Body mass index is 20.67 kg/m.  Wt Readings from Last 3 Encounters:  05/29/21 111 lb 3 oz (50.4 kg)  03/09/21 120 lb 4 oz (54.5 kg)  02/22/21 125 lb (56.7 kg)    Relevant past medical, surgical, family and social history reviewed and updated as indicated. Interim medical history since our last visit reviewed. Allergies and medications reviewed and updated. Outpatient Medications Prior to Visit  Medication Sig Dispense Refill   albuterol (VENTOLIN HFA) 108 (90 Base)  MCG/ACT inhaler ProAir HFA 90 mcg/actuation aerosol inhaler  INHALE 2 PUFFS BY MOUTH EVERY 4-6 HOURS AS NEEDED FOR COUGH/WHEEZE     ALPRAZolam (XANAX) 0.5 MG tablet Can use  1 tab daily  as needed for anxiety on long trips. 20 tablet 0   apixaban (ELIQUIS) 5 MG TABS tablet Take by mouth.     ARNUITY ELLIPTA 100 MCG/ACT AEPB INHALE 1 PUFF BY MOUTH DAILY  5   azelastine (OPTIVAR) 0.05 % ophthalmic solution Place 1 drop into both eyes daily as needed.      cetirizine (ZYRTEC) 10 MG  tablet Take 10 mg by mouth daily.     Cholecalciferol (VITAMIN D3) 10 MCG (400 UNIT) CAPS Take 1 Dose by mouth daily. Unsure of dose she takes     clopidogrel (PLAVIX) 75 MG tablet Take 1 tablet (75 mg total) by mouth daily. 30 tablet 1   COLLAGEN PO Take 1 Dose by mouth daily.     cyclobenzaprine (FLEXERIL) 10 MG tablet TAKE 1 TABLET BY MOUTH AT BEDTIME AS NEEDED FOR MUSCLE SPASMS 20 tablet 0   fexofenadine (ALLEGRA) 180 MG tablet Take 180 mg by mouth daily as needed.   5   fluticasone (FLONASE) 50 MCG/ACT nasal spray Place into both nostrils daily.     ipratropium (ATROVENT) 0.06 % nasal spray Place 2 sprays into the nose daily.     mometasone (ELOCON) 0.1 % cream Apply 1 application topically 2 (two) times daily. Prn flares 45 g 1   montelukast (SINGULAIR) 10 MG tablet Take 10 mg by mouth at bedtime.     Multiple Vitamin (MULTIVITAMIN) tablet Take 1 tablet by mouth daily.     Probiotic Product (PROBIOTIC FORMULA PO) Take 1 tablet by mouth daily.     rizatriptan (MAXALT) 10 MG tablet TAKE 1 TABLET (10 MG TOTAL) BY MOUTH AS NEEDED FOR MIGRAINE. MAY REPEAT IN 2 HOURS IF NEEDED 10 tablet 0   topiramate (TOPAMAX) 25 MG tablet Take 1 tablet (25 mg total) by mouth at bedtime as needed. 30 tablet 3   vitamin B-12 (CYANOCOBALAMIN) 500 MCG tablet Take 500 mcg by mouth daily. Unsure of dose she takes daily     tranexamic acid (LYSTEDA) 650 MG TABS tablet TAKE 2 TABLETS BY MOUTH 3 TIMES DAILY. TAKE DURING MENSES FOR MAXIMUM OF 5 DAYS 30 tablet 2   No facility-administered medications prior to visit.     Per HPI unless specifically indicated in ROS section below Review of Systems  Constitutional:  Negative for appetite change, fatigue and fever.  HENT:  Negative for congestion.   Eyes:  Negative for pain.  Respiratory:  Negative for cough and shortness of breath.   Cardiovascular:  Negative for chest pain, palpitations and leg swelling.  Gastrointestinal:  Positive for abdominal pain. Negative for  abdominal distention.  Genitourinary:  Negative for dysuria and vaginal bleeding.  Musculoskeletal:  Negative for back pain.  Neurological:  Negative for syncope, light-headedness and headaches.  Psychiatric/Behavioral:  Negative for dysphoric mood.   Objective:  Pulse 93   Temp 98.3 F (36.8 C) (Oral)   Ht 5' 1.5" (1.562 m)   Wt 111 lb 3 oz (50.4 kg)   LMP 05/15/2021 (Approximate)   SpO2 97%   BMI 20.67 kg/m   Wt Readings from Last 3 Encounters:  05/29/21 111 lb 3 oz (50.4 kg)  03/09/21 120 lb 4 oz (54.5 kg)  02/22/21 125 lb (56.7 kg)      Physical Exam Constitutional:  General: She is not in acute distress.    Appearance: She is underweight. She is not ill-appearing or toxic-appearing.  HENT:     Head: Normocephalic.     Right Ear: Hearing, tympanic membrane, ear canal and external ear normal. Tympanic membrane is not erythematous, retracted or bulging.     Left Ear: Hearing, tympanic membrane, ear canal and external ear normal. Tympanic membrane is not erythematous, retracted or bulging.     Nose: No mucosal edema or rhinorrhea.     Right Sinus: No maxillary sinus tenderness or frontal sinus tenderness.     Left Sinus: No maxillary sinus tenderness or frontal sinus tenderness.     Mouth/Throat:     Pharynx: Uvula midline.  Eyes:     General: Lids are normal. Lids are everted, no foreign bodies appreciated.     Conjunctiva/sclera: Conjunctivae normal.     Pupils: Pupils are equal, round, and reactive to light.  Neck:     Thyroid: No thyroid mass or thyromegaly.     Vascular: No carotid bruit.     Trachea: Trachea normal.  Cardiovascular:     Rate and Rhythm: Normal rate and regular rhythm.     Pulses: Normal pulses.     Heart sounds: Normal heart sounds, S1 normal and S2 normal. No murmur heard.   No friction rub. No gallop.  Pulmonary:     Effort: Pulmonary effort is normal. No tachypnea or respiratory distress.     Breath sounds: Normal breath sounds. No  decreased breath sounds, wheezing, rhonchi or rales.  Abdominal:     General: Bowel sounds are normal.     Palpations: Abdomen is soft.     Tenderness: There is no abdominal tenderness.  Musculoskeletal:     Cervical back: Normal range of motion and neck supple.  Skin:    General: Skin is warm and dry.     Findings: No rash.  Neurological:     Mental Status: She is alert.  Psychiatric:        Mood and Affect: Mood is not anxious or depressed.        Speech: Speech normal.        Behavior: Behavior normal. Behavior is cooperative.        Thought Content: Thought content normal.        Judgment: Judgment normal.      Results for orders placed or performed in visit on 03/09/21  T3, Free  Result Value Ref Range   T3, Free 3.6 2.3 - 4.2 pg/mL  T4, Free  Result Value Ref Range   Free T4 1.06 0.60 - 1.60 ng/dL  TSH  Result Value Ref Range   TSH 1.72 0.35 - 5.50 uIU/mL  Lipid panel  Result Value Ref Range   Cholesterol 140 0 - 200 mg/dL   Triglycerides 93.0 0.0 - 149.0 mg/dL   HDL 51.80 >39.00 mg/dL   VLDL 18.6 0.0 - 40.0 mg/dL   LDL Cholesterol 70 0 - 99 mg/dL   Total CHOL/HDL Ratio 3    NonHDL 88.25      COVID 19 screen:  No recent travel or known exposure to COVID19 The patient denies respiratory symptoms of COVID 19 at this time. The importance of social distancing was discussed today.   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  Followed by vascualr.  Episodic lightheadedness   Relevant Orders   CBC with Differential/Platelet (Completed)   IBC + Ferritin (Completed)   Fibroids     Noted on CT pelvis.  Has heavy vaginal bleeding on 3 blood thinners.  will check cbc and iron panel today.  Has upcoming appt with GYN for treatment of fibroids.       Relevant Orders   CBC with Differential/Platelet (Completed)   IBC + Ferritin (Completed)   Fibromuscular  dysplasia (HCC)     Chronic  Followed by Dr. Maudie [facility]iology in [place].         Generalized postprandial abdominal pain    [facility] vascular felt possible lactose intolerance, but she has been avoiding and still had a episode of pain.  CT scan abd pelvis chest: nonvascular change included benign appearing hepatic cyst, gallstones and fibroid uterus.  Will eval with labs for additional nonvascular causes of abd pain.  Refer to GI for further assessment.       Relevant Orders   Ambulatory referral to Gastroenterology   Lipase (Completed)   Comprehensive metabolic panel (Completed)   Malnutrition of mild degree (HCC)    Due to post prandial pain since stent placed in SMA. 128 lbs down to 11 lbs.  13% weight loss  In last 4 months, BMI now down to 20 Encouraged protein intake and supplements.        Orders Placed This Encounter  Procedures   CBC with Differential/Platelet   IBC + Ferritin   Lipase   Comprehensive metabolic panel   Ambulatory referral to Gastroenterology    Referral Priority:   Routine    Referral Type:   Consultation    Referral Reason:   Specialty Services Required    Number of Visits Requested:   1     Eliezer Lofts, MD

## 2021-05-29 NOTE — Assessment & Plan Note (Signed)
Chronic  Followed by Dr. Maudie [facility] Cardiology in [place].

## 2021-05-29 NOTE — Patient Instructions (Addendum)
Please stop at the lab to have labs drawn.  

## 2021-05-29 NOTE — Assessment & Plan Note (Addendum)
[facility] felt possible lactose intolerance, but she has been avoiding and still had a episode of pain.  CT scan abd pelvis chest: nonvascular change included benign appearing hepatic cyst, gallstones and fibroid uterus.  Will eval with labs for additional nonvascular causes of abd pain.  Refer to GI for further assessment.

## 2021-05-29 NOTE — Assessment & Plan Note (Signed)
Due to post prandial pain since stent placed in SMA. 128 lbs down to 11 lbs.  13% weight loss  In last 4 months, BMI now down to 20 Encouraged protein intake and supplements.

## 2021-05-29 NOTE — Assessment & Plan Note (Signed)
S/P stent.  Followed by vascualr.

## 2021-05-29 NOTE — Assessment & Plan Note (Signed)
Noted on CT pelvis.  Has heavy vaginal bleeding on 3 blood thinners.  will check cbc and iron panel today.  Has upcoming appt with GYN for treatment of fibroids.

## 2021-06-06 NOTE — Telephone Encounter (Signed)
Pt is calling for a status update on her referral for a thyroid ultrasound, pt states she wants to go to [place]. She has "not heard from Dr. Diona Browner since she last saw her on 05/29/2021." Please return a call back when possible, thanks.  Callback Number: [phone]

## 2021-06-19 ENCOUNTER — Encounter: Payer: Self-pay | Admitting: Family Medicine

## 2021-06-19 DIAGNOSIS — R9389 Abnormal findings on diagnostic imaging of other specified body structures: Secondary | ICD-10-CM

## 2021-06-28 ENCOUNTER — Encounter: Payer: Self-pay | Admitting: Gastroenterology

## 2021-06-28 ENCOUNTER — Ambulatory Visit (INDEPENDENT_AMBULATORY_CARE_PROVIDER_SITE_OTHER): Payer: 59 | Admitting: Gastroenterology

## 2021-06-28 VITALS — BP 120/70 | HR 87 | Ht 61.0 in | Wt 113.0 lb

## 2021-06-28 DIAGNOSIS — I773 Arterial fibromuscular dysplasia: Secondary | ICD-10-CM

## 2021-06-28 DIAGNOSIS — R932 Abnormal findings on diagnostic imaging of liver and biliary tract: Secondary | ICD-10-CM

## 2021-06-28 MED ORDER — PANTOPRAZOLE SODIUM 40 MG PO TBEC: 40.0000 mg | DELAYED_RELEASE_TABLET | Freq: Two times a day (BID) | ORAL | 3 refills | Status: DC

## 2021-06-28 NOTE — Progress Notes (Signed)
Referring Provider: Jinny Sanders, MD Primary Care Physician:  Jinny Sanders, MD   Reason for Consultation:  Abdominal pain   IMPRESSION:  Postprandial abdominal pain and 10 pound weight loss following SMA stenting for SMA dissection 04/25/2021 Iron deficiency anemia without overt or occult GI bleeding Fibromuscular dysplasia Possible lactose intolerance Abnormal liver lesion on CT 05/20/21    - 1.8 cm hyperattenuating lesion in the hepatic dome (2:12).     - Unchanged 0.7 cm hypoattenuating lesion in hepatic segment 7.     - Additional subcentimeter hypoattenuating lesions are too small to characterize by CT.   Clinical presentation is most consistent with mesenteric ischemia however her follow-up CT 05/20/21 is reassuring.  She is particularly concerned about gallstones given the family history of symptomatic gallbladder disease     PLAN: - Trial of pantoprazole 40 mg BID - Continue to avoid NSAIDs - HIDA with CCK to evaluate for symptomatic gallbladder disease - Consider cholecystectomy - I've asked her to discuss with her surgeon at [facility] - EGD +/- Colonoscopy  when she is able to stop her Plavix and Eliquis - May need to consider UGI series if endoscopy will be delayed for a while - CT scan results were available after she left the office today but an MRI of the liver would be appropriate to follow-up on the liver lesions seen on recent CT scan    HPI: Isabel Foley is a 51 y.o. female referred by Dr. Diona Browner for further evaluation of abdominal pain.  The history is obtained through the patient and review of her electronic health record.  She has a history of anemia, anxiety, arthritis, allergy induced asthma, asymptomatic gallstones, thyroid goiters, fibromuscular dysplasia, and migraines.  She had SMA dissection treated with SMA stenting 04/25/2021 at [facility] currently on aspirin and 12 months of anticoagulation with Plavix and Eliquis.Marland Kitchen Has had postprandial abdominal pain and  unintentional 10 pound weight loss following the procedure. Pain occurs within 30-45 minutes of eating and can last for up to 12 hours.  In hindsight, postprandial symptoms developed a few weeks prior to her dissection.   Surgical notes from [facility] 05/24/2021 show "The patient had a SMA duplex and a CTA post stenting showing patency of the stent and no evidence of mesenteric ischemia."   The vascular surgeon at [facility] suggested the likelihood of lactose intolerance following surgery because her symptoms were triggered by heavy cream and friend foods.  He recommended Lactaid milk and dairy free ice cream. She has been keeping a food journal. Symptoms persist despite avoiding dairy.  No significant change in symptoms with muscle relaxant, Tylenol, or PPI therapy.    Has seen Dr. Maudie Mercury, a cardiologist with expertise in fibromuscular dysplasia. Yearly follow up planned.  CT Mesenteric Ischemia at [facility] 05/20/2021:  LIVER: Normal liver contour.  Similar appearance of 1.8 cm hyperattenuating lesion in the hepatic dome (2:12). Unchanged 0.7 cm hypoattenuating lesion in hepatic segment 7. Additional subcentimeter hypoattenuating lesions are too small to characterize by CT.  BILIARY: Cholelithiasis without evidence of acute cholecystitis. No biliary ductal dilatation.    SPLEEN: Normal in size and contour.  PANCREAS: Normal pancreatic contour.  No focal lesions.  No ductal dilation.  ADRENAL GLANDS: Normal appearance of the adrenal glands.  KIDNEYS/URETERS: Symmetric renal enhancement.  No hydronephrosis.  No solid renal mass.  BLADDER: Unremarkable.  REPRODUCTIVE ORGANS: Multifibroid uterus. No suspicious adnexal masses.  GI TRACT: No findings of bowel obstruction or acute inflammation.  Normal appendix.  PERITONEUM, RETROPERITONEUM AND MESENTERY: No free air.  No ascites.  No fluid collection.  LYMPH NODES: No adenopathy.  VESSELS: Hepatic and portal veins are patent.  Normal caliber aorta. 1.0 cm distal right  renal artery aneurysm (2:37). SMA stent is in place. The celiac axis, SMA and IMA are patent.  BONES and SOFT TISSUES: No aggressive osseous lesions.  No focal soft tissue lesions.  She uses no NSAIDs and is very cautious about vasconstrictive medications.   She is concerned about gallbladder findings and liver lesions on the CT. Her mother told her that she needs to have her gallbladder removed if she has gallstones. Both of her parents have required gallbladder surgeries and she wonders if this could be hereditary.   Labs 05/29/21 showed normal CMP and lipase. Hgb 11.7, MCV 94, RDW 13, platelets 322, iron 36, ferritin 5.8  Labs prior to surgery show a normal hemoglobin of 12.7.   There is no known family history of colon cancer or polyps. No family history of stomach cancer or other GI malignancy. No family history of inflammatory bowel disease or celiac.    Past Medical History:  Diagnosis Date   Abdominal pain, right upper quadrant    Allergic rhinitis, cause unspecified    Depressive disorder, not elsewhere classified    Dizziness and giddiness    Esophageal reflux    Nontoxic multinodular goiter    Other chronic sinusitis    Other malaise and fatigue    Pain in limb    Peptic ulcer, unspecified site, unspecified as acute or chronic, without mention of hemorrhage, perforation, or obstruction    Premenstrual tension syndromes    Pure hypercholesterolemia    Unspecified hypothyroidism     Past Surgical History:  Procedure Laterality Date   BREAST BIOPSY Right 06/01/2015   stereo, path pending   DILATION AND CURETTAGE OF UTERUS  2002   LEEP  2000    Current Outpatient Medications  Medication Sig Dispense Refill   albuterol (VENTOLIN HFA) 108 (90 Base) MCG/ACT inhaler ProAir HFA 90 mcg/actuation aerosol inhaler  INHALE 2 PUFFS BY MOUTH EVERY 4-6 HOURS AS NEEDED FOR COUGH/WHEEZE     ALPRAZolam (XANAX) 0.5 MG tablet Can use  1 tab daily  as needed for anxiety on long trips. 20  tablet 0   apixaban (ELIQUIS) 5 MG TABS tablet Take by mouth.     ARNUITY ELLIPTA 100 MCG/ACT AEPB INHALE 1 PUFF BY MOUTH DAILY  5   azelastine (OPTIVAR) 0.05 % ophthalmic solution Place 1 drop into both eyes daily as needed.      cetirizine (ZYRTEC) 10 MG tablet Take 10 mg by mouth daily.     Cholecalciferol (VITAMIN D3) 10 MCG (400 UNIT) CAPS Take 1 Dose by mouth daily. Unsure of dose she takes     clopidogrel (PLAVIX) 75 MG tablet Take 1 tablet (75 mg total) by mouth daily. 30 tablet 1   COLLAGEN PO Take 1 Dose by mouth daily.     cyclobenzaprine (FLEXERIL) 10 MG tablet TAKE 1 TABLET BY MOUTH AT BEDTIME AS NEEDED FOR MUSCLE SPASMS 20 tablet 0   fexofenadine (ALLEGRA) 180 MG tablet Take 180 mg by mouth daily as needed.   5   fluticasone (FLONASE) 50 MCG/ACT nasal spray Place into both nostrils daily.     ipratropium (ATROVENT) 0.06 % nasal spray Place 2 sprays into the nose daily.     mometasone (ELOCON) 0.1 % cream Apply 1 application topically 2 (two) times daily. Prn  flares 45 g 1   montelukast (SINGULAIR) 10 MG tablet Take 10 mg by mouth at bedtime.     Multiple Vitamin (MULTIVITAMIN) tablet Take 1 tablet by mouth daily.     Probiotic Product (PROBIOTIC FORMULA PO) Take 1 tablet by mouth daily.     vitamin B-12 (CYANOCOBALAMIN) 500 MCG tablet Take 500 mcg by mouth daily. Unsure of dose she takes daily     No current facility-administered medications for this visit.    Allergies as of 06/28/2021 - Review Complete 02/24/2021  Allergen Reaction Noted   Molds & smuts Shortness Of Breath 04/27/2021    Family History  Problem Relation Age of Onset   Hypertension Father    Hypothyroidism Father    Atrial fibrillation Brother 69   Other Sister        Collagen Vascular Disease   Hyperthyroidism Mother    Breast cancer Cousin     Social History   Socioeconomic History   Marital status: Married    Spouse name: Not on file   Number of children: 7   Years of education: Not on file    Highest education level: Not on file  Occupational History   Occupation: Stay at home mom  Tobacco Use   Smoking status: Never   Smokeless tobacco: Never  Substance and Sexual Activity   Alcohol use: No   Drug use: No   Sexual activity: Yes    Birth control/protection: Surgical    Comment: husband with vasectomy  Other Topics Concern   Not on file  Social History Narrative   7 children; 6 biologic.Marland KitchenMarland KitchenADHD      Married; previously divorced      Stay at 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   Physical Exam: General:   Alert,  well-nourished, pleasant and cooperative in NAD Head:  Normocephalic and atraumatic. Eyes:  Sclera clear, no icterus.   Conjunctiva pink. Ears:  Normal auditory acuity. Nose:  No deformity, discharge,  or lesions. Mouth:  No deformity or lesions.   Neck:  Supple; no masses or thyromegaly. Lungs:  Clear throughout to auscultation.   No wheezes. Heart:  Regular rate and rhythm; no murmurs. Abdomen:  Soft, nontender, nondistended, normal bowel sounds, no rebound or guarding. No hepatosplenomegaly.   Rectal:  Deferred  Msk:  Symmetrical. No boney deformities LAD: No inguinal or umbilical LAD Extremities:  No clubbing or edema. Neurologic:  Alert and  oriented x4;  grossly nonfocal Skin:  Intact without significant lesions or rashes. Psych:  Alert and cooperative. Normal mood and  affect.     Thelda L. Tarri Glenn, MD, MPH 06/28/2021, 9:41 AM

## 2021-07-02 ENCOUNTER — Ambulatory Visit
Admission: RE | Admit: 2021-07-02 | Discharge: 2021-07-02 | Disposition: A | Discharge status: Home / Self Care | Payer: 59 | Source: Ambulatory Visit | Attending: Family Medicine | Admitting: Family Medicine

## 2021-07-02 DIAGNOSIS — E042 Nontoxic multinodular goiter: Secondary | ICD-10-CM | POA: Diagnosis present

## 2021-07-03 ENCOUNTER — Other Ambulatory Visit: Payer: Self-pay | Admitting: Family Medicine

## 2021-07-03 DIAGNOSIS — E042 Nontoxic multinodular goiter: Secondary | ICD-10-CM

## 2021-07-03 DIAGNOSIS — Z8639 Personal history of other endocrine, nutritional and metabolic disease: Secondary | ICD-10-CM

## 2021-07-12 ENCOUNTER — Other Ambulatory Visit (HOSPITAL_COMMUNITY): Payer: 59

## 2021-07-12 ENCOUNTER — Encounter: Payer: Self-pay | Admitting: Gastroenterology

## 2021-07-16 ENCOUNTER — Ambulatory Visit
Admission: RE | Admit: 2021-07-16 | Discharge: 2021-07-16 | Disposition: A | Discharge status: Home / Self Care | Payer: 59 | Source: Ambulatory Visit | Attending: Gastroenterology | Admitting: Gastroenterology

## 2021-07-16 DIAGNOSIS — I773 Arterial fibromuscular dysplasia: Secondary | ICD-10-CM | POA: Diagnosis present

## 2021-07-16 DIAGNOSIS — R932 Abnormal findings on diagnostic imaging of liver and biliary tract: Secondary | ICD-10-CM | POA: Diagnosis not present

## 2021-07-16 MED ORDER — TECHNETIUM TC 99M MEBROFENIN IV KIT
4.9100 | PACK | Freq: Once | INTRAVENOUS | Status: AC | PRN
  Administered 2021-07-16: 4.91 via INTRAVENOUS

## 2021-07-18 ENCOUNTER — Telehealth: Payer: Self-pay

## 2021-07-18 NOTE — Telephone Encounter (Signed)
Received call from patient stating Dr. Hazle Nordmann office had not received fax. Will fax hard copy to office for patient's upcoming appointment.

## 2021-07-24 ENCOUNTER — Telehealth: Payer: Self-pay | Admitting: "Endocrinology

## 2021-07-24 NOTE — Telephone Encounter (Signed)
Left vm

## 2021-07-24 NOTE — Telephone Encounter (Signed)
Dr Dorris Fetch I received a referral on this patient for thyroid nodule but the Primary Doctor stated under the results that no specific nodules were seen. Can she be seen for second opinion?

## 2021-07-26 NOTE — Telephone Encounter (Signed)
Thanks for the followup!  

## 2021-09-12 ENCOUNTER — Encounter: Payer: Self-pay | Admitting: Family Medicine

## 2021-09-13 ENCOUNTER — Ambulatory Visit (INDEPENDENT_AMBULATORY_CARE_PROVIDER_SITE_OTHER): Payer: 59 | Admitting: Family Medicine

## 2021-09-13 ENCOUNTER — Encounter: Payer: Self-pay | Admitting: Family Medicine

## 2021-09-13 VITALS — BP 112/64 | HR 76 | Temp 98.2°F | Ht 61.0 in | Wt 113.0 lb

## 2021-09-13 DIAGNOSIS — N921 Excessive and frequent menstruation with irregular cycle: Secondary | ICD-10-CM | POA: Diagnosis not present

## 2021-09-13 DIAGNOSIS — Z1159 Encounter for screening for other viral diseases: Secondary | ICD-10-CM

## 2021-09-13 DIAGNOSIS — M79641 Pain in right hand: Secondary | ICD-10-CM | POA: Insufficient documentation

## 2021-09-13 LAB — C-REACTIVE PROTEIN: CRP: <1 mg/dL (ref 0.5–20.0)

## 2021-09-13 LAB — SEDIMENTATION RATE: Sed Rate: 21 mm/hr (ref 0–30)

## 2021-09-13 LAB — LUTEINIZING HORMONE: LH: 10.47 m[IU]/mL

## 2021-09-13 LAB — FOLLICLE STIMULATING HORMONE: FSH: 7.3 m[IU]/mL

## 2021-09-13 NOTE — Assessment & Plan Note (Signed)
Acute  Most likely secondary to osteoarthritis from wear and tear but given FMD and autoimmune disease association possibilities we will evaluate with lab work to rule out autoimmune disease specifically rheumatoid arthritis. She can use Tylenol as needed and apply topical Voltaren gel.  NSAIDs are contraindicated given she is on a blood thinner. She does not have Marfan's disease like symptoms.

## 2021-09-13 NOTE — Patient Instructions (Signed)
Can use tylenol for pain or topical Voltaren Gel four times daily as needed for pain.   Please stop at the lab to have labs drawn.

## 2021-09-13 NOTE — Assessment & Plan Note (Signed)
Chronic  She plans upcoming ablation with Dr. Marvel Plan but would like to repeat testing for menopause given her symptoms.  We did discuss to be an exact nests of estradiol, FSH and LH testing.

## 2021-09-13 NOTE — Progress Notes (Signed)
Patient ID: Isabel Foley, female    DOB: July 08, 1970, 52 y.o.   MRN: [id number]  This visit was conducted in person.  BP 112/64   Pulse 76   Temp 98.2 F (36.8 C) (Temporal)   Ht '5\' 1"'$  (1.549 m)   Wt 113 lb (51.3 kg)   SpO2 98%   BMI 21.35 kg/m    CC: Chief Complaint  Patient presents with   Follow-up    Per my chart checking for RA     Subjective:   HPI: Isabel Foley is a 51 y.o. female presenting on 09/13/2021 for Follow-up (Per my chart checking for RA )  She has FMD/dissection of mesenteric artery and is concerned about possibility of associated autoimmune disease.  She has noted pain in her  right base of first finger.  No redness, mild swelling.  Limiting her when trying open  water bottles.   Left  neck and shoulder pain off and on. Associated with migraine.   Occ ache in joints.  Wt Readings from Last 3 Encounters:  09/13/21 113 lb (51.3 kg)  06/28/21 113 lb (51.3 kg)  05/29/21 111 lb 3 oz (50.4 kg)    Sister Dx with collagen vascular disease.   Mother and patient with Raynaud's    Upcoming gallbladder surgery for decreased EF.  She is having more irregular menses, much more heavy than in past. Plans possible ablation per Dr. Marvel Plan. Would like to evaluated for menopause.      Relevant past medical, surgical, family and social history reviewed and updated as indicated. Interim medical history since our last visit reviewed. Allergies and medications reviewed and updated. Outpatient Medications Prior to Visit  Medication Sig Dispense Refill   albuterol (VENTOLIN HFA) 108 (90 Base) MCG/ACT inhaler ProAir HFA 90 mcg/actuation aerosol inhaler  INHALE 2 PUFFS BY MOUTH EVERY 4-6 HOURS AS NEEDED FOR COUGH/WHEEZE     ALPRAZolam (XANAX) 0.5 MG tablet Can use  1 tab daily  as needed for anxiety on long trips. 20 tablet 0   apixaban (ELIQUIS) 5 MG TABS tablet Take 5 mg by mouth 2 (two) times daily.     ARNUITY ELLIPTA 100 MCG/ACT AEPB INHALE 1 PUFF BY  MOUTH DAILY  5   aspirin EC 81 MG tablet Take 81 mg by mouth daily. Swallow whole.     azelastine (OPTIVAR) 0.05 % ophthalmic solution Place 1 drop into both eyes daily as needed.      cetirizine (ZYRTEC) 10 MG tablet Take 10 mg by mouth daily.     Cholecalciferol (VITAMIN D3) 10 MCG (400 UNIT) CAPS Take 1 Dose by mouth daily. Unsure of dose she takes     COLLAGEN PO Take 1 Dose by mouth daily.     cyclobenzaprine (FLEXERIL) 10 MG tablet TAKE 1 TABLET BY MOUTH AT BEDTIME AS NEEDED FOR MUSCLE SPASMS 20 tablet 0   fexofenadine (ALLEGRA) 180 MG tablet Take 180 mg by mouth daily as needed.   5   fluticasone (FLONASE) 50 MCG/ACT nasal spray Place into both nostrils daily.     ipratropium (ATROVENT) 0.06 % nasal spray Place 2 sprays into the nose daily.     mometasone (ELOCON) 0.1 % cream Apply 1 application topically 2 (two) times daily. Prn flares 45 g 1   montelukast (SINGULAIR) 10 MG tablet Take 10 mg by mouth at bedtime.     Multiple Vitamin (MULTIVITAMIN) tablet Take 1 tablet by mouth daily.     Probiotic Product (PROBIOTIC FORMULA PO)  Take 1 tablet by mouth daily.     vitamin B-12 (CYANOCOBALAMIN) 500 MCG tablet Take 500 mcg by mouth daily. Unsure of dose she takes daily     clopidogrel (PLAVIX) 75 MG tablet Take 1 tablet (75 mg total) by mouth daily. (Patient not taking: Reported on 09/13/2021) 30 tablet 1   pantoprazole (PROTONIX) 40 MG tablet Take 1 tablet (40 mg total) by mouth 2 (two) times daily. (Patient not taking: Reported on 09/13/2021) 90 tablet 3   No facility-administered medications prior to visit.     Per HPI unless specifically indicated in ROS section below Review of Systems  Constitutional:  Negative for fatigue and fever.  HENT:  Negative for congestion.   Eyes:  Negative for pain.  Respiratory:  Negative for cough and shortness of breath.   Cardiovascular:  Negative for chest pain, palpitations and leg swelling.  Gastrointestinal:  Negative for abdominal pain.   Genitourinary:  Negative for dysuria and vaginal bleeding.  Musculoskeletal:  Negative for back pain.  Neurological:  Negative for syncope, light-headedness and headaches.  Psychiatric/Behavioral:  Negative for dysphoric mood.    Objective:  BP 112/64   Pulse 76   Temp 98.2 F (36.8 C) (Temporal)   Ht '5\' 1"'$  (1.549 m)   Wt 113 lb (51.3 kg)   SpO2 98%   BMI 21.35 kg/m   Wt Readings from Last 3 Encounters:  09/13/21 113 lb (51.3 kg)  06/28/21 113 lb (51.3 kg)  05/29/21 111 lb 3 oz (50.4 kg)      Physical Exam Constitutional:      General: She is not in acute distress.    Appearance: Normal appearance. She is well-developed. She is not ill-appearing or toxic-appearing.  HENT:     Head: Normocephalic.     Right Ear: Hearing, tympanic membrane, ear canal and external ear normal. Tympanic membrane is not erythematous, retracted or bulging.     Left Ear: Hearing, tympanic membrane, ear canal and external ear normal. Tympanic membrane is not erythematous, retracted or bulging.     Nose: No mucosal edema or rhinorrhea.     Right Sinus: No maxillary sinus tenderness or frontal sinus tenderness.     Left Sinus: No maxillary sinus tenderness or frontal sinus tenderness.     Mouth/Throat:     Pharynx: Uvula midline.  Eyes:     General: Lids are normal. Lids are everted, no foreign bodies appreciated.     Conjunctiva/sclera: Conjunctivae normal.     Pupils: Pupils are equal, round, and reactive to light.  Neck:     Thyroid: No thyroid mass or thyromegaly.     Vascular: No carotid bruit.     Trachea: Trachea normal.  Cardiovascular:     Rate and Rhythm: Normal rate and regular rhythm.     Pulses: Normal pulses.     Heart sounds: Normal heart sounds, S1 normal and S2 normal. No murmur heard.    No friction rub. No gallop.  Pulmonary:     Effort: Pulmonary effort is normal. No tachypnea or respiratory distress.     Breath sounds: Normal breath sounds. No decreased breath sounds,  wheezing, rhonchi or rales.  Abdominal:     General: Bowel sounds are normal.     Palpations: Abdomen is soft.     Tenderness: There is no abdominal tenderness.  Musculoskeletal:     Right hand: Bony tenderness present. No tenderness. Decreased range of motion. Normal strength. Normal sensation. There is no disruption of two-point discrimination.  Normal capillary refill. Normal pulse.     Cervical back: Normal range of motion and neck supple.     Comments: Ttp over MCP on right 2nd digit  Skin:    General: Skin is warm and dry.     Findings: No rash.  Neurological:     Mental Status: She is alert.  Psychiatric:        Mood and Affect: Mood is not anxious or depressed.        Speech: Speech normal.        Behavior: Behavior normal. Behavior is cooperative.        Thought Content: Thought content normal.        Judgment: Judgment normal.      Results for orders placed or performed in visit on 05/29/21  CBC with Differential/Platelet  Result Value Ref Range   WBC 8.3 4.0 - 10.5 K/uL   RBC 3.73 (L) 3.87 - 5.11 Mil/uL   Hemoglobin 11.7 (L) 12.0 - 15.0 g/dL   HCT 35.2 (L) 36.0 - 46.0 %   MCV 94.4 78.0 - 100.0 fl   MCHC 33.1 30.0 - 36.0 g/dL   RDW 13.1 11.5 - 15.5 %   Platelets 322.0 150.0 - 400.0 K/uL   Neutrophils Relative % 71.1 43.0 - 77.0 %   Lymphocytes Relative 18.4 12.0 - 46.0 %   Monocytes Relative 8.7 3.0 - 12.0 %   Eosinophils Relative 1.1 0.0 - 5.0 %   Basophils Relative 0.7 0.0 - 3.0 %   Neutro Abs 5.9 1.4 - 7.7 K/uL   Lymphs Abs 1.5 0.7 - 4.0 K/uL   Monocytes Absolute 0.7 0.1 - 1.0 K/uL   Eosinophils Absolute 0.1 0.0 - 0.7 K/uL   Basophils Absolute 0.1 0.0 - 0.1 K/uL  IBC + Ferritin  Result Value Ref Range   Iron 36 (L) 42 - 145 ug/dL   Transferrin 285.0 212.0 - 360.0 mg/dL   Saturation Ratios 9.0 (L) 20.0 - 50.0 %   Ferritin 5.8 (L) 10.0 - 291.0 ng/mL   TIBC 399.0 250.0 - 450.0 mcg/dL  Lipase  Result Value Ref Range   Lipase 14.0 11.0 - 59.0 U/L   Comprehensive metabolic panel  Result Value Ref Range   Sodium 138 135 - 145 mEq/L   Potassium 4.0 3.5 - 5.1 mEq/L   Chloride 103 96 - 112 mEq/L   CO2 29 19 - 32 mEq/L   Glucose, Bld 82 70 - 99 mg/dL   BUN 17 6 - 23 mg/dL   Creatinine, Ser 0.76 0.40 - 1.20 mg/dL   Total Bilirubin 0.4 0.2 - 1.2 mg/dL   Alkaline Phosphatase 60 39 - 117 U/L   AST 27 0 - 37 U/L   ALT 30 0 - 35 U/L   Total Protein 7.0 6.0 - 8.3 g/dL   Albumin 4.0 3.5 - 5.2 g/dL   GFR 91.40 >60.00 mL/min   Calcium 9.5 8.4 - 10.5 mg/dL     COVID 19 screen:  No recent travel or known exposure to COVID19 The patient denies respiratory symptoms of COVID 19 at this time. The importance of social distancing was discussed today.   Assessment and Plan Problem List Items Addressed This Visit     Hand pain, right - Primary    Acute  Most likely secondary to osteoarthritis from wear and tear but given FMD and autoimmune disease association possibilities we will evaluate with lab work to rule out autoimmune disease specifically rheumatoid arthritis. She can use Tylenol  as needed and apply topical Voltaren gel.  NSAIDs are contraindicated given she is on a blood thinner. She does not have Marfan's disease like symptoms.      Relevant Orders   ANA   C-reactive protein   Cyclic citrul peptide antibody, IgG   Sedimentation rate   Rheumatoid factor   Menorrhagia with irregular cycle    Chronic  She plans upcoming ablation with Dr. Marvel Plan but would like to repeat testing for menopause given her symptoms.  We did discuss to be an exact nests of estradiol, FSH and LH testing.      Relevant Orders   Estradiol   Follicle stimulating hormone   Luteinizing hormone   Other Visit Diagnoses     Need for hepatitis C screening test       Relevant Orders   Hepatitis C antibody          Eliezer Lofts, MD

## 2021-09-14 ENCOUNTER — Encounter: Payer: Self-pay | Admitting: Family Medicine

## 2021-09-17 LAB — RHEUMATOID FACTOR: Rheumatoid fact SerPl-aCnc: <14 IU/mL (ref <14)

## 2021-09-17 LAB — CYCLIC CITRUL PEPTIDE ANTIBODY, IGG: Cyclic Citrullin Peptide Ab: <16 UNITS

## 2021-09-17 LAB — ESTRADIOL: Estradiol: 293 pg/mL

## 2021-09-17 LAB — ANA: Anti Nuclear Antibody (ANA): POSITIVE — AB

## 2021-09-17 LAB — HEPATITIS C ANTIBODY: Hepatitis C Ab: NONREACTIVE

## 2021-09-17 LAB — ANTI-NUCLEAR AB-TITER (ANA TITER): ANA Titer 1: 1:160 {titer} — ABNORMAL HIGH

## 2021-09-18 ENCOUNTER — Encounter: Payer: Self-pay | Admitting: Family Medicine

## 2021-09-18 DIAGNOSIS — I773 Arterial fibromuscular dysplasia: Secondary | ICD-10-CM

## 2021-09-18 DIAGNOSIS — R768 Other specified abnormal immunological findings in serum: Secondary | ICD-10-CM

## 2021-09-18 DIAGNOSIS — I7779 Dissection of other artery: Secondary | ICD-10-CM

## 2021-09-20 ENCOUNTER — Other Ambulatory Visit (INDEPENDENT_AMBULATORY_CARE_PROVIDER_SITE_OTHER): Payer: 59

## 2021-09-20 DIAGNOSIS — Z1211 Encounter for screening for malignant neoplasm of colon: Secondary | ICD-10-CM | POA: Diagnosis not present

## 2021-09-20 LAB — FECAL OCCULT BLOOD, IMMUNOCHEMICAL: Fecal Occult Bld: NEGATIVE

## 2021-11-05 ENCOUNTER — Encounter (INDEPENDENT_AMBULATORY_CARE_PROVIDER_SITE_OTHER): Payer: Self-pay

## 2021-11-05 ENCOUNTER — Encounter: Payer: Self-pay | Admitting: Family Medicine

## 2021-11-07 ENCOUNTER — Encounter: Payer: Self-pay | Admitting: Family Medicine

## 2021-11-07 ENCOUNTER — Telehealth: Payer: Self-pay | Admitting: Family Medicine

## 2021-11-07 NOTE — Telephone Encounter (Signed)
Results received and placed in Dr. Rometta Emery office in box.

## 2021-11-07 NOTE — Telephone Encounter (Signed)
I will review the results when I am back in the office

## 2021-11-07 NOTE — Telephone Encounter (Signed)
Patient called and stated that [facility] is sending results over and she wants to know what the next steps are.

## 2021-11-09 NOTE — Telephone Encounter (Signed)
Unable to get patient in with [facility] - they deferred the referral to [place] who attempted to contact the patient several times for an appt and closed the referrals. They sent her a letter in [place] to contact their office. I have sent a message back to the patient with the letter below as this is really our only option at this point in getting the patient seen anytime soon with Endo.   [facility] [address] [place], [place]  [address] Phone:  [phone]    Fax:  [phone]   August 13, 2021   Anjenette Gerbino [address] [place] [address]   Dear Ms. Haughey,   We received your records from Eliezer Lofts MD  to be seen by one of our providers.  We have attempted to contact your via phone call with no success.  Please call us at your earliest convenience at the number above.  We look forward to participating in your care.   Sincerely, [facility]

## 2021-11-12 NOTE — Telephone Encounter (Signed)
I had recommended further testing for this patient.. I believe I wrote it on the paper copy of labs... have you seen this?

## 2021-11-12 NOTE — Telephone Encounter (Signed)
Yes,  I have the lab results and patient is scheduled to see you tomorrow 11/13/21.

## 2021-11-13 ENCOUNTER — Encounter: Payer: Self-pay | Admitting: Family Medicine

## 2021-11-13 ENCOUNTER — Ambulatory Visit (INDEPENDENT_AMBULATORY_CARE_PROVIDER_SITE_OTHER): Payer: 59 | Admitting: Family Medicine

## 2021-11-13 VITALS — BP 120/72 | HR 80 | Temp 98.3°F | Ht 61.0 in | Wt 113.1 lb

## 2021-11-13 DIAGNOSIS — I773 Arterial fibromuscular dysplasia: Secondary | ICD-10-CM | POA: Diagnosis not present

## 2021-11-13 DIAGNOSIS — I7779 Dissection of other artery: Secondary | ICD-10-CM

## 2021-11-13 DIAGNOSIS — D500 Iron deficiency anemia secondary to blood loss (chronic): Secondary | ICD-10-CM | POA: Diagnosis not present

## 2021-11-13 NOTE — Progress Notes (Signed)
Patient ID: Isabel Foley, female    DOB: Jul 18, 1970, 51 y.o.   MRN: [id number]  This visit was conducted in person.  BP 120/72   Pulse 80   Temp 98.3 F (36.8 C) (Oral)   Ht '5\' 1"'$  (1.549 m)   Wt 113 lb 2 oz (51.3 kg)   LMP 10/26/2021 (Approximate)   SpO2 98%   BMI 21.37 kg/m    CC: Chief Complaint  Patient presents with   Follow-up    Labs from [facility]    Subjective:   HPI: Isabel Foley is a 51 y.o. female  with history of fibromiuscular dysplasia, mesenteric  artery dissection presenting on 11/13/2021 for Follow-up (Labs from [place])  Recent Hg drop from 11/7 in 05/2021 to 8.6. She is on  Eliquis and plavix.   She has continued fatigue and feeling cold.  She has been having heavy periods... schedule for ablation.Marland Kitchen 12/06/2021  Went off  hormonal treatment for heavy menses in 05/2021 She has had decreased po intake given health issues.   No blood in stool. 09/20/2021 negative ifob. No nose bleeds.   Not able to take any iron ( tried prenatal) given abdominal pain.   She had questions about CT incidental findings.  Similar appearance of vague arterially enhancing lesion in the central hepatic dome which is not element on venous phase imaging consistent with a benign hemangioma. Additional tiny subcentimeter low-attenuation lesions are also noted and while too small to characterize these are statistically highly likely benign cysts.         Relevant past medical, surgical, family and social history reviewed and updated as indicated. Interim medical history since our last visit reviewed. Allergies and medications reviewed and updated. Outpatient Medications Prior to Visit  Medication Sig Dispense Refill   albuterol (VENTOLIN HFA) 108 (90 Base) MCG/ACT inhaler ProAir HFA 90 mcg/actuation aerosol inhaler  INHALE 2 PUFFS BY MOUTH EVERY 4-6 HOURS AS NEEDED FOR COUGH/WHEEZE     ALPRAZolam (XANAX) 0.5 MG tablet Can use  1 tab daily  as needed for anxiety on long  trips. 20 tablet 0   apixaban (ELIQUIS) 5 MG TABS tablet Take 5 mg by mouth 2 (two) times daily.     ARNUITY ELLIPTA 100 MCG/ACT AEPB INHALE 1 PUFF BY MOUTH DAILY  5   aspirin EC 81 MG tablet Take 81 mg by mouth daily. Swallow whole.     azelastine (OPTIVAR) 0.05 % ophthalmic solution Place 1 drop into both eyes daily as needed.      cetirizine (ZYRTEC) 10 MG tablet Take 10 mg by mouth daily.     Cholecalciferol (VITAMIN D3) 10 MCG (400 UNIT) CAPS Take 1 Dose by mouth daily. Unsure of dose she takes     COLLAGEN PO Take 1 Dose by mouth daily.     cyclobenzaprine (FLEXERIL) 10 MG tablet TAKE 1 TABLET BY MOUTH AT BEDTIME AS NEEDED FOR MUSCLE SPASMS 20 tablet 0   fexofenadine (ALLEGRA) 180 MG tablet Take 180 mg by mouth daily as needed.   5   fluticasone (FLONASE) 50 MCG/ACT nasal spray Place into both nostrils daily.     ipratropium (ATROVENT) 0.06 % nasal spray Place 2 sprays into the nose daily.     mometasone (ELOCON) 0.1 % cream Apply 1 application topically 2 (two) times daily. Prn flares 45 g 1   montelukast (SINGULAIR) 10 MG tablet Take 10 mg by mouth at bedtime.     Multiple Vitamin (MULTIVITAMIN) tablet Take 1 tablet by  mouth daily.     Probiotic Product (PROBIOTIC FORMULA PO) Take 1 tablet by mouth daily.     Ubrogepant (UBRELVY) 100 MG TABS Take 1 tablet by mouth daily as needed (For Migraines). Can repeat dose after 72 hours     vitamin B-12 (CYANOCOBALAMIN) 500 MCG tablet Take 500 mcg by mouth daily. Unsure of dose she takes daily     clopidogrel (PLAVIX) 75 MG tablet Take 1 tablet (75 mg total) by mouth daily. (Patient not taking: Reported on 09/13/2021) 30 tablet 1   pantoprazole (PROTONIX) 40 MG tablet Take 1 tablet (40 mg total) by mouth 2 (two) times daily. (Patient not taking: Reported on 09/13/2021) 90 tablet 3   No facility-administered medications prior to visit.     Per HPI unless specifically indicated in ROS section below Review of Systems  Constitutional:  Positive for  fatigue. Negative for fever.  HENT:  Negative for congestion.   Eyes:  Negative for pain.  Respiratory:  Negative for cough and shortness of breath.   Cardiovascular:  Negative for chest pain, palpitations and leg swelling.  Gastrointestinal:  Negative for abdominal pain.  Genitourinary:  Negative for dysuria and vaginal bleeding.  Musculoskeletal:  Negative for back pain.  Neurological:  Negative for syncope, light-headedness and headaches.  Psychiatric/Behavioral:  Negative for dysphoric mood.    Objective:  BP 120/72   Pulse 80   Temp 98.3 F (36.8 C) (Oral)   Ht '5\' 1"'$  (1.549 m)   Wt 113 lb 2 oz (51.3 kg)   LMP 10/26/2021 (Approximate)   SpO2 98%   BMI 21.37 kg/m   Wt Readings from Last 3 Encounters:  11/13/21 113 lb 2 oz (51.3 kg)  09/13/21 113 lb (51.3 kg)  06/28/21 113 lb (51.3 kg)      Physical Exam    Results for orders placed or performed in visit on 09/20/21  Fecal occult blood, imunochemical   Specimen: Stool  Result Value Ref Range   Fecal Occult Bld Negative Negative     COVID 19 screen:  No recent travel or known exposure to COVID19 The patient denies respiratory symptoms of COVID 19 at this time. The importance of social distancing was discussed today.   Assessment and Plan  Problem List Items Addressed This Visit     Dissection of mesenteric artery (HCC)   Fibromuscular dysplasia (HCC)   Iron deficiency anemia due to chronic blood loss - Primary   Relevant Orders   CBC with Differential/Platelet (Completed)   IBC + Ferritin (Completed)    Please stop at the lab to have labs drawn.  Start prenatal vitamin, increase iron in diet. If unable to tolerate, call for referral to hematologist.   If can tolerate.. schedule lab only check for  cbc and iron panel in 1-2 month.     Eliezer Lofts, MD

## 2021-11-13 NOTE — Patient Instructions (Addendum)
Please stop at the lab to have labs drawn.  Start prenatal vitamin, increase iron in diet. If unable to tolerate, call for referral to hematologist.   If can tolerate.. schedule lab only check for  cbc and iron panel in 1-2 month.

## 2021-11-14 ENCOUNTER — Encounter: Payer: Self-pay | Admitting: Family Medicine

## 2021-11-14 DIAGNOSIS — D500 Iron deficiency anemia secondary to blood loss (chronic): Secondary | ICD-10-CM

## 2021-11-14 LAB — CBC WITH DIFFERENTIAL/PLATELET
Basophils Absolute: 0.1 10*3/uL (ref 0.0–0.1)
Basophils Relative: 1.3 % (ref 0.0–3.0)
Eosinophils Absolute: 0.2 10*3/uL (ref 0.0–0.7)
Eosinophils Relative: 1.9 % (ref 0.0–5.0)
HCT: 27.5 % — ABNORMAL LOW (ref 36.0–46.0)
Hemoglobin: 8.8 g/dL — ABNORMAL LOW (ref 12.0–15.0)
Lymphocytes Relative: 23.4 % (ref 12.0–46.0)
Lymphs Abs: 1.8 10*3/uL (ref 0.7–4.0)
MCHC: 32 g/dL (ref 30.0–36.0)
MCV: 77.4 fl — ABNORMAL LOW (ref 78.0–100.0)
Monocytes Absolute: 0.8 10*3/uL (ref 0.1–1.0)
Monocytes Relative: 10.8 % (ref 3.0–12.0)
Neutro Abs: 4.9 10*3/uL (ref 1.4–7.7)
Neutrophils Relative %: 62.6 % (ref 43.0–77.0)
Platelets: 301 10*3/uL (ref 150.0–400.0)
RBC: 3.56 Mil/uL — ABNORMAL LOW (ref 3.87–5.11)
RDW: 15.2 % (ref 11.5–15.5)
WBC: 7.9 10*3/uL (ref 4.0–10.5)

## 2021-11-14 LAB — IBC + FERRITIN
Ferritin: 2.3 ng/mL — ABNORMAL LOW (ref 10.0–291.0)
Iron: 17 ug/dL — ABNORMAL LOW (ref 42–145)
Saturation Ratios: 3.8 % — ABNORMAL LOW (ref 20.0–50.0)
TIBC: 443.8 ug/dL (ref 250.0–450.0)
Transferrin: 317 mg/dL (ref 212.0–360.0)

## 2021-11-20 ENCOUNTER — Encounter: Payer: Self-pay | Admitting: Internal Medicine

## 2021-11-20 ENCOUNTER — Inpatient Hospital Stay: Payer: 59 | Attending: Internal Medicine | Admitting: Internal Medicine

## 2021-11-20 ENCOUNTER — Inpatient Hospital Stay: Payer: 59

## 2021-11-20 VITALS — BP 115/82 | HR 83 | Temp 96.6°F | Resp 20 | Wt 113.7 lb

## 2021-11-20 DIAGNOSIS — Z7901 Long term (current) use of anticoagulants: Secondary | ICD-10-CM | POA: Insufficient documentation

## 2021-11-20 DIAGNOSIS — E039 Hypothyroidism, unspecified: Secondary | ICD-10-CM | POA: Insufficient documentation

## 2021-11-20 DIAGNOSIS — Z803 Family history of malignant neoplasm of breast: Secondary | ICD-10-CM | POA: Diagnosis not present

## 2021-11-20 DIAGNOSIS — D500 Iron deficiency anemia secondary to blood loss (chronic): Secondary | ICD-10-CM | POA: Insufficient documentation

## 2021-11-20 DIAGNOSIS — D509 Iron deficiency anemia, unspecified: Secondary | ICD-10-CM | POA: Diagnosis present

## 2021-11-20 NOTE — Progress Notes (Signed)
[place]  Telephone:([phone] [phone]  ID: [name] OB: 1970-12-21  MR#: [id number]  NOI#:[id number]  Patient Care Team: Jinny Sanders, MD as PCP - General  REFERRING PROVIDER: Dr. Eliezer Lofts  REASON FOR REFERRAL: IDA  HPI: Isabel Foley is a 51 y.o. female with past medical history of gallstones, hypothyroidism, hyperlipidemia and fibromuscular dysplasia causing SMA dissection follows at [facility] was referred to hematology for management of iron deficiency anemia.  Patient reports history of heavy menses.  Since she had SMA dissection she was placed on triple therapy with aspirin, Plavix and Eliquis in February 2023.  Now she is on aspirin and Eliquis only.  Her periods have gotten heavier with blood thinners.  Denies any bleeding in stools or urine.  Her stool occult was negative. Most recent labs showed hemoglobin of 8.8, ferritin 2 and saturation 3.8%.  She is scheduled for uterine ablation procedure on November 30.  Reports dizziness and shortness of breath on exertion.  She has weight loss due to limitations in what she can eat with her dissection.  Energy level is low.  Feels cold.  REVIEW OF SYSTEMS:   ROS  As per HPI. Otherwise, a complete review of systems is negative.  PAST MEDICAL HISTORY: Past Medical History:  Diagnosis Date   Abdominal pain, right upper quadrant    Allergic rhinitis, cause unspecified    Aneurysm of right renal artery (HCC)    Depressive disorder, not elsewhere classified    Dizziness and giddiness    Esophageal reflux    Fibromuscular dysplasia (HCC)    Gallstones    Liver lesion    Nontoxic multinodular goiter    Other chronic sinusitis    Other malaise and fatigue    Pain in limb    Peptic ulcer, unspecified site, unspecified as acute or chronic, without mention of hemorrhage, perforation, or obstruction    Premenstrual tension syndromes    Pure hypercholesterolemia    Unspecified hypothyroidism      PAST SURGICAL HISTORY: Past Surgical History:  Procedure Laterality Date   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     with 2 stents   BREAST BIOPSY Right 06/01/2015   stereo, path pending   DILATION AND CURETTAGE OF UTERUS  01/08/2000   LEEP  01/07/1998    FAMILY HISTORY: Family History  Problem Relation Age of Onset   Hyperthyroidism Mother    Irritable bowel syndrome Mother    Colon polyps Father    Hypertension Father    Hypothyroidism Father    Other Sister        Collagen Vascular Disease   Heart disease Brother    Atrial fibrillation Brother 104   Breast cancer Cousin     HEALTH MAINTENANCE: Social History   Tobacco Use   Smoking status: Never   Smokeless tobacco: Never  Substance Use Topics   Alcohol use: No   Drug use: No     Allergies  Allergen Reactions   Molds & Smuts Shortness Of Breath    Current Outpatient Medications  Medication Sig Dispense Refill   albuterol (VENTOLIN HFA) 108 (90 Base) MCG/ACT inhaler ProAir HFA 90 mcg/actuation aerosol inhaler  INHALE 2 PUFFS BY MOUTH EVERY 4-6 HOURS AS NEEDED FOR COUGH/WHEEZE     ALPRAZolam (XANAX) 0.5 MG tablet Can use  1 tab daily  as needed for anxiety on long trips. 20 tablet 0   apixaban (ELIQUIS) 5 MG TABS tablet Take 5 mg by mouth 2 (  two) times daily.     ARNUITY ELLIPTA 100 MCG/ACT AEPB INHALE 1 PUFF BY MOUTH DAILY  5   aspirin EC 81 MG tablet Take 81 mg by mouth daily. Swallow whole.     azelastine (OPTIVAR) 0.05 % ophthalmic solution Place 1 drop into both eyes daily as needed.      cetirizine (ZYRTEC) 10 MG tablet Take 10 mg by mouth daily.     Cholecalciferol (VITAMIN D3) 10 MCG (400 UNIT) CAPS Take 1 Dose by mouth daily. Unsure of dose she takes     COLLAGEN PO Take 1 Dose by mouth daily.     cyclobenzaprine (FLEXERIL) 10 MG tablet TAKE 1 TABLET BY MOUTH AT BEDTIME AS NEEDED FOR MUSCLE SPASMS 20 tablet 0   fexofenadine (ALLEGRA) 180 MG tablet Take 180 mg by mouth daily as needed.    5   fluticasone (FLONASE) 50 MCG/ACT nasal spray Place into both nostrils daily.     ipratropium (ATROVENT) 0.06 % nasal spray Place 2 sprays into the nose daily.     mometasone (ELOCON) 0.1 % cream Apply 1 application topically 2 (two) times daily. Prn flares 45 g 1   montelukast (SINGULAIR) 10 MG tablet Take 10 mg by mouth at bedtime.     Multiple Vitamin (MULTIVITAMIN) tablet Take 1 tablet by mouth daily.     Probiotic Product (PROBIOTIC FORMULA PO) Take 1 tablet by mouth daily.     Ubrogepant (UBRELVY) 100 MG TABS Take 1 tablet by mouth daily as needed (For Migraines). Can repeat dose after 72 hours     vitamin B-12 (CYANOCOBALAMIN) 500 MCG tablet Take 500 mcg by mouth daily. Unsure of dose she takes daily     No current facility-administered medications for this visit.    OBJECTIVE: Vitals:   11/20/21 1149  BP: 115/82  Pulse: 83  Resp: 20  Temp: (!) 96.6 F (35.9 C)  SpO2: 100%     Body mass index is 21.48 kg/m.      General: Well-developed, well-nourished, no acute distress. Eyes: Pink conjunctiva, anicteric sclera. HEENT: Normocephalic, moist mucous membranes, clear oropharnyx. Lungs: Clear to auscultation bilaterally. Heart: Regular rate and rhythm. No rubs, murmurs, or gallops. Abdomen: Soft, nontender, nondistended. No organomegaly noted, normoactive bowel sounds. Musculoskeletal: No edema, cyanosis, or clubbing. Neuro: Alert, answering all questions appropriately. Cranial nerves grossly intact. Skin: No rashes or petechiae noted. Psych: Normal affect. Lymphatics: No cervical, calvicular, axillary or inguinal LAD.   LAB RESULTS:  Lab Results  Component Value Date   NA 138 05/29/2021   K 4.0 05/29/2021   CL 103 05/29/2021   CO2 29 05/29/2021   GLUCOSE 82 05/29/2021   BUN 17 05/29/2021   CREATININE 0.76 05/29/2021   CALCIUM 9.5 05/29/2021   PROT 7.0 05/29/2021   ALBUMIN 4.0 05/29/2021   AST 27 05/29/2021   ALT 30 05/29/2021   ALKPHOS 60 05/29/2021    BILITOT 0.4 05/29/2021   GFRNONAA >60 02/09/2021   GFRAA 98 08/05/2019    Lab Results  Component Value Date   WBC 7.9 11/13/2021   NEUTROABS 4.9 11/13/2021   HGB 8.8 Repeated and verified X2. (L) 11/13/2021   HCT 27.5 (L) 11/13/2021   MCV 77.4 (L) 11/13/2021   PLT 301.0 11/13/2021    Lab Results  Component Value Date   TIBC 443.8 11/13/2021   TIBC 399.0 05/29/2021   FERRITIN 2.3 (L) 11/13/2021   FERRITIN 5.8 (L) 05/29/2021   IRONPCTSAT 3.8 (L) 11/13/2021   IRONPCTSAT 9.0 (L)  05/29/2021     STUDIES: No results found.  ASSESSMENT AND PLAN:   Isabel Foley is a 51 y.o. female with pmh of of gallstones, hypothyroidism, hyperlipidemia and fibromuscular dysplasia causing SMA dissection follows at [facility] was referred to hematology for management of iron deficiency anemia.  #Iron Deficiency anemia -Progressive in nature.  Likely secondary to heavy menstrual period worsened with blood thinners. -Most recent labs showed hemoglobin 8.8, ferritin 2 and saturation 3.8%. -Discussed with the patient about IV Venofer x5 over 2 to 3 weeks.  Low but potential risk of anaphylactic reaction was also discussed.  Stool occult was negative.  She is scheduled for uterine ablation procedure on November 30.  RTC in 10 weeks for MD visit, labs  Patient expressed understanding and was in agreement with this plan. She also understands that She can call clinic at any time with any questions, concerns, or complaints.   I spent a total of 45 minutes reviewing chart data, face-to-face evaluation with the patient, counseling and coordination of care as detailed above.  Jane Canary, MD   11/20/2021 12:52 PM

## 2021-11-21 ENCOUNTER — Inpatient Hospital Stay: Payer: 59

## 2021-11-21 VITALS — BP 122/84 | HR 82 | Temp 99.1°F | Resp 18

## 2021-11-21 DIAGNOSIS — D509 Iron deficiency anemia, unspecified: Secondary | ICD-10-CM

## 2021-11-21 MED ORDER — SODIUM CHLORIDE 0.9 % IV SOLN
Freq: Once | INTRAVENOUS | Status: AC
  Filled 2021-11-21: qty 250

## 2021-11-21 MED ORDER — SODIUM CHLORIDE 0.9 % IV SOLN
200.0000 mg | Freq: Once | INTRAVENOUS | Status: AC
  Administered 2021-11-21: 200 mg via INTRAVENOUS
  Filled 2021-11-21: qty 200

## 2021-11-21 NOTE — Patient Instructions (Signed)

## 2021-11-23 MED FILL — Iron Sucrose Inj 20 MG/ML (Fe Equiv): INTRAVENOUS | Qty: 10 | Status: AC

## 2021-11-26 ENCOUNTER — Inpatient Hospital Stay: Payer: 59

## 2021-11-26 VITALS — BP 109/75 | HR 81 | Temp 97.4°F | Resp 18

## 2021-11-26 DIAGNOSIS — D509 Iron deficiency anemia, unspecified: Secondary | ICD-10-CM

## 2021-11-26 MED ORDER — SODIUM CHLORIDE 0.9 % IV SOLN
Freq: Once | INTRAVENOUS | Status: AC
  Filled 2021-11-26: qty 250

## 2021-11-26 MED ORDER — SODIUM CHLORIDE 0.9 % IV SOLN
200.0000 mg | Freq: Once | INTRAVENOUS | Status: AC
  Administered 2021-11-26: 200 mg via INTRAVENOUS
  Filled 2021-11-26: qty 200

## 2021-11-27 MED FILL — Iron Sucrose Inj 20 MG/ML (Fe Equiv): INTRAVENOUS | Qty: 10 | Status: AC

## 2021-11-28 ENCOUNTER — Inpatient Hospital Stay: Payer: 59

## 2021-11-28 ENCOUNTER — Ambulatory Visit: Payer: 59

## 2021-11-28 VITALS — BP 120/78 | HR 84 | Temp 97.5°F | Resp 19

## 2021-11-28 DIAGNOSIS — D509 Iron deficiency anemia, unspecified: Secondary | ICD-10-CM

## 2021-11-28 MED ORDER — SODIUM CHLORIDE 0.9 % IV SOLN
Freq: Once | INTRAVENOUS | Status: AC
  Filled 2021-11-28: qty 250

## 2021-11-28 MED ORDER — SODIUM CHLORIDE 0.9 % IV SOLN
200.0000 mg | Freq: Once | INTRAVENOUS | Status: AC
  Administered 2021-11-28: 200 mg via INTRAVENOUS
  Filled 2021-11-28: qty 200

## 2021-11-28 NOTE — Patient Instructions (Signed)

## 2021-12-03 ENCOUNTER — Inpatient Hospital Stay: Payer: 59

## 2021-12-03 VITALS — BP 109/71 | HR 89 | Temp 96.4°F | Resp 18

## 2021-12-03 DIAGNOSIS — D509 Iron deficiency anemia, unspecified: Secondary | ICD-10-CM | POA: Diagnosis not present

## 2021-12-03 MED ORDER — SODIUM CHLORIDE 0.9 % IV SOLN
200.0000 mg | Freq: Once | INTRAVENOUS | Status: AC
  Administered 2021-12-03: 200 mg via INTRAVENOUS
  Filled 2021-12-03: qty 10

## 2021-12-03 MED ORDER — SODIUM CHLORIDE 0.9 % IV SOLN
Freq: Once | INTRAVENOUS | Status: AC
  Filled 2021-12-03: qty 250

## 2021-12-03 NOTE — Patient Instructions (Signed)

## 2021-12-05 ENCOUNTER — Inpatient Hospital Stay: Payer: 59

## 2021-12-05 ENCOUNTER — Encounter (HOSPITAL_COMMUNITY): Payer: Self-pay | Admitting: Obstetrics and Gynecology

## 2021-12-05 ENCOUNTER — Other Ambulatory Visit: Payer: Self-pay

## 2021-12-05 VITALS — BP 107/67 | HR 85 | Resp 18

## 2021-12-05 DIAGNOSIS — D509 Iron deficiency anemia, unspecified: Secondary | ICD-10-CM

## 2021-12-05 MED ORDER — SODIUM CHLORIDE 0.9 % IV SOLN
200.0000 mg | Freq: Once | INTRAVENOUS | Status: AC
  Administered 2021-12-05: 200 mg via INTRAVENOUS
  Filled 2021-12-05: qty 200

## 2021-12-05 MED ORDER — SODIUM CHLORIDE 0.9 % IV SOLN
Freq: Once | INTRAVENOUS | Status: AC
  Filled 2021-12-05: qty 250

## 2021-12-05 NOTE — H&P (Signed)
Isabel Foley is an 51 y.o. female G6P6 who presents for a scheduled hysteroscopy/possible polypectomy and novasure ablation.   She has a history of heavy menses/fibroids leading to anemia in the setting of a complicated fibromuscular dysplasia that limits her choices.  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 Given her weak vessels from this fibromuscular dysplasia she has to remain on blood thinners, and hormones and TXA are contraindicated.  She ultimately got two stents placed for her dissection when had subsequent occlusion on 04/25/21--gets re-imaged in October. Has known renal artery aneurysm and some carotid issues they are watching. Her anemia has required iron transfusions and her menorrhagia is substantial.    Pertinent Gynecological History:  OB History: NSVD x 6  Menstrual History:  Patient's last menstrual period was 11/25/2021 (exact date).    Past Medical History:  Diagnosis Date   Abdominal pain, right upper quadrant    Allergic rhinitis, cause unspecified    Allergy-induced asthma    Anemia    Aneurysm of right renal artery (HCC)    Anxiety    Arthritis    Depressive disorder, not elsewhere classified    Dizziness and giddiness    Esophageal reflux    Fibromuscular dysplasia (HCC)    Gallstones    Headache    Liver lesion    Nontoxic multinodular goiter    Other chronic sinusitis    Other malaise and fatigue    Pain in limb    Peptic ulcer, unspecified site, unspecified as acute or chronic, without mention of hemorrhage, perforation, or obstruction    Premenstrual tension syndromes    Pure hypercholesterolemia    Unspecified hypothyroidism     Past Surgical History:  Procedure Laterality Date   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     with 2 stents   BREAST BIOPSY Right 06/01/2015   stereo, path pending   DILATION AND CURETTAGE OF UTERUS  01/08/2000    LEEP  01/07/1998    Family History  Problem Relation Age of Onset   Hyperthyroidism Mother    Irritable bowel syndrome Mother    Colon polyps Father    Hypertension Father    Hypothyroidism Father    Other Sister        Collagen Vascular Disease   Heart disease Brother    Atrial fibrillation Brother 20   Breast cancer Cousin     Social History:  reports that she has never smoked. She has never used smokeless tobacco. She reports that she does not drink alcohol and does not use drugs.  Allergies:  Allergies  Allergen Reactions   Molds & Smuts Shortness Of Breath    No medications prior to admission.    Review of Systems  Constitutional:  Negative for fever.  Respiratory:  Negative for shortness of breath.   Gastrointestinal:  Negative for abdominal pain.  Genitourinary:  Positive for menstrual problem.    Last menstrual period 11/25/2021. Physical Exam Constitutional:      Appearance: Normal appearance.  Cardiovascular:     Rate and Rhythm: Normal rate and regular rhythm.  Pulmonary:     Effort: Pulmonary effort is normal.  Abdominal:     Palpations: Abdomen is soft.  Genitourinary:    General: Normal vulva.  Neurological:     Mental Status: She is alert.  Psychiatric:        Mood and Affect: Mood normal.  No results found for this or any previous visit (from the past 24 hour(s)).  No results found.  Assessment/Plan: The patient was counseled on the novasure procedure in detail.  The risks of bleeding and infection and possible uterine perforation were reviewed.  We discussed that the procedure will usually reduce bleeding significantly, but may not eliminate periods.  We also discussed that she should not perform this procedure if she desires any future pregnancies.  She plans no future childbearing and has a contraceptive method in place as the procedure is not birth control. Given her complicated medical history her vascular surgeon suggests she be done  in the [place] and has contacted our vascular team so they are aware of her history. She will use cytotec 2 hours prior to procedure and is on lovenox instead of eliquis for the days prior.    [name] 12/05/2021, 4:59 PM

## 2021-12-05 NOTE — Progress Notes (Signed)
Anesthesia Chart Review:  Case: [id number] Date/Time: 12/06/21 0715   Procedure: DILATATION & CURETTAGE/HYSTEROSCOPY WITH NOVASURE ABLATION   Anesthesia type: Choice   Pre-op diagnosis: menorrhagia   Location: [facility] OR ROOM 08 / [facility]   Surgeons: Paula Compton, MD       DISCUSSION: It is a 51 year old female scheduled for the above procedure.  History includes never smoker, fibromuscular dysplasia (with SMA dissection s/p stenting 04/15/21), right renal artery aneurysm, anemia, multinodular goiter, hypothyroidism, GERD, hypercholesterolemia, gallstones.  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 Her blood pressure is controlled with out the use of antihypertensive medications today at 100/80. She initially was on triple therapy for SMA stent with aspirin, Plavix, Eliquis although now has been taken off of Plavix and remains on aspirin and Eliquis per vascular surgery.   She had [facility] follow-up with Leroy Libman, PA on 12/04/21 to review recent echo and Holter monitor. Echo showed normal LV wall motion, EF 45-50%. If symptomatic would consider additional testing. No significant arrhythmias on Holter monitor, so continue to monitor. Three month follow-up planned.   Last vascular surgery visit was 10/25/21. S/p SMA stenting 04/25/21. Carotid duplex showed no significant plaque and and mesenteric artery duplex shows patent SMA stent. Per Dr. Adriana Mccallum, "If patient needs any procedures, okay to Lovenox bridge and continue with ASA 81 mg.  RTC in 6 months with mesenteric duplex. Return precautions reviewed."  Per 11/20/21 HEM notes by Dr. Darrall Dears for IDA, likely due to heavy menses and blood thinners. Plan for IV Venofer x5 over 2-3 weeks with plans for uterine  ablation on 12/06/21.   Reported preoperative anticoagulation instructions: Last Elquis 11/30/21 with Lovenox bridge, last dose 11/25/21. Continue ASA.   She is a same day work-up. Anesthesia team to evaluate on the day of surgery. Last H/H 8.8/27.5 on 11/13/21. She his s/p Venofer. Would plan for updated labs on the day of surgery.   VS: LMP 11/25/2021 (Exact Date)  BP Readings from Last 3 Encounters:  12/03/21 109/71  11/28/21 120/78  11/26/21 109/75   Pulse Readings from Last 3 Encounters:  12/03/21 89  11/28/21 84  11/26/21 81     PROVIDERS: Jinny Sanders, MD is PCP  Serafina Royals, MD is cardiologist Jefm Bryant) Brain Hilts, MD is [facility] at [place]. Seen for FMD 04/30/32.  Ephriam Jenkins, MD is rheumatologist Hurman Horn, MD is vascular surgeon Jane Canary, MD is HEM   LABS: Most recent lab results in [facility] include: Lab Results  Component Value Date   WBC 7.9 11/13/2021   HGB 8.8 Repeated and verified X2. (L) 11/13/2021   HCT 27.5 (L) 11/13/2021   PLT 301.0 11/13/2021   GLUCOSE 82 05/29/2021   CHOL 140 03/09/2021   TRIG 93.0 03/09/2021   HDL 51.80 03/09/2021   LDLCALC 70 03/09/2021   ALT 30 05/29/2021   AST 27 05/29/2021   NA 138 05/29/2021   K 4.0 05/29/2021   CL 103 05/29/2021   CREATININE 0.76 05/29/2021   BUN 17 05/29/2021   CO2 29 05/29/2021   TSH 1.72 03/09/2021   INR 1.0 02/08/2021   HGBA1C 5.1 08/05/2019     IMAGES: US Thyroid 07/02/21: IMPRESSION: Heterogeneous thyroid suggesting medical thyroid disease.  CT Mesenteric 05/20/21: -- No evidence of mesenteric ischemia as clinically  questioned.  -- 1.0 cm distal right renal artery aneurysm.  -- Unchanged hyperattenuating lesion within the hepatic dome. Consider abdominal MRI for further characterization.  -- Other incidental or chronic findings, as above.   MRA Head 05/12/21: MPRESSION: No evidence of intracranial vasculopathy or aneurysm.    Renal  duplex 04/26/21: Final Interpretation  Right: No evidence of renal artery stenosis. Kidney size and  RI are WNL. There is evidence of flow in the right  renal vein. Evidence of FMD as described above.  Left: No evidence of renal artery stenosis. Kidney size and  RI are WNL. There is evidence of flow in the left renal  vein.   CTA Aorta 03/06/21 [facility]): --No thoracic aortic aneurysm, intramural hematoma, or dissection.  --Redemonstrated aneurysmal dilation of the SMA with associated dissection flap extending into the replaced common hepatic artery and middle colic artery. Findings are unchanged compared to prior.  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  --Beaded appearance of the bilateral renal arteries consistent with multifocal fibromuscular dysplasia (FMD), similar to prior.  --Findings consistent with FMD of bilateral external iliac arteries.  --Stable hyperattenuating lesion at the hepatic dome. As before, findings are favored to represent hemangioma however additional nonemergent follow-up with MRI of the abdomen could be considered for further evaluation.  --Heterogeneous and nodular thyroid. Nonemergent thyroid ultrasound could be considered for further evaluation.  --Cholelithiasis without evidence of cholecystitis.  --Additional chronic and incidental findings, as above.   CTA neck 03/06/21 [facility]): FINDINGS:  - No evidence of dissection or aneurysm in the carotid or vertebral arteries. No hemodynamically significant arterial stenosis.  - The soft tissues are unremarkable without lymphadenopathy, mass or abscess. Heterogeneous multinodular thyroid. Mild degenerative changes of the cervical spine particularly at C5-C6. The airway is patent and midline. The lung apices are unremarkable.  EKG: 09/28/21 [facility]): NSR with sinus arrhythmia.   CV: US Carotid 10/25/21 [facility]): Final Interpretation  Right Carotid: There was no evidence of  thrombus, dissection, atherosclerotic                 plaque or stenosis in the cervical carotid system.  Left Carotid: There was no evidence of thrombus, dissection, atherosclerotic                plaque or stenosis in the cervical carotid system.  Vertebrals:  Both vertebral arteries were patent with antegrade flow.  Subclavians: Normal flow hemodynamics were seen in bilateral subclavian               arteries.    [place] Mesenteric Artery Duplex 10/25/21: Final Interpretation  Mesenteric:  Normal celiac artery, IMA, splenic artery and SMA findings.  Other: Findings unchanged.   Holter monitor (as outlined in 12/04/21 [facility] note): "Holter monitor showed Baseline normal sinus rhythm with maximum heart rate of 170 bpm minimum of 58 bpm and average of 88 bpm. There is occasional premature atrial and preventricular contraction. Heart rate is variable depending on activity levels with no evidence of advanced heart block symptomatic bradycardia and/or malignant tachycardia. There was 3 beats of supraventricular tachycardia."  Echo 10/10/21: INTERPRETATION  NORMAL LEFT VENTRICULAR SYSTOLIC FUNCTION. Normal LV wall motion. EF 45-50%. NORMAL RIGHT VENTRICULAR SYSTOLIC FUNCTION  MILD VALVULAR REGURGITATION (MILD MR, TR)  NO VALVULAR STENOSIS     Past Medical History:  Diagnosis Date   Abdominal pain, right upper quadrant    Allergic rhinitis, cause unspecified    Allergy-induced asthma  Anemia    Aneurysm of right renal artery (HCC)    Anxiety    Arthritis    Depressive disorder, not elsewhere classified    Dizziness and giddiness    Esophageal reflux    Fibromuscular dysplasia (HCC)    Gallstones    Headache    Liver lesion    Nontoxic multinodular goiter    Other chronic sinusitis    Other malaise and fatigue    Pain in limb    Peptic ulcer, unspecified site, unspecified as acute or chronic, without mention of hemorrhage, perforation, or obstruction    Premenstrual  tension syndromes    Pure hypercholesterolemia    Unspecified hypothyroidism     Past Surgical History:  Procedure Laterality Date   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     with 2 stents   BREAST BIOPSY Right 06/01/2015   stereo, path pending   DILATION AND CURETTAGE OF UTERUS  01/08/2000   LEEP  01/07/1998    MEDICATIONS: No current facility-administered medications for this encounter.    acetaminophen (TYLENOL) 500 MG tablet   albuterol (VENTOLIN HFA) 108 (90 Base) MCG/ACT inhaler   ALPRAZolam (XANAX) 0.5 MG tablet   ARNUITY ELLIPTA 100 MCG/ACT AEPB   aspirin 81 MG chewable tablet   azelastine (OPTIVAR) 0.05 % ophthalmic solution   cetirizine (ZYRTEC) 10 MG tablet   Cholecalciferol (DIALYVITE VITAMIN D 5000) 125 MCG (5000 UT) capsule   COLLAGEN PO   cyanocobalamin (VITAMIN B12) 1000 MCG tablet   cyclobenzaprine (FLEXERIL) 5 MG tablet   diclofenac Sodium (VOLTAREN) 1 % GEL   diphenhydrAMINE (BENADRYL) 25 MG tablet   enoxaparin (LOVENOX) 60 MG/0.6ML injection   fexofenadine (ALLEGRA) 180 MG tablet   fluticasone (FLONASE) 50 MCG/ACT nasal spray   hydrocortisone cream 1 %   ipratropium (ATROVENT) 0.06 % nasal spray   mometasone (ELOCON) 0.1 % cream   montelukast (SINGULAIR) 10 MG tablet   Multiple Vitamin (MULTIVITAMIN) tablet   OVER THE COUNTER MEDICATION   PREBIOTIC PRODUCT PO   Probiotic Product (PROBIOTIC FORMULA PO)   Ubrogepant (UBRELVY) 100 MG TABS   apixaban (ELIQUIS) 5 MG TABS tablet   cyclobenzaprine (FLEXERIL) 10 MG tablet   misoprostol (CYTOTEC) 200 MCG tablet    Myra Gianotti, PA-C Surgical Short Stay/Anesthesiology [facility] Phone [phone] [facility] Phone [phone] 12/05/2021 4:08 PM

## 2021-12-05 NOTE — Anesthesia Preprocedure Evaluation (Addendum)
Anesthesia Evaluation  Patient identified by MRN, date of birth, ID band Patient awake    Reviewed: Allergy & Precautions, NPO status , Patient's Chart, lab work & pertinent test results  Airway Mallampati: I  TM Distance: >3 FB Neck ROM: Full    Dental  (+) Teeth Intact, Dental Advisory Given   Pulmonary asthma    Pulmonary exam normal breath sounds clear to auscultation       Cardiovascular + Peripheral Vascular Disease (Acute SMA dissection in February 2023 resulting in mesenteric ischemia necessitating SMA stent x 2 performed in April 2023. 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 Normal LV wall motion. EF 45-50%. NORMAL RIGHT VENTRICULAR SYSTOLIC FUNCTION  MILD VALVULAR REGURGITATION (MILD MR, TR)  NO VALVULAR STENOSIS     Neuro/Psych  Headaches PSYCHIATRIC DISORDERS Anxiety Depression       GI/Hepatic Neg liver ROS, PUD,GERD  ,,  Endo/Other  Hypothyroidism    Renal/GU  Aneurysm of right renal artery     Musculoskeletal  (+) Arthritis ,    Abdominal   Peds  Hematology  (+) Blood dyscrasia (Eliquis), anemia   Anesthesia Other Findings   Reproductive/Obstetrics menorrhagia                             Anesthesia Physical Anesthesia Plan  ASA: 3  Anesthesia Plan: General   Post-op Pain Management: Tylenol PO (pre-op)*   Induction: Intravenous  PONV Risk Score and Plan: 4 or greater and Scopolamine patch - Pre-op, Midazolam, Dexamethasone and Ondansetron  Airway Management Planned: LMA  Additional Equipment:   Intra-op Plan:   Post-operative Plan: Extubation in OR  Informed Consent: I have reviewed the patients History and Physical, chart, labs and discussed the  procedure including the risks, benefits and alternatives for the proposed anesthesia with the patient or authorized representative who has indicated his/her understanding and acceptance.     Dental advisory given  Plan Discussed with: CRNA  Anesthesia Plan Comments: (PAT note written 12/05/2021 by Myra Gianotti, PA-C.  )       Anesthesia Quick Evaluation

## 2021-12-05 NOTE — Progress Notes (Signed)
SDW CALL  Patient was given pre-op instructions over the phone. Patient verbalized understanding of instructions provided.   PCP - Dr. Eliezer Lofts Cardiologist - Dr. Albertine Patricia and Dr. Maudie Mercury Vascular- Dr. Adriana Mccallum Hematology: Dr. Darrall Dears (pt reports receiving 5 of 5 iron infusion today)  Chest x-ray - n/a EKG - 09-28-21-- copy of tracing requested Stress Test - denies ECHO - 10-10-2021 Cardiac Cath - denies   Blood Thinner Instructions: LD Eliquis on 11/25 with Lovenox [place], Last Lovenox dose 11/29 Aspirin Instructions: Continue ASA per Vascular MD  ERAS Protcol - clears until 0430 PRE-SURGERY Ensure or G2-   COVID TEST- n/a   Anesthesia review: Yes-- HX of FMD. Pt with 2 stents after SMA dissection  Patient denies shortness of breath, fever, cough and chest pain over the phone call

## 2021-12-06 ENCOUNTER — Encounter (HOSPITAL_COMMUNITY): Payer: Self-pay | Admitting: Obstetrics and Gynecology

## 2021-12-06 ENCOUNTER — Ambulatory Visit (HOSPITAL_COMMUNITY): Payer: 59 | Admitting: Vascular Surgery

## 2021-12-06 ENCOUNTER — Ambulatory Visit (HOSPITAL_BASED_OUTPATIENT_CLINIC_OR_DEPARTMENT_OTHER): Payer: 59 | Admitting: Vascular Surgery

## 2021-12-06 ENCOUNTER — Ambulatory Visit (HOSPITAL_COMMUNITY)
Admission: RE | Admit: 2021-12-06 | Discharge: 2021-12-06 | Disposition: A | Discharge status: Home / Self Care | Payer: 59 | Source: Ambulatory Visit | Attending: Obstetrics and Gynecology | Admitting: Obstetrics and Gynecology

## 2021-12-06 ENCOUNTER — Other Ambulatory Visit: Payer: Self-pay

## 2021-12-06 ENCOUNTER — Encounter (HOSPITAL_COMMUNITY)
Admission: RE | Disposition: A | Discharge status: Home / Self Care | Payer: Self-pay | Source: Ambulatory Visit | Attending: Obstetrics and Gynecology

## 2021-12-06 DIAGNOSIS — Z8711 Personal history of peptic ulcer disease: Secondary | ICD-10-CM | POA: Diagnosis not present

## 2021-12-06 DIAGNOSIS — D500 Iron deficiency anemia secondary to blood loss (chronic): Secondary | ICD-10-CM

## 2021-12-06 DIAGNOSIS — K219 Gastro-esophageal reflux disease without esophagitis: Secondary | ICD-10-CM | POA: Insufficient documentation

## 2021-12-06 DIAGNOSIS — I773 Arterial fibromuscular dysplasia: Secondary | ICD-10-CM | POA: Diagnosis not present

## 2021-12-06 DIAGNOSIS — J45909 Unspecified asthma, uncomplicated: Secondary | ICD-10-CM | POA: Diagnosis not present

## 2021-12-06 DIAGNOSIS — N920 Excessive and frequent menstruation with regular cycle: Secondary | ICD-10-CM

## 2021-12-06 DIAGNOSIS — E039 Hypothyroidism, unspecified: Secondary | ICD-10-CM

## 2021-12-06 DIAGNOSIS — N921 Excessive and frequent menstruation with irregular cycle: Secondary | ICD-10-CM

## 2021-12-06 DIAGNOSIS — D638 Anemia in other chronic diseases classified elsewhere: Secondary | ICD-10-CM

## 2021-12-06 HISTORY — PX: DILITATION & CURRETTAGE/HYSTROSCOPY WITH NOVASURE ABLATION: SHX5568

## 2021-12-06 HISTORY — DX: Anemia, unspecified: D64.9

## 2021-12-06 HISTORY — DX: Unspecified asthma, uncomplicated: J45.909

## 2021-12-06 HISTORY — DX: Anxiety disorder, unspecified: F41.9

## 2021-12-06 HISTORY — DX: Headache, unspecified: R51.9

## 2021-12-06 HISTORY — DX: Unspecified osteoarthritis, unspecified site: M19.90

## 2021-12-06 LAB — TYPE AND SCREEN
ABO/RH(D): A POS
Antibody Screen: NEGATIVE

## 2021-12-06 LAB — BASIC METABOLIC PANEL
Anion gap: 5 (ref 5–15)
BUN: 12 mg/dL (ref 6–20)
CO2: 25 mmol/L (ref 22–32)
Calcium: 8.6 mg/dL — ABNORMAL LOW (ref 8.9–10.3)
Chloride: 108 mmol/L (ref 98–111)
Creatinine, Ser: 0.72 mg/dL (ref 0.44–1.00)
GFR, Estimated: >60 mL/min (ref >60)
Glucose, Bld: 73 mg/dL (ref 70–99)
Potassium: 3.6 mmol/L (ref 3.5–5.1)
Sodium: 138 mmol/L (ref 135–145)

## 2021-12-06 LAB — CBC
HCT: 31.7 % — ABNORMAL LOW (ref 36.0–46.0)
Hemoglobin: 9.8 g/dL — ABNORMAL LOW (ref 12.0–15.0)
MCH: 26.5 pg (ref 26.0–34.0)
MCHC: 30.9 g/dL (ref 30.0–36.0)
MCV: 85.7 fL (ref 80.0–100.0)
Platelets: 306 10*3/uL (ref 150–400)
RBC: 3.7 MIL/uL — ABNORMAL LOW (ref 3.87–5.11)
RDW: 22.1 % — ABNORMAL HIGH (ref 11.5–15.5)
WBC: 6.9 10*3/uL (ref 4.0–10.5)
nRBC: 0 % (ref 0.0–0.2)

## 2021-12-06 LAB — ABO/RH: ABO/RH(D): A POS

## 2021-12-06 LAB — POCT PREGNANCY, URINE: Preg Test, Ur: NEGATIVE

## 2021-12-06 SURGERY — DILATATION & CURETTAGE/HYSTEROSCOPY WITH NOVASURE ABLATION
Anesthesia: General

## 2021-12-06 MED ORDER — MIDAZOLAM HCL 2 MG/2ML IJ SOLN
INTRAMUSCULAR | Status: DC | PRN
  Administered 2021-12-06: 2 mg via INTRAVENOUS
  Administered 2021-12-06: 1 mg via INTRAVENOUS

## 2021-12-06 MED ORDER — FENTANYL CITRATE (PF) 250 MCG/5ML IJ SOLN
INTRAMUSCULAR | Status: AC
  Filled 2021-12-06: qty 5

## 2021-12-06 MED ORDER — PROPOFOL 10 MG/ML IV BOLUS
INTRAVENOUS | Status: DC | PRN
  Administered 2021-12-06 (×2): 100 mg via INTRAVENOUS

## 2021-12-06 MED ORDER — MIDAZOLAM HCL 2 MG/2ML IJ SOLN
INTRAMUSCULAR | Status: AC
  Filled 2021-12-06: qty 2

## 2021-12-06 MED ORDER — PHENYLEPHRINE 80 MCG/ML (10ML) SYRINGE FOR IV PUSH (FOR BLOOD PRESSURE SUPPORT)
PREFILLED_SYRINGE | INTRAVENOUS | Status: DC | PRN
  Administered 2021-12-06: 160 ug via INTRAVENOUS
  Administered 2021-12-06: 80 ug via INTRAVENOUS
  Administered 2021-12-06: 240 ug via INTRAVENOUS
  Administered 2021-12-06 (×2): 80 ug via INTRAVENOUS

## 2021-12-06 MED ORDER — LIDOCAINE 2% (20 MG/ML) 5 ML SYRINGE
INTRAMUSCULAR | Status: AC
  Filled 2021-12-06: qty 5

## 2021-12-06 MED ORDER — ONDANSETRON HCL 4 MG/2ML IJ SOLN
INTRAMUSCULAR | Status: DC | PRN
  Administered 2021-12-06: 4 mg via INTRAVENOUS

## 2021-12-06 MED ORDER — ONDANSETRON HCL 4 MG/2ML IJ SOLN
INTRAMUSCULAR | Status: AC
  Filled 2021-12-06: qty 2

## 2021-12-06 MED ORDER — LIDOCAINE 2% (20 MG/ML) 5 ML SYRINGE
INTRAMUSCULAR | Status: DC | PRN
  Administered 2021-12-06: 100 mg via INTRAVENOUS

## 2021-12-06 MED ORDER — ORAL CARE MOUTH RINSE: 15.0000 mL | Freq: Once | OROMUCOSAL | Status: AC

## 2021-12-06 MED ORDER — FENTANYL CITRATE (PF) 250 MCG/5ML IJ SOLN
INTRAMUSCULAR | Status: DC | PRN
  Administered 2021-12-06: 50 ug via INTRAVENOUS

## 2021-12-06 MED ORDER — ROCURONIUM BROMIDE 10 MG/ML (PF) SYRINGE
PREFILLED_SYRINGE | INTRAVENOUS | Status: AC
  Filled 2021-12-06: qty 10

## 2021-12-06 MED ORDER — LACTATED RINGERS IV SOLN: INTRAVENOUS | Status: DC

## 2021-12-06 MED ORDER — DEXAMETHASONE SODIUM PHOSPHATE 10 MG/ML IJ SOLN
INTRAMUSCULAR | Status: AC
  Filled 2021-12-06: qty 1

## 2021-12-06 MED ORDER — OXYCODONE HCL 5 MG PO TABS: 5.0000 mg | ORAL_TABLET | ORAL | 0 refills | Status: DC | PRN

## 2021-12-06 MED ORDER — SCOPOLAMINE 1 MG/3DAYS TD PT72
1.0000 | MEDICATED_PATCH | Freq: Once | TRANSDERMAL | Status: DC
  Administered 2021-12-06: 1.5 mg via TRANSDERMAL
  Filled 2021-12-06: qty 1

## 2021-12-06 MED ORDER — PROPOFOL 10 MG/ML IV BOLUS
INTRAVENOUS | Status: AC
  Filled 2021-12-06: qty 20

## 2021-12-06 MED ORDER — SODIUM CHLORIDE 0.9 % IR SOLN
Status: DC | PRN
  Administered 2021-12-06: 3000 mL

## 2021-12-06 MED ORDER — DEXAMETHASONE SODIUM PHOSPHATE 10 MG/ML IJ SOLN
INTRAMUSCULAR | Status: DC | PRN
  Administered 2021-12-06: 8 mg via INTRAVENOUS

## 2021-12-06 MED ORDER — ONDANSETRON 4 MG PO TBDP: 4.0000 mg | ORAL_TABLET | Freq: Three times a day (TID) | ORAL | 0 refills | Status: DC | PRN

## 2021-12-06 MED ORDER — ACETAMINOPHEN 500 MG PO TABS
1000.0000 mg | ORAL_TABLET | Freq: Once | ORAL | Status: AC
  Administered 2021-12-06: 1000 mg via ORAL
  Filled 2021-12-06: qty 2

## 2021-12-06 MED ORDER — CHLORHEXIDINE GLUCONATE 0.12 % MT SOLN
15.0000 mL | Freq: Once | OROMUCOSAL | Status: AC
  Administered 2021-12-06: 15 mL via OROMUCOSAL
  Filled 2021-12-06: qty 15

## 2021-12-06 MED ORDER — ONDANSETRON HCL 4 MG/2ML IJ SOLN: 4.0000 mg | Freq: Once | INTRAMUSCULAR | Status: DC | PRN

## 2021-12-06 MED ORDER — POVIDONE-IODINE 10 % EX SWAB
2.0000 | Freq: Once | CUTANEOUS | Status: AC
  Administered 2021-12-06: 2 via TOPICAL

## 2021-12-06 MED ORDER — SILVER NITRATE-POT NITRATE 75-25 % EX MISC
CUTANEOUS | Status: DC | PRN
  Administered 2021-12-06: 1

## 2021-12-06 MED ORDER — LIDOCAINE HCL (PF) 1 % IJ SOLN
INTRAMUSCULAR | Status: AC
  Filled 2021-12-06: qty 30

## 2021-12-06 MED ORDER — LIDOCAINE HCL 1 % IJ SOLN
INTRAMUSCULAR | Status: DC | PRN
  Administered 2021-12-06: 20 mL

## 2021-12-06 MED ORDER — FENTANYL CITRATE (PF) 100 MCG/2ML IJ SOLN: 25.0000 ug | INTRAMUSCULAR | Status: DC | PRN

## 2021-12-06 SURGICAL SUPPLY — 14 items
ABLATOR SURESOUND NOVASURE (ABLATOR) IMPLANT
CATH ROBINSON RED A/P 16FR (CATHETERS) ×1 IMPLANT
CYLINDER CO2 NOVASURE GENER (MISCELLANEOUS) IMPLANT
ELECT REM PT RETURN 9FT ADLT (ELECTROSURGICAL)
ELECTRODE REM PT RTRN 9FT ADLT (ELECTROSURGICAL) IMPLANT
GLOVE BIO SURGEON STRL SZ 6.5 (GLOVE) ×1 IMPLANT
GLOVE SURG UNDER POLY LF SZ7 (GLOVE) ×1 IMPLANT
GOWN STRL REUS W/ TWL LRG LVL3 (GOWN DISPOSABLE) ×2 IMPLANT
GOWN STRL REUS W/TWL LRG LVL3 (GOWN DISPOSABLE) ×2
KIT PROCEDURE FLUENT (KITS) ×1 IMPLANT
NOVASURE GENER CO2 CL (MISCELLANEOUS) ×1
PACK VAGINAL MINOR WOMEN LF (CUSTOM PROCEDURE TRAY) ×1 IMPLANT
PAD OB MATERNITY 4.3X12.25 (PERSONAL CARE ITEMS) ×1 IMPLANT
TOWEL GREEN STERILE FF (TOWEL DISPOSABLE) ×2 IMPLANT

## 2021-12-06 NOTE — Op Note (Signed)
Operative Note    Preoperative Diagnosis Menorrhagia Fibromuscular dysplasia with vascular involvement  Postoperative Diagnosis Same  Procedure Hysteroscopy with novasure endometrial ablation  Surgeon Paula Compton, MD  Anesthesia LMA  Fluids: EBL 41m Hysteroscopic deficit 830mUOP voided prior to procedure IVF [id number]  Findings The uterus was upper limits of normal size, cavity WNL.  Cavity length was 5cm and width 4cm.  Treatment time 2 minutes with power at 110.  Cavity with normal postablative appearance on posttreatment look.    Specimen Endometrial curetting  Procedure Note Patient was taken to the operating room where LMA anesthesia was obtained without difficulty. She was then prepped and draped in the normal sterile fashion in the dorsal lithotomy position. An appropriate time out was performed. A speculum was then placed within the vagina and the anterior lip of the cervix identified and injected with approximately 2 cc of 1% plain lidocaine. An additional 9 cc each was placed at 2 and 10:00 for a paracervical block. Uterus was then sounded to 9 cm and the cervical length measured at 3.5 cm.  The Pratt dilators utilized to dilate the cervix up to approximately 24. TThe hysterosscope was introduced into the cavity and the findings noted as previously stated.  The hysteroscope was then removed and the Novasure device inserted to the top of the fundus and deployed.  A cavity width of 4.0 noted.  The device was activated with a treatment time of 120 secs. The hysteroscope was then replaced and the cavity noted to have a good treatment effect with blanching and no viable endometrium apparent.  All instruments were removed from the vagina.  The tenaculum site was hemostatic.  Finally the speculum was removed from the vagina and the patient awakened and taken to the recovery room in good condition.

## 2021-12-06 NOTE — Transfer of Care (Signed)
Immediate Anesthesia Transfer of Care Note  Patient: Isabel Foley  Procedure(s) Performed: DILATATION & CURETTAGE/HYSTEROSCOPY WITH NOVASURE ABLATION  Patient Location: PACU  Anesthesia Type:General  Level of Consciousness: lethargic  Airway & Oxygen Therapy: Patient Spontanous Breathing and Patient connected to nasal cannula oxygen  Post-op Assessment: Report given to RN  Post vital signs: Reviewed and stable  Last Vitals:  Vitals Value Taken Time  BP 103/43   Temp    Pulse 66   Resp 10   SpO2 100     Last Pain:  Vitals:   12/06/21 0641  TempSrc:   PainSc: 0-No pain         Complications: No notable events documented.

## 2021-12-06 NOTE — Anesthesia Procedure Notes (Addendum)
Procedure Name: LMA Insertion Date/Time: 12/06/2021 7:41 AM  Performed by: Barrington Ellison, CRNAPre-anesthesia Checklist: Patient identified, Emergency Drugs available, Suction available and Patient being monitored Patient Re-evaluated:Patient Re-evaluated prior to induction Oxygen Delivery Method: Circle System Utilized Preoxygenation: Pre-oxygenation with 100% oxygen Induction Type: IV induction Ventilation: Mask ventilation without difficulty LMA: LMA inserted LMA Size: 4.0 Number of attempts: 1 Placement Confirmation: positive ETCO2 Tube secured with: Tape Dental Injury: Teeth and Oropharynx as per pre-operative assessment

## 2021-12-06 NOTE — Interval H&P Note (Signed)
History and Physical Interval Note:  12/06/2021 7:06 AM  Isabel Foley  has presented today for surgery, with the diagnosis of menorrhagia.  The various methods of treatment have been discussed with the patient and family. After consideration of risks, benefits and other options for treatment, the patient has consented to  Procedure(s): [place] (N/A) as a surgical intervention.  The patient's history has been reviewed, patient examined, no change in status, stable for surgery.   She has been on lovenox and come off her eliquis for the procedure.  Her Hgb has improved to 9.8 s/p iron infusions.  I have reviewed the patient's chart and labs.  Questions were answered to the patient's satisfaction.     [name]

## 2021-12-06 NOTE — Anesthesia Postprocedure Evaluation (Signed)
Anesthesia Post Note  Patient: Isabel Foley  Procedure(s) Performed: DILATATION & CURETTAGE/HYSTEROSCOPY WITH [place] ABLATION     Patient location during evaluation: PACU Anesthesia Type: General Level of consciousness: awake and alert Pain management: pain level controlled Vital Signs Assessment: post-procedure vital signs reviewed and stable Respiratory status: spontaneous breathing, nonlabored ventilation, respiratory function stable and patient connected to nasal cannula oxygen Cardiovascular status: blood pressure returned to baseline and stable Postop Assessment: no apparent nausea or vomiting Anesthetic complications: no   No notable events documented.  Last Vitals:  Vitals:   12/06/21 0845 12/06/21 0900  BP: 124/84 101/89  Pulse: 93 82  Resp: 14 12  Temp:  36.4 C  SpO2: 100% 100%    Last Pain:  Vitals:   12/06/21 0900  TempSrc:   PainSc: 0-No pain                 Santa Lighter

## 2021-12-07 ENCOUNTER — Encounter (HOSPITAL_COMMUNITY): Payer: Self-pay | Admitting: Obstetrics and Gynecology

## 2021-12-10 LAB — SURGICAL PATHOLOGY

## 2021-12-24 ENCOUNTER — Encounter: Payer: Self-pay | Admitting: Internal Medicine

## 2021-12-26 ENCOUNTER — Other Ambulatory Visit: Payer: Self-pay | Admitting: Obstetrics and Gynecology

## 2021-12-26 DIAGNOSIS — Z1231 Encounter for screening mammogram for malignant neoplasm of breast: Secondary | ICD-10-CM

## 2022-01-03 ENCOUNTER — Encounter: Payer: Self-pay | Admitting: Family Medicine

## 2022-01-04 ENCOUNTER — Telehealth: Payer: 59 | Admitting: Physician Assistant

## 2022-01-04 DIAGNOSIS — U071 COVID-19: Secondary | ICD-10-CM | POA: Diagnosis not present

## 2022-01-04 MED ORDER — MOLNUPIRAVIR EUA 200MG CAPSULE: 4.0000 | ORAL_CAPSULE | Freq: Two times a day (BID) | ORAL | 0 refills | Status: AC

## 2022-01-04 NOTE — Progress Notes (Signed)
Virtual Visit Consent   Isabel Foley, you are scheduled for a virtual visit with a [facility] provider today. Just as with appointments in the office, your consent must be obtained to participate. Your consent will be active for this visit and any virtual visit you may have with one of our providers in the next 365 days. If you have a MyChart account, a copy of this consent can be sent to you electronically.  As this is a virtual visit, video technology does not allow for your provider to perform a traditional examination. This may limit your provider's ability to fully assess your condition. If your provider identifies any concerns that need to be evaluated in person or the need to arrange testing (such as labs, EKG, etc.), we will make arrangements to do so. Although advances in technology are sophisticated, we cannot ensure that it will always work on either your end or our end. If the connection with a video visit is poor, the visit may have to be switched to a telephone visit. With either a video or telephone visit, we are not always able to ensure that we have a secure connection.  By engaging in this virtual visit, you consent to the provision of healthcare and authorize for your insurance to be billed (if applicable) for the services provided during this visit. Depending on your insurance coverage, you may receive a charge related to this service.  I need to obtain your verbal consent now. Are you willing to proceed with your visit today? Isabel Foley has provided verbal consent on 01/04/2022 for a virtual visit (video or telephone). Mar Daring, PA-C  Date: 01/04/2022 4:36 PM  Virtual Visit via Video Note   I, Mar Daring, connected with  Isabel Foley  ([id number], [birth year]) on 01/04/22 at  4:15 PM EST by a video-enabled telemedicine application and verified that I am speaking with the correct person using two identifiers.  Location: Patient: Virtual Visit  Location Patient: Home Provider: Virtual Visit Location Provider: Home Office   I discussed the limitations of evaluation and management by telemedicine and the availability of in person appointments. The patient expressed understanding and agreed to proceed.    History of Present Illness: Isabel Foley is a 51 y.o. who identifies as a female who was assigned female at birth, and is being seen today for Covid 54.  HPI: URI  This is a new problem. The current episode started in the past 7 days (Tested positive for Covid 19 on at home test last night; symptoms started about 2 days ago). The problem has been gradually worsening. The maximum temperature recorded prior to her arrival was 103 - 104 F (103.1). The fever has been present for Less than 1 day. Associated symptoms include congestion, coughing, ear pain (intermittent), headaches, rhinorrhea, sinus pain and a sore throat (raw; red with white streaks). Pertinent negatives include no diarrhea, nausea, plugged ear sensation or vomiting. Associated symptoms comments: Chills, myalgias. She has tried acetaminophen (tylenol, coricidin hbp) for the symptoms. The treatment provided no relief.    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   Chronic pain in left shoulder 10/26/2019   PTSD (post-traumatic stress disorder) 06/25/2018   Acute non-recurrent maxillary sinusitis 02/07/2017   Chronic pain of left knee 07/09/2016   GAD (generalized anxiety disorder) 06/11/2016    Pruritus 10/20/2015   Chronic back pain 03/21/2015   Decreased hearing of both ears 11/22/2014   Episodic lightheadedness 10/04/2009   OTHER CHRONIC SINUSITIS 12/12/2008   History of hypothyroidism 09/08/2008   HYPERCHOLESTEROLEMIA 09/08/2008   Depression, major, 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   Allergic rhinitis 09/08/2008   GERD 09/08/2008   PEPTIC ULCER DISEASE 09/08/2008   Common migraine 09/08/2008    Allergies:  Allergies  Allergen Reactions   Molds & Smuts Shortness Of Breath   Medications:  Current Outpatient Medications:    molnupiravir EUA (LAGEVRIO) 200 mg CAPS capsule, Take 4 capsules (800 mg total) by mouth 2 (two) times daily for 5 days., Disp: 40 capsule, Rfl: 0   acetaminophen (TYLENOL) 500 MG tablet, Take 1,000 mg by mouth every 6 (six) hours as needed for moderate pain., Disp: , Rfl:    albuterol (VENTOLIN HFA) 108 (90 Base) MCG/ACT inhaler, Inhale 1-2 puffs into the lungs every 6 (six) hours as needed for wheezing or shortness of breath., Disp: , Rfl:    ALPRAZolam (XANAX) 0.5 MG tablet, Can use  1 tab daily  as needed for anxiety on long trips., Disp: 20 tablet, Rfl: 0   ARNUITY ELLIPTA 100 MCG/ACT AEPB, Inhale 1 puff into the lungs daily as needed (shortness of breath)., Disp: , Rfl: 5   aspirin 81 MG chewable tablet, Chew 81 mg by mouth daily., Disp: , Rfl:    azelastine (OPTIVAR) 0.05 % ophthalmic solution, Place 1 drop into both eyes 2 (two) times daily as needed (allergies)., Disp: , Rfl:    cetirizine (ZYRTEC) 10 MG tablet, Take 10 mg by mouth daily., Disp: , Rfl:    Cholecalciferol (DIALYVITE VITAMIN D 5000) 125 MCG (5000 UT) capsule, Take 5,000 Units by mouth daily., Disp: , Rfl:    COLLAGEN PO, Take 1 Scoop by mouth daily., Disp: , Rfl:    cyanocobalamin (VITAMIN B12) 1000 MCG tablet, Take 1,000 mcg by mouth daily., Disp: , Rfl:    diphenhydrAMINE (BENADRYL) 25 MG tablet, Take 25 mg by mouth at bedtime as needed for itching., Disp: , Rfl:    enoxaparin  (LOVENOX) 60 MG/0.6ML injection, Inject 60 mg into the skin See admin instructions. Inject 60 mg twice daily on 11/27 and 11/28, and 60 mg in the morning 11/29 then stop, Disp: , Rfl:    fexofenadine (ALLEGRA) 180 MG tablet, Take 180 mg by mouth daily as needed for allergies., Disp: , Rfl: 5   fluticasone (FLONASE) 50 MCG/ACT nasal spray, Place 2 sprays into both nostrils daily as needed for allergies., Disp: , Rfl:    hydrocortisone cream 1 %, Apply 1 Application topically 2 (two) times daily as needed for itching., Disp: , Rfl:    ipratropium (ATROVENT) 0.06 % nasal spray, Place 2 sprays into the nose daily., Disp: , Rfl:    montelukast (SINGULAIR) 10 MG tablet, Take 10 mg by mouth at bedtime., Disp: , Rfl:    Multiple Vitamin (MULTIVITAMIN) tablet, Take 1 tablet by mouth daily., Disp: , Rfl:    ondansetron (ZOFRAN-ODT) 4 MG disintegrating tablet, Take 1 tablet (4 mg total) by mouth every 8 (eight) hours as needed for nausea or vomiting., Disp:  20 tablet, Rfl: 0   oxyCODONE (ROXICODONE) 5 MG immediate release tablet, Take 1 tablet (5 mg total) by mouth every 4 (four) hours as needed for severe pain., Disp: 10 tablet, Rfl: 0   PREBIOTIC PRODUCT PO, Take 1 capsule by mouth daily., Disp: , Rfl:    Probiotic Product (PROBIOTIC FORMULA PO), Take 1 tablet by mouth daily., Disp: , Rfl:    Ubrogepant (UBRELVY) 100 MG TABS, Take 1 tablet by mouth daily as needed (For Migraines). Can repeat dose after 72 hours, Disp: , Rfl:   Observations/Objective: Patient is well-developed, well-nourished in no acute distress.  Resting comfortably at home.  Head is normocephalic, atraumatic.  No labored breathing.  Speech is clear and coherent with logical content.  Patient is alert and oriented at baseline.    Assessment and Plan: 1. COVID-19 - molnupiravir EUA (LAGEVRIO) 200 mg CAPS capsule; Take 4 capsules (800 mg total) by mouth 2 (two) times daily for 5 days.  Dispense: 40 capsule; Refill: 0  - Continue OTC  symptomatic management of choice - Will send OTC vitamins and supplement information through AVS - Molnupiravir prescribed - Patient enrolled in MyChart symptom monitoring - Push fluids - Rest as needed - Discussed return precautions and when to seek in-person evaluation, sent via AVS as well   Follow Up Instructions: I discussed the assessment and treatment plan with the patient. The patient was provided an opportunity to ask questions and all were answered. The patient agreed with the plan and demonstrated an understanding of the instructions.  A copy of instructions were sent to the patient via MyChart unless otherwise noted below.    The patient was advised to call back or seek an in-person evaluation if the symptoms worsen or if the condition fails to improve as anticipated.  Time:  I spent 18 minutes with the patient via telehealth technology discussing the above problems/concerns.    Mar Daring, PA-C

## 2022-01-04 NOTE — Patient Instructions (Signed)
Isabel Foley, thank you for joining Mar Daring, PA-C for today's virtual visit.  While this provider is not your primary care provider (PCP), if your PCP is located in our provider database this encounter information will be shared with them immediately following your visit.   [place] account gives you access to today's visit and all your visits, tests, and labs performed at [facility] " click here if you don't have a [facility] account or go to [email or web address]  Consent: (Patient) Isabel Foley provided verbal consent for this virtual visit at the beginning of the encounter.  Current Medications:  Current Outpatient Medications:    molnupiravir EUA (LAGEVRIO) 200 mg CAPS capsule, Take 4 capsules (800 mg total) by mouth 2 (two) times daily for 5 days., Disp: 40 capsule, Rfl: 0   acetaminophen (TYLENOL) 500 MG tablet, Take 1,000 mg by mouth every 6 (six) hours as needed for moderate pain., Disp: , Rfl:    albuterol (VENTOLIN HFA) 108 (90 Base) MCG/ACT inhaler, Inhale 1-2 puffs into the lungs every 6 (six) hours as needed for wheezing or shortness of breath., Disp: , Rfl:    ALPRAZolam (XANAX) 0.5 MG tablet, Can use  1 tab daily  as needed for anxiety on long trips., Disp: 20 tablet, Rfl: 0   ARNUITY ELLIPTA 100 MCG/ACT AEPB, Inhale 1 puff into the lungs daily as needed (shortness of breath)., Disp: , Rfl: 5   aspirin 81 MG chewable tablet, Chew 81 mg by mouth daily., Disp: , Rfl:    azelastine (OPTIVAR) 0.05 % ophthalmic solution, Place 1 drop into both eyes 2 (two) times daily as needed (allergies)., Disp: , Rfl:    cetirizine (ZYRTEC) 10 MG tablet, Take 10 mg by mouth daily., Disp: , Rfl:    Cholecalciferol (DIALYVITE VITAMIN D 5000) 125 MCG (5000 UT) capsule, Take 5,000 Units by mouth daily., Disp: , Rfl:    COLLAGEN PO, Take 1 Scoop by mouth daily., Disp: , Rfl:    cyanocobalamin (VITAMIN B12) 1000 MCG tablet, Take 1,000 mcg by mouth  daily., Disp: , Rfl:    diphenhydrAMINE (BENADRYL) 25 MG tablet, Take 25 mg by mouth at bedtime as needed for itching., Disp: , Rfl:    enoxaparin (LOVENOX) 60 MG/0.6ML injection, Inject 60 mg into the skin See admin instructions. Inject 60 mg twice daily on 11/27 and 11/28, and 60 mg in the morning 11/29 then stop, Disp: , Rfl:    fexofenadine (ALLEGRA) 180 MG tablet, Take 180 mg by mouth daily as needed for allergies., Disp: , Rfl: 5   fluticasone (FLONASE) 50 MCG/ACT nasal spray, Place 2 sprays into both nostrils daily as needed for allergies., Disp: , Rfl:    hydrocortisone cream 1 %, Apply 1 Application topically 2 (two) times daily as needed for itching., Disp: , Rfl:    ipratropium (ATROVENT) 0.06 % nasal spray, Place 2 sprays into the nose daily., Disp: , Rfl:    montelukast (SINGULAIR) 10 MG tablet, Take 10 mg by mouth at bedtime., Disp: , Rfl:    Multiple Vitamin (MULTIVITAMIN) tablet, Take 1 tablet by mouth daily., Disp: , Rfl:    ondansetron (ZOFRAN-ODT) 4 MG disintegrating tablet, Take 1 tablet (4 mg total) by mouth every 8 (eight) hours as needed for nausea or vomiting., Disp: 20 tablet, Rfl: 0   oxyCODONE (ROXICODONE) 5 MG immediate release tablet, Take 1 tablet (5 mg total) by mouth every 4 (four) hours as needed for severe pain., Disp: 10 tablet,  Rfl: 0   PREBIOTIC PRODUCT PO, Take 1 capsule by mouth daily., Disp: , Rfl:    Probiotic Product (PROBIOTIC FORMULA PO), Take 1 tablet by mouth daily., Disp: , Rfl:    Ubrogepant (UBRELVY) 100 MG TABS, Take 1 tablet by mouth daily as needed (For Migraines). Can repeat dose after 72 hours, Disp: , Rfl:    Medications ordered in this encounter:  Meds ordered this encounter  Medications   molnupiravir EUA (LAGEVRIO) 200 mg CAPS capsule    Sig: Take 4 capsules (800 mg total) by mouth 2 (two) times daily for 5 days.    Dispense:  40 capsule    Refill:  0    Order Specific Question:   Supervising Provider    Answer:   Chase Picket  [id number]     *If you need refills on other medications prior to your next appointment, please contact your pharmacy*  Follow-Up: Call back or seek an in-person evaluation if the symptoms worsen or if the condition fails to improve as anticipated.  [place] [phone]  Other Instructions  Can take to lessen severity: Vit C '500mg'$  twice daily Quercertin 250-'500mg'$  twice daily Zinc 75-'100mg'$  daily Melatonin 3-6 mg at bedtime Vit D3 1000-2000 IU daily Aspirin 81 mg daily with food Optional: Famotidine '20mg'$  daily Also can add tylenol/ibuprofen as needed for fevers and body aches May add Mucinex or Mucinex DM as needed for cough/congestion   COVID-19 COVID-19, or coronavirus disease 2019, is an infection that is caused by a new (novel) coronavirus called SARS-CoV-2. COVID-19 can cause many symptoms. In some people, the virus may not cause any symptoms. In others, it may cause mild or severe symptoms. Some people with severe infection develop severe disease. What are the causes? This illness is caused by a virus. The virus may be in the air as tiny specks of fluid (aerosols) or droplets, or it may be on surfaces. You may catch the virus by: Breathing in droplets from an infected person. Droplets can be spread by a person breathing, speaking, singing, coughing, or sneezing. Touching something, like a table or a doorknob, that has virus on it (is contaminated) and then touching your mouth, nose, or eyes. What increases the risk? Risk for infection: You are more likely to get infected with the COVID-19 virus if: You are within 6 ft (1.8 m) of a person with COVID-19 for 15 minutes or longer. You are providing care for a person who is infected with COVID-19. You are in close personal contact with other people. Close personal contact includes hugging, kissing, or sharing eating or drinking utensils. Risk for serious illness caused by COVID-19: You are more likely to get  seriously ill from the COVID-19 virus if: You have cancer. You have a long-term (chronic) disease, such as: Chronic lung disease. This includes pulmonary embolism, chronic obstructive pulmonary disease, and cystic fibrosis. Long-term disease that lowers your body's ability to fight infection (immunocompromise). Serious cardiac conditions, such as heart failure, coronary artery disease, or cardiomyopathy. Diabetes. Chronic kidney disease. Liver diseases. These include cirrhosis, nonalcoholic fatty liver disease, alcoholic liver disease, or autoimmune hepatitis. You have obesity. You are pregnant or were recently pregnant. You have sickle cell disease. What are the signs or symptoms? Symptoms of this condition can range from mild to severe. Symptoms may appear any time from 2 to 14 days after being exposed to the virus. They include: Fever or chills. Shortness of breath or trouble breathing. Feeling tired or  very tired. Headaches, body aches, or muscle aches. Runny or stuffy nose, sneezing, coughing, or sore throat. New loss of taste or smell. This is rare. Some people may also have stomach problems, such as nausea, vomiting, or diarrhea. Other people may not have any symptoms of COVID-19. How is this diagnosed? This condition may be diagnosed by testing samples to check for the COVID-19 virus. The most common tests are the PCR test and the antigen test. Tests may be done in the lab or at home. They include: Using a swab to take a sample of fluid from the back of your nose and throat (nasopharyngeal fluid), from your nose, or from your throat. Testing a sample of saliva from your mouth. Testing a sample of coughed-up mucus from your lungs (sputum). How is this treated? Treatment for COVID-19 infection depends on the severity of the condition. Mild symptoms can be managed at home with rest, fluids, and over-the-counter medicines. Serious symptoms may be treated in a hospital intensive care  unit (ICU). Treatment in the ICU may include: Supplemental oxygen. Extra oxygen is given through a tube in the nose, a face mask, or a hood. Medicines. These may include: Antivirals, such as monoclonal antibodies. These help your body fight off certain viruses that can cause disease. Anti-inflammatories, such as corticosteroids. These reduce inflammation and suppress the immune system. Antithrombotics. These prevent or treat blood clots, if they develop. Convalescent plasma. This helps boost your immune system, if you have an underlying immunosuppressive condition or are getting immunosuppressive treatments. Prone positioning. This means you will lie on your stomach. This helps oxygen to get into your lungs. Infection control measures. If you are at risk for more serious illness caused by COVID-19, your health care provider may prescribe two long-acting monoclonal antibodies, given together every 6 months. How is this prevented? To protect yourself: Use preventive medicine (pre-exposure prophylaxis). You may get pre-exposure prophylaxis if you have moderate or severe immunocompromise. Get vaccinated. Anyone 55 months old or older who meets guidelines can get a COVID-19 vaccine or vaccine series. This includes people who are pregnant or making breast milk (lactating). Get an added dose of COVID-19 vaccine after your first vaccine or vaccine series if you have moderate to severe immunocompromise. This applies if you have had a solid organ transplant or have been diagnosed with an immunocompromising condition. You should get the added dose 4 weeks after you got the first COVID-19 vaccine or vaccine series. If you get an mRNA vaccine, you will need a 3-dose primary series. If you get the J&J/Janssen vaccine, you will need a 2-dose primary series, with the second dose being an mRNA vaccine. Talk to your health care provider about getting experimental monoclonal antibodies. This treatment is approved  under emergency use authorization to prevent severe illness before or after being exposed to the COVID-19 virus. You may be given monoclonal antibodies if: You have moderate or severe immunocompromise. This includes treatments that lower your immune response. People with immunocompromise may not develop protection against COVID-19 when they are vaccinated. You cannot be vaccinated. You may not get a vaccine if you have a severe allergic reaction to the vaccine or its components. You are not fully vaccinated. You are in a facility where COVID-19 is present and: Are in close contact with a person who is infected with the COVID-19 virus. Are at high risk of being exposed to the COVID-19 virus. You are at risk of illness from new variants of the COVID-19 virus. To protect  others: If you have symptoms of COVID-19, take steps to prevent the virus from spreading to others. Stay home. Leave your house only to get medical care. Do not use public transit, if possible. Do not travel while you are sick. Wash your hands often with soap and water for at least 20 seconds. If soap and water are not available, use alcohol-based hand sanitizer. Make sure that all people in your household wash their hands well and often. Cough or sneeze into a tissue or your sleeve or elbow. Do not cough or sneeze into your hand or into the air. Where to find more information Centers for Disease Control and Prevention: CharmCourses.be World Health Organization: https://www.castaneda.info/ Get help right away if: You have trouble breathing. You have pain or pressure in your chest. You are confused. You have bluish lips and fingernails. You have trouble waking from sleep. You have symptoms that get worse. These symptoms may be an emergency. Get help right away. Call 911. Do not wait to see if the symptoms will go away. Do not drive yourself to the hospital. Summary COVID-19 is an infection that is caused by  a new coronavirus. Sometimes, there are no symptoms. Other times, symptoms range from mild to severe. Some people with a severe COVID-19 infection develop severe disease. The virus that causes COVID-19 can spread from person to person through droplets or aerosols from breathing, speaking, singing, coughing, or sneezing. Mild symptoms of COVID-19 can be managed at home with rest, fluids, and over-the-counter medicines. This information is not intended to replace advice given to you by your health care provider. Make sure you discuss any questions you have with your health care provider. Document Revised: 12/12/2020 Document Reviewed: 12/14/2020 Elsevier Patient Education  [place].    If you have been instructed to have an in-person evaluation today at a local Urgent Care facility, please use the link below. It will take you to a list of all of our available [facility], including address, phone number and hours of operation. Please do not delay care.  [facility]  If you or a family member do not have a primary care provider, use the link below to schedule a visit and establish care. When you choose a [facility] primary care physician or advanced practice provider, you gain a long-term partner in health. Find a Primary Care Provider  Learn more about [facility] in-office and virtual care options: [facility]

## 2022-01-16 ENCOUNTER — Ambulatory Visit
Admission: RE | Admit: 2022-01-16 | Discharge: 2022-01-16 | Disposition: A | Discharge status: Home / Self Care | Payer: 59 | Source: Ambulatory Visit | Attending: Obstetrics and Gynecology | Admitting: Obstetrics and Gynecology

## 2022-01-16 ENCOUNTER — Encounter: Payer: Self-pay | Admitting: Radiology

## 2022-01-16 DIAGNOSIS — Z1231 Encounter for screening mammogram for malignant neoplasm of breast: Secondary | ICD-10-CM

## 2022-01-28 ENCOUNTER — Inpatient Hospital Stay: Payer: 59 | Attending: Internal Medicine

## 2022-01-28 ENCOUNTER — Inpatient Hospital Stay (HOSPITAL_BASED_OUTPATIENT_CLINIC_OR_DEPARTMENT_OTHER): Payer: 59 | Admitting: Internal Medicine

## 2022-01-28 VITALS — BP 128/80 | HR 69 | Temp 97.8°F | Wt 115.6 lb

## 2022-01-28 DIAGNOSIS — D500 Iron deficiency anemia secondary to blood loss (chronic): Secondary | ICD-10-CM | POA: Diagnosis not present

## 2022-01-28 DIAGNOSIS — Z7901 Long term (current) use of anticoagulants: Secondary | ICD-10-CM | POA: Insufficient documentation

## 2022-01-28 DIAGNOSIS — D509 Iron deficiency anemia, unspecified: Secondary | ICD-10-CM | POA: Insufficient documentation

## 2022-01-28 DIAGNOSIS — Z803 Family history of malignant neoplasm of breast: Secondary | ICD-10-CM | POA: Insufficient documentation

## 2022-01-28 DIAGNOSIS — N920 Excessive and frequent menstruation with regular cycle: Secondary | ICD-10-CM | POA: Diagnosis not present

## 2022-01-28 DIAGNOSIS — R42 Dizziness and giddiness: Secondary | ICD-10-CM | POA: Insufficient documentation

## 2022-01-28 DIAGNOSIS — N921 Excessive and frequent menstruation with irregular cycle: Secondary | ICD-10-CM

## 2022-01-28 LAB — CBC WITH DIFFERENTIAL/PLATELET
Abs Immature Granulocytes: 0.02 10*3/uL (ref 0.00–0.07)
Basophils Absolute: 0 10*3/uL (ref 0.0–0.1)
Basophils Relative: 1 %
Eosinophils Absolute: 0.1 10*3/uL (ref 0.0–0.5)
Eosinophils Relative: 1 %
HCT: 37.1 % (ref 36.0–46.0)
Hemoglobin: 12.6 g/dL (ref 12.0–15.0)
Immature Granulocytes: 0 %
Lymphocytes Relative: 17 %
Lymphs Abs: 1.4 10*3/uL (ref 0.7–4.0)
MCH: 29.6 pg (ref 26.0–34.0)
MCHC: 34 g/dL (ref 30.0–36.0)
MCV: 87.1 fL (ref 80.0–100.0)
Monocytes Absolute: 0.6 10*3/uL (ref 0.1–1.0)
Monocytes Relative: 8 %
Neutro Abs: 5.8 10*3/uL (ref 1.7–7.7)
Neutrophils Relative %: 73 %
Platelets: 274 10*3/uL (ref 150–400)
RBC: 4.26 MIL/uL (ref 3.87–5.11)
RDW: 20.5 % — ABNORMAL HIGH (ref 11.5–15.5)
WBC: 7.9 10*3/uL (ref 4.0–10.5)
nRBC: 0 % (ref 0.0–0.2)

## 2022-01-28 LAB — IRON AND TIBC
Iron: 126 ug/dL (ref 28–170)
Saturation Ratios: 35 % — ABNORMAL HIGH (ref 10.4–31.8)
TIBC: 357 ug/dL (ref 250–450)
UIBC: 231 ug/dL

## 2022-01-28 LAB — FERRITIN: Ferritin: 32 ng/mL (ref 11–307)

## 2022-01-28 LAB — VITAMIN B12: Vitamin B-12: 1966 pg/mL — ABNORMAL HIGH (ref 180–914)

## 2022-01-28 NOTE — Progress Notes (Signed)
[facility]  Telephone:([phone] [phone]  ID: Isabel Foley OB: [birth year]  MR#: [id number]  GHW#:[id number]  Patient Care Team: Jinny Sanders, MD as PCP - General  REFERRING PROVIDER: Dr. Eliezer Lofts  REASON FOR REFERRAL: IDA  HPI: Isabel Foley is a 52 y.o. female with past medical history of gallstones, hypothyroidism, hyperlipidemia and fibromuscular dysplasia causing SMA dissection follows at [facility] was referred to hematology for management of iron deficiency anemia.  Patient reports history of heavy menses.  Since she had SMA dissection she was placed on triple therapy with aspirin, Plavix and Eliquis in February 2023.  Now she is on aspirin and Eliquis only.  Her periods have gotten heavier with blood thinners.  Denies any bleeding in stools or urine.  Her stool occult was negative. Most recent labs showed hemoglobin of 8.8, ferritin 2 and saturation 3.8%.  She is scheduled for uterine ablation procedure on November 30.  Reports dizziness and shortness of breath on exertion.  She has weight loss due to limitations in what she can eat with her dissection.  Energy level is low.  Feels cold.  Interval history- Patient had a uterine ablation procedure done recently. Menstrual bleeding has significantly improved.  She reported episode of dizziness which lasted about 30 minutes to 1 hour around the Christmas time.  At that time, she was also sick with COVID infection.  Since it has not recurred. Denies any black tarry stools or bleeding in the urine.  She has been following with vascular and cardiology for her fibromuscular dysplasia.  Plan for CT imaging head to toe around April.  She received 5 doses of IV Venofer and tolerated well.  Reports her energy level is improving.  She feels like she is going back to her normal.  REVIEW OF SYSTEMS:   Review of Systems  Neurological:  Positive for dizziness.    As per HPI. Otherwise, a complete review of  systems is negative.  PAST MEDICAL HISTORY: Past Medical History:  Diagnosis Date   Abdominal pain, right upper quadrant    Allergic rhinitis, cause unspecified    Allergy-induced asthma    Anemia    Aneurysm of right renal artery (HCC)    Anxiety    Arthritis    Depressive disorder, not elsewhere classified    Dizziness and giddiness    Esophageal reflux    Fibromuscular dysplasia (HCC)    Gallstones    Headache    Liver lesion    Nontoxic multinodular goiter    Other chronic sinusitis    Other malaise and fatigue    Pain in limb    Peptic ulcer, unspecified site, unspecified as acute or chronic, without mention of hemorrhage, perforation, or obstruction    Premenstrual tension syndromes    Pure hypercholesterolemia    Unspecified hypothyroidism     PAST SURGICAL HISTORY: Past Surgical History:  Procedure Laterality Date   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     with 2 stents   BREAST BIOPSY Right 06/01/2015   stereo, path pending   DILATION AND CURETTAGE OF UTERUS  01/08/2000   DILITATION & CURRETTAGE/HYSTROSCOPY WITH NOVASURE ABLATION N/A 12/06/2021   Procedure: DILATATION & CURETTAGE/HYSTEROSCOPY WITH NOVASURE ABLATION;  Surgeon: Paula Compton, MD;  Location: [facility];  Service: Gynecology;  Laterality: N/A;   LEEP  01/07/1998    FAMILY HISTORY: Family History  Problem Relation Age of Onset   Hyperthyroidism Mother    Irritable bowel syndrome Mother  Colon polyps Father    Hypertension Father    Hypothyroidism Father    Other Sister        Collagen Vascular Disease   Heart disease Brother    Atrial fibrillation Brother 52   Breast cancer Cousin     HEALTH MAINTENANCE: Social History   Tobacco Use   Smoking status: Never   Smokeless tobacco: Never  Vaping Use   Vaping Use: Never used  Substance Use Topics   Alcohol use: No   Drug use: No     Allergies  Allergen Reactions   Molds & Smuts Shortness Of Breath    Current  Outpatient Medications  Medication Sig Dispense Refill   acetaminophen (TYLENOL) 500 MG tablet Take 1,000 mg by mouth every 6 (six) hours as needed for moderate pain.     albuterol (VENTOLIN HFA) 108 (90 Base) MCG/ACT inhaler Inhale 1-2 puffs into the lungs every 6 (six) hours as needed for wheezing or shortness of breath.     ALPRAZolam (XANAX) 0.5 MG tablet Can use  1 tab daily  as needed for anxiety on long trips. 20 tablet 0   ARNUITY ELLIPTA 100 MCG/ACT AEPB Inhale 1 puff into the lungs daily as needed (shortness of breath).  5   aspirin 81 MG chewable tablet Chew 81 mg by mouth daily.     azelastine (OPTIVAR) 0.05 % ophthalmic solution Place 1 drop into both eyes 2 (two) times daily as needed (allergies).     cetirizine (ZYRTEC) 10 MG tablet Take 10 mg by mouth daily.     Cholecalciferol (DIALYVITE VITAMIN D 5000) 125 MCG (5000 UT) capsule Take 5,000 Units by mouth daily.     COLLAGEN PO Take 1 Scoop by mouth daily.     cyanocobalamin (VITAMIN B12) 1000 MCG tablet Take 1,000 mcg by mouth daily.     diphenhydrAMINE (BENADRYL) 25 MG tablet Take 25 mg by mouth at bedtime as needed for itching.     enoxaparin (LOVENOX) 60 MG/0.6ML injection Inject 60 mg into the skin See admin instructions. Inject 60 mg twice daily on 11/27 and 11/28, and 60 mg in the morning 11/29 then stop     fexofenadine (ALLEGRA) 180 MG tablet Take 180 mg by mouth daily as needed for allergies.  5   fluticasone (FLONASE) 50 MCG/ACT nasal spray Place 2 sprays into both nostrils daily as needed for allergies.     hydrocortisone cream 1 % Apply 1 Application topically 2 (two) times daily as needed for itching.     ipratropium (ATROVENT) 0.06 % nasal spray Place 2 sprays into the nose daily.     montelukast (SINGULAIR) 10 MG tablet Take 10 mg by mouth at bedtime.     Multiple Vitamin (MULTIVITAMIN) tablet Take 1 tablet by mouth daily.     ondansetron (ZOFRAN-ODT) 4 MG disintegrating tablet Take 1 tablet (4 mg total) by mouth  every 8 (eight) hours as needed for nausea or vomiting. 20 tablet 0   oxyCODONE (ROXICODONE) 5 MG immediate release tablet Take 1 tablet (5 mg total) by mouth every 4 (four) hours as needed for severe pain. 10 tablet 0   PREBIOTIC PRODUCT PO Take 1 capsule by mouth daily.     Probiotic Product (PROBIOTIC FORMULA PO) Take 1 tablet by mouth daily.     Ubrogepant (UBRELVY) 100 MG TABS Take 1 tablet by mouth daily as needed (For Migraines). Can repeat dose after 72 hours     No current facility-administered medications for this  visit.    OBJECTIVE: Vitals:   01/28/22 1333  BP: 128/80  Pulse: 69  Temp: 97.8 F (36.6 C)     Body mass index is 21.84 kg/m.      Physical exam not performed   LAB RESULTS:  Lab Results  Component Value Date   NA 138 12/06/2021   K 3.6 12/06/2021   CL 108 12/06/2021   CO2 25 12/06/2021   GLUCOSE 73 12/06/2021   BUN 12 12/06/2021   CREATININE 0.72 12/06/2021   CALCIUM 8.6 (L) 12/06/2021   PROT 7.0 05/29/2021   ALBUMIN 4.0 05/29/2021   AST 27 05/29/2021   ALT 30 05/29/2021   ALKPHOS 60 05/29/2021   BILITOT 0.4 05/29/2021   GFRNONAA >60 12/06/2021   GFRAA 98 08/05/2019    Lab Results  Component Value Date   WBC 7.9 01/28/2022   NEUTROABS 5.8 01/28/2022   HGB 12.6 01/28/2022   HCT 37.1 01/28/2022   MCV 87.1 01/28/2022   PLT 274 01/28/2022    Lab Results  Component Value Date   TIBC 443.8 11/13/2021   TIBC 399.0 05/29/2021   FERRITIN 2.3 (L) 11/13/2021   FERRITIN 5.8 (L) 05/29/2021   IRONPCTSAT 3.8 (L) 11/13/2021   IRONPCTSAT 9.0 (L) 05/29/2021     STUDIES: MM 3D SCREEN BREAST BILATERAL  Result Date: 01/17/2022 CLINICAL DATA:  Screening. EXAM: DIGITAL SCREENING BILATERAL MAMMOGRAM WITH TOMOSYNTHESIS AND CAD TECHNIQUE: Bilateral screening digital craniocaudal and mediolateral oblique mammograms were obtained. Bilateral screening digital breast tomosynthesis was performed. The images were evaluated with computer-aided detection.  COMPARISON:  Previous exam(s). ACR Breast Density Category d: The breast tissue is extremely dense, which lowers the sensitivity of mammography FINDINGS: There are no findings suspicious for malignancy. IMPRESSION: No mammographic evidence of malignancy. A result letter of this screening mammogram will be mailed directly to the patient. RECOMMENDATION: Screening mammogram in one year. (Code:SM-B-01Y) BI-RADS CATEGORY  1: Negative. Electronically Signed   By: Franki Cabot M.D.   On: 01/17/2022 08:17    ASSESSMENT AND PLAN:   Isabel Foley is a 52 y.o. female with pmh of of gallstones, hypothyroidism, hyperlipidemia and fibromuscular dysplasia causing SMA dissection follows at [facility] was referred to hematology for management of iron deficiency anemia.  #Iron Deficiency anemia -Likely secondary to heavy menstrual period worsened with blood thinners.  -Hemoglobin 8.8, ferritin 2 and saturation 3.8%.  Treated with IV Venofer x 5 doses.  Repeat hemoglobin showed good response.  Hemoglobin today 12.6.  Iron panel is pending.  She underwent uterine ablation procedure on 12/06/2021 due to heavy menstrual period.  Since then her bleeding has significantly improved.  I would like to recheck her hemoglobin and iron panel in 5 months time to ensure for stability.  If her iron levels drop, will consider GI referral.  # Fibromuscular dysplasia # History of SMA dissection -Her FMD specialist at [facility].  She follows with vascular and cardiology also.  Reports that she is planned for for CT head due to around April.  Orders Placed This Encounter  Procedures   CBC with Differential/Platelet   Iron and TIBC   Ferritin    RTC in 5 months for MD visit, labs.  Patient expressed understanding and was in agreement with this plan. She also understands that She can call clinic at any time with any questions, concerns, or complaints.   I spent a total of 30 minutes reviewing chart data, face-to-face evaluation with the  patient, counseling and coordination of care as detailed above.  Jane Canary, MD   01/28/2022 2:44 PM

## 2022-01-28 NOTE — Progress Notes (Signed)
No complaints today, bleeding has improved since gyn procedure.

## 2022-01-29 ENCOUNTER — Encounter: Payer: Self-pay | Admitting: Internal Medicine

## 2022-02-19 ENCOUNTER — Encounter: Payer: Self-pay | Admitting: Family Medicine

## 2022-02-19 NOTE — Telephone Encounter (Signed)
Noted  

## 2022-02-19 NOTE — Telephone Encounter (Signed)
I spoke with pt; pt said for a few days pt has noticed some dizziness when she first wakes up in the mornings. Last night pt said when she stood pt felt like she was leaning to the lt and pts husband confirmed that pt appeared to be leaning to the left; it was not just a feeling. Pt said her legs felt weak or heavy and her mind was foggy. Pt said this morning she stayed at home until she felt OK. Now pt said she feels OK but still feels some weakness or heaviness in the lt leg. Last night pt said she did have some problem with walking due to lt leg.pt said no upper extremity issues. Pt said no H/A, CP or SOB. Pt said this time of year due to allergies pt has some blurred vision so that is not new or unexpected. 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 Pt said she does not feel bad now and does not want to schedule with different provider at another office and does not want to go to [facility] or ED. Pt wants next available appt with Dr Diona Browner. Pt scheduled appt with Dr Diona Browner on 02/20/22 at 3:40 with UC & ED precautions which pt voiced understanding. Sending note to Dr Bascom Levels pool and will teams Butch Penny CMA who requested triage.

## 2022-02-20 ENCOUNTER — Ambulatory Visit: Payer: 59 | Admitting: Family Medicine

## 2022-02-20 ENCOUNTER — Encounter: Payer: Self-pay | Admitting: Family Medicine

## 2022-02-20 VITALS — BP 102/64 | HR 71 | Temp 98.2°F | Ht 61.0 in | Wt 115.0 lb

## 2022-02-20 DIAGNOSIS — R42 Dizziness and giddiness: Secondary | ICD-10-CM

## 2022-02-20 NOTE — Assessment & Plan Note (Signed)
Acute, symptoms could be associated with middle ear fluid from allergies seen on ear exam. Given her complicated past medical history including iron deficiency anemia and Hashimoto's thyroiditis we will check for recurrent anemia with CBC and ferritin as well as thyroid testing. Neuro exam is normal in office today, no red flags for stroke or intracranial bleed.  Return and ER precautions provided.

## 2022-02-20 NOTE — Progress Notes (Signed)
Patient ID: Isabel Foley, female    DOB: 08-28-1970, 52 y.o.   MRN: [id number]  This visit was conducted in person.  BP 102/64 (BP Location: Left Arm, Patient Position: Sitting, Cuff Size: Normal)   Pulse 71   Temp 98.2 F (36.8 C) (Temporal)   Ht 5' 1"$  (1.549 m)   Wt 115 lb (52.2 kg)   SpO2 98%   BMI 21.73 kg/m    CC:  Chief Complaint  Patient presents with   Extremity Weakness   Dizziness    Mostly happens when she has been in a down position or wakes up from sleeping.     Subjective:   HPI: Isabel Foley is a 52 y.o. female with history of mesenterica artery dissection and fibromuscular dysplasia presenting on 02/20/2022 for Extremity Weakness and Dizziness (Mostly happens when she has been in a down position or wakes up from sleeping. )   She is on ASA and ELiquis anticoagulation. She has noted new onset     Anemia followed by Dr. Darrall Dears.. reviewed last OV 01/2022 .Marland Kitchen Given heavy menses Per his note: Patient had a uterine ablation procedure done recently. Menstrual bleeding has significantly improved.  She reported episode of dizziness which lasted about 30 minutes to 1 hour around the Christmas time.  At that time, she was also sick with COVID infection.  Since it has not recurred. Denies any black tarry stools or bleeding in the urine.  She has been following with vascular and cardiology for her fibromuscular dysplasia.  Plan for CT imaging head to toe around April.  She received 5 doses of IV Venofer and tolerated well.  Reports her energy level is improving.   Hg at that check was up from 8.8 at 12.6, ferritin nml 32.  Today she reports 2 nights ago while watching TV... legs felt weak and she felt she was leaning toward left.. felt brain fog. Went to sleep but lasted few hours in AM.  No slurred speech, no numbness. No SOB, no chest pain. No abdominal pain.  BP was 122/79  HR 67  No head ache.  No proceeding falls.   Minimal menses  each month now.  HAs allergy  symptoms. No face pain or fear pain.   Had Sylva in 12/2021.Marland Kitchen Had resulting vertigo.  Relevant past medical, surgical, family and social history reviewed and updated as indicated. Interim medical history since our last visit reviewed. Allergies and medications reviewed and updated. Outpatient Medications Prior to Visit  Medication Sig Dispense Refill   acetaminophen (TYLENOL) 500 MG tablet Take 1,000 mg by mouth every 6 (six) hours as needed for moderate pain.     albuterol (VENTOLIN HFA) 108 (90 Base) MCG/ACT inhaler Inhale 1-2 puffs into the lungs every 6 (six) hours as needed for wheezing or shortness of breath.     ALPRAZolam (XANAX) 0.5 MG tablet Can use  1 tab daily  as needed for anxiety on long trips. 20 tablet 0   apixaban (ELIQUIS) 5 MG TABS tablet Take 5 mg by mouth 2 (two) times daily.     ARNUITY ELLIPTA 100 MCG/ACT AEPB Inhale 1 puff into the lungs daily as needed (shortness of breath).  5   aspirin 81 MG chewable tablet Chew 81 mg by mouth daily.     azelastine (OPTIVAR) 0.05 % ophthalmic solution Place 1 drop into both eyes 2 (two) times daily as needed (allergies).     cetirizine (ZYRTEC) 10 MG tablet Take 10 mg by mouth  daily.     Cholecalciferol (DIALYVITE VITAMIN D 5000) 125 MCG (5000 UT) capsule Take 5,000 Units by mouth daily.     COLLAGEN PO Take 1 Scoop by mouth daily.     diphenhydrAMINE (BENADRYL) 25 MG tablet Take 25 mg by mouth at bedtime as needed for itching.     fexofenadine (ALLEGRA) 180 MG tablet Take 180 mg by mouth daily as needed for allergies.  5   fluticasone (FLONASE) 50 MCG/ACT nasal spray Place 2 sprays into both nostrils daily as needed for allergies.     hydrocortisone cream 1 % Apply 1 Application topically 2 (two) times daily as needed for itching.     ipratropium (ATROVENT) 0.06 % nasal spray Place 2 sprays into the nose daily.     montelukast (SINGULAIR) 10 MG tablet Take 10 mg by mouth at bedtime.     Multiple Vitamin (MULTIVITAMIN) tablet Take  1 tablet by mouth daily.     ondansetron (ZOFRAN-ODT) 4 MG disintegrating tablet Take 1 tablet (4 mg total) by mouth every 8 (eight) hours as needed for nausea or vomiting. 20 tablet 0   oxyCODONE (ROXICODONE) 5 MG immediate release tablet Take 1 tablet (5 mg total) by mouth every 4 (four) hours as needed for severe pain. 10 tablet 0   PREBIOTIC PRODUCT PO Take 1 capsule by mouth daily.     Probiotic Product (PROBIOTIC FORMULA PO) Take 1 tablet by mouth daily.     Ubrogepant (UBRELVY) 100 MG TABS Take 1 tablet by mouth daily as needed (For Migraines). Can repeat dose after 72 hours     cyanocobalamin (VITAMIN B12) 1000 MCG tablet Take 1,000 mcg by mouth daily.     enoxaparin (LOVENOX) 60 MG/0.6ML injection Inject 60 mg into the skin See admin instructions. Inject 60 mg twice daily on 11/27 and 11/28, and 60 mg in the morning 11/29 then stop     No facility-administered medications prior to visit.     Per HPI unless specifically indicated in ROS section below Review of Systems  Constitutional:  Negative for fatigue and fever.  HENT:  Negative for congestion.   Eyes:  Negative for pain.  Respiratory:  Negative for cough and shortness of breath.   Cardiovascular:  Negative for chest pain, palpitations and leg swelling.  Gastrointestinal:  Negative for abdominal pain.  Genitourinary:  Negative for dysuria and vaginal bleeding.  Musculoskeletal:  Negative for back pain.  Neurological:  Positive for dizziness. Negative for syncope, light-headedness and headaches.  Psychiatric/Behavioral:  Negative for dysphoric mood.    Objective:  BP 102/64 (BP Location: Left Arm, Patient Position: Sitting, Cuff Size: Normal)   Pulse 71   Temp 98.2 F (36.8 C) (Temporal)   Ht 5' 1"$  (1.549 m)   Wt 115 lb (52.2 kg)   SpO2 98%   BMI 21.73 kg/m   Wt Readings from Last 3 Encounters:  02/20/22 115 lb (52.2 kg)  01/28/22 115 lb 9.6 oz (52.4 kg)  12/06/21 111 lb (50.3 kg)      Physical  Exam Constitutional:      General: She is not in acute distress.    Appearance: Normal appearance. She is well-developed. She is not ill-appearing or toxic-appearing.  HENT:     Head: Normocephalic.     Right Ear: Hearing, ear canal and external ear normal. A middle ear effusion is present. Tympanic membrane is not erythematous, retracted or bulging.     Left Ear: Hearing, ear canal and external ear normal. Tympanic  membrane is scarred. Tympanic membrane is not erythematous, retracted or bulging.     Nose: No mucosal edema or rhinorrhea.     Right Sinus: No maxillary sinus tenderness or frontal sinus tenderness.     Left Sinus: No maxillary sinus tenderness or frontal sinus tenderness.     Mouth/Throat:     Pharynx: Uvula midline.  Eyes:     General: Lids are normal. Lids are everted, no foreign bodies appreciated.     Conjunctiva/sclera: Conjunctivae normal.     Pupils: Pupils are equal, round, and reactive to light.  Neck:     Thyroid: No thyroid mass or thyromegaly.     Vascular: No carotid bruit.     Trachea: Trachea normal.  Cardiovascular:     Rate and Rhythm: Normal rate and regular rhythm.     Pulses: Normal pulses.     Heart sounds: Normal heart sounds, S1 normal and S2 normal. No murmur heard.    No friction rub. No gallop.  Pulmonary:     Effort: Pulmonary effort is normal. No tachypnea or respiratory distress.     Breath sounds: Normal breath sounds. No decreased breath sounds, wheezing, rhonchi or rales.  Abdominal:     General: Bowel sounds are normal.     Palpations: Abdomen is soft.     Tenderness: There is no abdominal tenderness.  Musculoskeletal:     Cervical back: Normal range of motion and neck supple.  Skin:    General: Skin is warm and dry.     Findings: No rash.  Neurological:     Mental Status: She is alert and oriented to person, place, and time.     GCS: GCS eye subscore is 4. GCS verbal subscore is 5. GCS motor subscore is 6.     Cranial Nerves: No  cranial nerve deficit.     Sensory: No sensory deficit.     Motor: No abnormal muscle tone.     Coordination: Coordination normal.     Gait: Gait normal.     Deep Tendon Reflexes: Reflexes are normal and symmetric.     Comments: Nml cerebellar exam   No papilledema  Psychiatric:        Mood and Affect: Mood is not anxious or depressed.        Speech: Speech normal.        Behavior: Behavior normal. Behavior is cooperative.        Thought Content: Thought content normal.        Cognition and Memory: Memory is not impaired. She does not exhibit impaired recent memory or impaired remote memory.        Judgment: Judgment normal.       Results for orders placed or performed in visit on 01/28/22  Vitamin B12  Result Value Ref Range   Vitamin B-12 1,966 (H) 180 - 914 pg/mL  CBC with Differential  Result Value Ref Range   WBC 7.9 4.0 - 10.5 K/uL   RBC 4.26 3.87 - 5.11 MIL/uL   Hemoglobin 12.6 12.0 - 15.0 g/dL   HCT 37.1 36.0 - 46.0 %   MCV 87.1 80.0 - 100.0 fL   MCH 29.6 26.0 - 34.0 pg   MCHC 34.0 30.0 - 36.0 g/dL   RDW 20.5 (H) 11.5 - 15.5 %   Platelets 274 150 - 400 K/uL   nRBC 0.0 0.0 - 0.2 %   Neutrophils Relative % 73 %   Neutro Abs 5.8 1.7 - 7.7 K/uL  Lymphocytes Relative 17 %   Lymphs Abs 1.4 0.7 - 4.0 K/uL   Monocytes Relative 8 %   Monocytes Absolute 0.6 0.1 - 1.0 K/uL   Eosinophils Relative 1 %   Eosinophils Absolute 0.1 0.0 - 0.5 K/uL   Basophils Relative 1 %   Basophils Absolute 0.0 0.0 - 0.1 K/uL   Immature Granulocytes 0 %   Abs Immature Granulocytes 0.02 0.00 - 0.07 K/uL  Ferritin  Result Value Ref Range   Ferritin 32 11 - 307 ng/mL  Iron and TIBC([facility])  Result Value Ref Range   Iron 126 28 - 170 ug/dL   TIBC 357 250 - 450 ug/dL   Saturation Ratios 35 (H) 10.4 - 31.8 %   UIBC 231 ug/dL    Assessment and Plan  Dizziness Assessment & Plan: Acute, symptoms could be associated with middle ear fluid from allergies seen on ear exam. Given  her complicated past medical history including iron deficiency anemia and Hashimoto's thyroiditis we will check for recurrent anemia with CBC and ferritin as well as thyroid testing. Neuro exam is normal in office today, no red flags for stroke or intracranial bleed.  Return and ER precautions provided.  Orders: -     CBC with Differential/Platelet -     IBC + Ferritin -     TSH -     T4, free -     T3, free    No follow-ups on file.   Eliezer Lofts, MD

## 2022-02-20 NOTE — Patient Instructions (Addendum)
Please stop at the lab to have labs drawn. .Start  flonase 2 sprays per nostril daily.

## 2022-02-21 ENCOUNTER — Encounter: Payer: Self-pay | Admitting: Internal Medicine

## 2022-02-21 ENCOUNTER — Encounter: Payer: Self-pay | Admitting: Family Medicine

## 2022-02-21 LAB — CBC WITH DIFFERENTIAL/PLATELET
Basophils Absolute: 0.1 10*3/uL (ref 0.0–0.1)
Basophils Relative: 0.9 % (ref 0.0–3.0)
Eosinophils Absolute: 0.1 10*3/uL (ref 0.0–0.7)
Eosinophils Relative: 1.6 % (ref 0.0–5.0)
HCT: 38.5 % (ref 36.0–46.0)
Hemoglobin: 13.1 g/dL (ref 12.0–15.0)
Lymphocytes Relative: 22.9 % (ref 12.0–46.0)
Lymphs Abs: 1.7 10*3/uL (ref 0.7–4.0)
MCHC: 33.9 g/dL (ref 30.0–36.0)
MCV: 90.9 fl (ref 78.0–100.0)
Monocytes Absolute: 0.7 10*3/uL (ref 0.1–1.0)
Monocytes Relative: 10 % (ref 3.0–12.0)
Neutro Abs: 4.7 10*3/uL (ref 1.4–7.7)
Neutrophils Relative %: 64.6 % (ref 43.0–77.0)
Platelets: 264 10*3/uL (ref 150.0–400.0)
RBC: 4.23 Mil/uL (ref 3.87–5.11)
RDW: 19 % — ABNORMAL HIGH (ref 11.5–15.5)
WBC: 7.3 10*3/uL (ref 4.0–10.5)

## 2022-02-21 LAB — T3, FREE: T3, Free: 3 pg/mL (ref 2.3–4.2)

## 2022-02-21 LAB — TSH: TSH: 1.97 u[IU]/mL (ref 0.35–5.50)

## 2022-02-21 LAB — IBC + FERRITIN
Ferritin: 18 ng/mL (ref 10.0–291.0)
Iron: 73 ug/dL (ref 42–145)
Saturation Ratios: 20.7 % (ref 20.0–50.0)
TIBC: 352.8 ug/dL (ref 250.0–450.0)
Transferrin: 252 mg/dL (ref 212.0–360.0)

## 2022-02-21 LAB — T4, FREE: Free T4: 0.95 ng/dL (ref 0.60–1.60)

## 2022-02-25 ENCOUNTER — Inpatient Hospital Stay: Payer: 59

## 2022-02-25 ENCOUNTER — Other Ambulatory Visit: Payer: 59

## 2022-02-25 ENCOUNTER — Inpatient Hospital Stay: Payer: 59 | Attending: Internal Medicine | Admitting: Medical Oncology

## 2022-02-25 ENCOUNTER — Encounter: Payer: Self-pay | Admitting: Medical Oncology

## 2022-02-25 ENCOUNTER — Encounter: Payer: 59 | Admitting: Medical Oncology

## 2022-02-25 VITALS — BP 115/89 | HR 75 | Temp 97.8°F | Resp 20 | Wt 117.3 lb

## 2022-02-25 DIAGNOSIS — D500 Iron deficiency anemia secondary to blood loss (chronic): Secondary | ICD-10-CM | POA: Diagnosis not present

## 2022-02-25 DIAGNOSIS — I773 Arterial fibromuscular dysplasia: Secondary | ICD-10-CM

## 2022-02-25 DIAGNOSIS — I7779 Dissection of other artery: Secondary | ICD-10-CM

## 2022-02-25 DIAGNOSIS — D509 Iron deficiency anemia, unspecified: Secondary | ICD-10-CM | POA: Diagnosis present

## 2022-02-25 DIAGNOSIS — R0602 Shortness of breath: Secondary | ICD-10-CM | POA: Insufficient documentation

## 2022-02-25 DIAGNOSIS — N921 Excessive and frequent menstruation with irregular cycle: Secondary | ICD-10-CM

## 2022-02-25 DIAGNOSIS — Z803 Family history of malignant neoplasm of breast: Secondary | ICD-10-CM | POA: Diagnosis not present

## 2022-02-25 DIAGNOSIS — R42 Dizziness and giddiness: Secondary | ICD-10-CM | POA: Diagnosis not present

## 2022-02-25 DIAGNOSIS — I722 Aneurysm of renal artery: Secondary | ICD-10-CM

## 2022-02-25 NOTE — Progress Notes (Signed)
Patient states she seen her GP last week and they did some blood work and it showed that her iron had dropped within 3 weeks.

## 2022-02-25 NOTE — Progress Notes (Signed)
[facility]  Telephone:([phone] [phone]  ID: Isabel Foley OB: Mar 24, 1970  MR#: [id number]  [id number]  Patient Care Team: Jinny Sanders, MD as PCP - General  REFERRING PROVIDER: Dr. Eliezer Lofts  REASON FOR REFERRAL: IDA  HPI: Isabel Foley is a 52 y.o. female with past medical history of gallstones, hypothyroidism, hyperlipidemia and fibromuscular dysplasia causing SMA dissection follows at [facility] was referred to hematology for management of iron deficiency anemia.  Patient reports history of heavy menses.  Since she had SMA dissection she was placed on triple therapy with aspirin, Plavix and Eliquis in February 2023.  Now she is on aspirin and Eliquis only.  Her periods have gotten heavier with blood thinners.  Denies any bleeding in stools or urine.  Her stool occult was negative. Most recent labs showed hemoglobin of 8.8, ferritin 2 and saturation 3.8%.  She is scheduled for uterine ablation procedure on November 30.  Reports dizziness and shortness of breath on exertion.  She has weight loss due to limitations in what she can eat with her dissection.  Energy level is low.  Feels cold.  S/p uterine ablation  Interval history- She presents today for discussion and consideration of additional Venofer treatments. She reports that she had an episodes of dizziness after laying down on the couch a few night ago. Felt off balanced. BP was normal but higher for her. Symptoms improved except for a bit of a "fog". They did not seek medical attention. This has not recurred. She saw her PCP for this a few days later and labs showed a low ferritin level. She has tolerated IV Venofer in the past and reports that her chronic fatigue and mild dizziness often is triggered by low ferritin levels. She reports no blood loss such as hemoptysis, epistaxis, tarry or bloody stools. Has gained weight since learning what she can and cannot tolerate.  Wt Readings from Last 3  Encounters:  02/25/22 117 lb 4.8 oz (53.2 kg)  02/20/22 115 lb (52.2 kg)  01/28/22 115 lb 9.6 oz (52.4 kg)     REVIEW OF SYSTEMS:   Review of Systems  Constitutional:  Positive for malaise/fatigue. Negative for weight loss.  HENT:  Negative for nosebleeds.   Respiratory:  Negative for sputum production and shortness of breath.   Gastrointestinal:  Negative for abdominal pain, blood in stool, constipation and melena.  Genitourinary:  Negative for hematuria.  Neurological:  Positive for dizziness.    As per HPI. Otherwise, a complete review of systems is negative.  PAST MEDICAL HISTORY: Past Medical History:  Diagnosis Date   Abdominal pain, right upper quadrant    Allergic rhinitis, cause unspecified    Allergy-induced asthma    Anemia    Aneurysm of right renal artery (HCC)    Anxiety    Arthritis    Depressive disorder, not elsewhere classified    Dizziness and giddiness    Esophageal reflux    Fibromuscular dysplasia (HCC)    Gallstones    Headache    Liver lesion    Nontoxic multinodular goiter    Other chronic sinusitis    Other malaise and fatigue    Pain in limb    Peptic ulcer, unspecified site, unspecified as acute or chronic, without mention of hemorrhage, perforation, or obstruction    Premenstrual tension syndromes    Pure hypercholesterolemia    Unspecified hypothyroidism     PAST SURGICAL HISTORY: Past Surgical History:  Procedure Laterality Date   AORTA -  SUPERIOR MESENTERIC ARTERY BYPASS GRAFT     with 2 stents   BREAST BIOPSY Right 06/01/2015   stereo, path pending   DILATION AND CURETTAGE OF UTERUS  01/08/2000   DILITATION & CURRETTAGE/HYSTROSCOPY WITH NOVASURE ABLATION N/A 12/06/2021   Procedure: DILATATION & CURETTAGE/HYSTEROSCOPY WITH NOVASURE ABLATION;  Surgeon: Paula Compton, MD;  Location: [facility];  Service: Gynecology;  Laterality: N/A;   LEEP  01/07/1998    FAMILY HISTORY: Family History  Problem Relation Age of Onset    Hyperthyroidism Mother    Irritable bowel syndrome Mother    Colon polyps Father    Hypertension Father    Hypothyroidism Father    Other Sister        Collagen Vascular Disease   Heart disease Brother    Atrial fibrillation Brother 62   Breast cancer Cousin     HEALTH MAINTENANCE: Social History   Tobacco Use   Smoking status: Never   Smokeless tobacco: Never  Vaping Use   Vaping Use: Never used  Substance Use Topics   Alcohol use: No   Drug use: No     Allergies  Allergen Reactions   Molds & Smuts Shortness Of Breath    Current Outpatient Medications  Medication Sig Dispense Refill   acetaminophen (TYLENOL) 500 MG tablet Take 1,000 mg by mouth every 6 (six) hours as needed for moderate pain.     albuterol (VENTOLIN HFA) 108 (90 Base) MCG/ACT inhaler Inhale 1-2 puffs into the lungs every 6 (six) hours as needed for wheezing or shortness of breath.     ALPRAZolam (XANAX) 0.5 MG tablet Can use  1 tab daily  as needed for anxiety on long trips. 20 tablet 0   apixaban (ELIQUIS) 5 MG TABS tablet Take 5 mg by mouth 2 (two) times daily.     ARNUITY ELLIPTA 100 MCG/ACT AEPB Inhale 1 puff into the lungs daily as needed (shortness of breath).  5   aspirin 81 MG chewable tablet Chew 81 mg by mouth daily.     azelastine (OPTIVAR) 0.05 % ophthalmic solution Place 1 drop into both eyes 2 (two) times daily as needed (allergies).     cetirizine (ZYRTEC) 10 MG tablet Take 10 mg by mouth daily.     Cholecalciferol (DIALYVITE VITAMIN D 5000) 125 MCG (5000 UT) capsule Take 5,000 Units by mouth daily.     COLLAGEN PO Take 1 Scoop by mouth daily.     diphenhydrAMINE (BENADRYL) 25 MG tablet Take 25 mg by mouth at bedtime as needed for itching.     fexofenadine (ALLEGRA) 180 MG tablet Take 180 mg by mouth daily as needed for allergies.  5   fluticasone (FLONASE) 50 MCG/ACT nasal spray Place 2 sprays into both nostrils daily as needed for allergies.     hydrocortisone cream 1 % Apply 1  Application topically 2 (two) times daily as needed for itching.     ipratropium (ATROVENT) 0.06 % nasal spray Place 2 sprays into the nose daily.     montelukast (SINGULAIR) 10 MG tablet Take 10 mg by mouth at bedtime.     Multiple Vitamin (MULTIVITAMIN) tablet Take 1 tablet by mouth daily.     ondansetron (ZOFRAN-ODT) 4 MG disintegrating tablet Take 1 tablet (4 mg total) by mouth every 8 (eight) hours as needed for nausea or vomiting. 20 tablet 0   PREBIOTIC PRODUCT PO Take 1 capsule by mouth daily.     Probiotic Product (PROBIOTIC FORMULA PO) Take 1 tablet by mouth  daily.     Ubrogepant (UBRELVY) 100 MG TABS Take 1 tablet by mouth daily as needed (For Migraines). Can repeat dose after 72 hours     oxyCODONE (ROXICODONE) 5 MG immediate release tablet Take 1 tablet (5 mg total) by mouth every 4 (four) hours as needed for severe pain. (Patient not taking: Reported on 02/25/2022) 10 tablet 0   No current facility-administered medications for this visit.    OBJECTIVE: Vitals:   02/25/22 1136  BP: 115/89  Pulse: 75  Resp: 20  Temp: 97.8 F (36.6 C)  SpO2: 100%     Body mass index is 22.16 kg/m.      Physical Exam Vitals and nursing note reviewed.  Constitutional:      General: She is not in acute distress.    Appearance: Normal appearance. She is not ill-appearing, toxic-appearing or diaphoretic.  HENT:     Head: Normocephalic.  Eyes:     Extraocular Movements: Extraocular movements intact.     Pupils: Pupils are equal, round, and reactive to light.  Cardiovascular:     Rate and Rhythm: Regular rhythm. Tachycardia present.     Heart sounds: Normal heart sounds.  Pulmonary:     Effort: Pulmonary effort is normal.     Breath sounds: Normal breath sounds.  Abdominal:     General: Bowel sounds are normal.     Palpations: Abdomen is soft.  Skin:    Coloration: Skin is not pale.     Findings: No rash.  Neurological:     General: No focal deficit present.     Mental Status: She  is alert and oriented to person, place, and time. Mental status is at baseline.     Cranial Nerves: No cranial nerve deficit.     Sensory: No sensory deficit.     Motor: No weakness.     Coordination: Coordination normal.     Gait: Gait normal.      LAB RESULTS:  Lab Results  Component Value Date   NA 138 12/06/2021   K 3.6 12/06/2021   CL 108 12/06/2021   CO2 25 12/06/2021   GLUCOSE 73 12/06/2021   BUN 12 12/06/2021   CREATININE 0.72 12/06/2021   CALCIUM 8.6 (L) 12/06/2021   PROT 7.0 05/29/2021   ALBUMIN 4.0 05/29/2021   AST 27 05/29/2021   ALT 30 05/29/2021   ALKPHOS 60 05/29/2021   BILITOT 0.4 05/29/2021   GFRNONAA >60 12/06/2021   GFRAA 98 08/05/2019    Lab Results  Component Value Date   WBC 7.3 02/20/2022   NEUTROABS 4.7 02/20/2022   HGB 13.1 02/20/2022   HCT 38.5 02/20/2022   MCV 90.9 02/20/2022   PLT 264.0 02/20/2022    Lab Results  Component Value Date   TIBC 352.8 02/20/2022   TIBC 357 01/28/2022   TIBC 443.8 11/13/2021   FERRITIN 18.0 02/20/2022   FERRITIN 32 01/28/2022   FERRITIN 2.3 (L) 11/13/2021   IRONPCTSAT 20.7 02/20/2022   IRONPCTSAT 35 (H) 01/28/2022   IRONPCTSAT 3.8 (L) 11/13/2021     STUDIES: No results found.  ASSESSMENT AND PLAN:   Isabel Foley is a 52 y.o. female with pmh of of gallstones, hypothyroidism, hyperlipidemia and fibromuscular dysplasia causing SMA dissection follows at [facility] was referred to hematology for management of iron deficiency anemia.  #Iron Deficiency anemia -Likely secondary to heavy menstrual period worsened with blood thinners.  -Hemoglobin has responded well to IV Iron but ferritin levels are again low. Hgb 5 days ago  13.1, MCV 90.9, platelets 264.4. Ferritin 18 No evidence of acute bleed Restarting Venofer weekly x 5 Referral to GI as ferritin continues to fall with improved uterine bleeding- Pt states she is established with GI at [facility] and will self schedule follow up.   # Fibromuscular  dysplasia # History of SMA dissection -Her FMD specialist at [facility].  She follows with vascular and cardiology also.  Reports that she is planned for for CT head due to around April. Not changes to this plan at this time.    Venofer weekly x 5 RTC 2 months MD, labs  Patient expressed understanding and was in agreement with this plan. She also understands that She can call clinic at any time with any questions, concerns, or complaints.   I spent a total of 25 minutes reviewing chart data, face-to-face evaluation with the patient, counseling and coordination of care as detailed above.  Hughie Closs, PA-C   02/25/2022 4:03 PM

## 2022-02-27 ENCOUNTER — Encounter: Payer: Self-pay | Admitting: Family Medicine

## 2022-02-28 MED FILL — Iron Sucrose Inj 20 MG/ML (Fe Equiv): INTRAVENOUS | Qty: 10 | Status: AC

## 2022-03-01 ENCOUNTER — Inpatient Hospital Stay: Payer: 59

## 2022-03-01 VITALS — BP 104/75 | HR 71 | Temp 96.5°F | Resp 18

## 2022-03-01 DIAGNOSIS — D500 Iron deficiency anemia secondary to blood loss (chronic): Secondary | ICD-10-CM

## 2022-03-01 DIAGNOSIS — D509 Iron deficiency anemia, unspecified: Secondary | ICD-10-CM | POA: Diagnosis not present

## 2022-03-01 MED ORDER — SODIUM CHLORIDE 0.9% FLUSH
10.0000 mL | Freq: Once | INTRAVENOUS | Status: AC | PRN
  Administered 2022-03-01: 10 mL
  Filled 2022-03-01: qty 10

## 2022-03-01 MED ORDER — SODIUM CHLORIDE 0.9 % IV SOLN
200.0000 mg | Freq: Once | INTRAVENOUS | Status: AC
  Administered 2022-03-01: 200 mg via INTRAVENOUS
  Filled 2022-03-01: qty 200

## 2022-03-01 MED ORDER — SODIUM CHLORIDE 0.9 % IV SOLN
Freq: Once | INTRAVENOUS | Status: AC
  Filled 2022-03-01: qty 250

## 2022-03-01 NOTE — Patient Instructions (Signed)

## 2022-03-01 NOTE — Progress Notes (Signed)
Patient tolerated Venofer infusion well. Explained recommendation of 30 min post monitoring. Patient refused to wait post monitoring. Educated on what signs to watch for & to call with any concerns. No questions, discharged. Stable

## 2022-03-04 ENCOUNTER — Inpatient Hospital Stay: Payer: 59

## 2022-03-04 VITALS — BP 133/72 | HR 81 | Temp 97.8°F

## 2022-03-04 DIAGNOSIS — D509 Iron deficiency anemia, unspecified: Secondary | ICD-10-CM | POA: Diagnosis not present

## 2022-03-04 DIAGNOSIS — D500 Iron deficiency anemia secondary to blood loss (chronic): Secondary | ICD-10-CM

## 2022-03-04 MED ORDER — SODIUM CHLORIDE 0.9 % IV SOLN
200.0000 mg | Freq: Once | INTRAVENOUS | Status: AC
  Administered 2022-03-04: 200 mg via INTRAVENOUS
  Filled 2022-03-04: qty 10

## 2022-03-04 MED ORDER — SODIUM CHLORIDE 0.9 % IV SOLN
Freq: Once | INTRAVENOUS | Status: AC
  Filled 2022-03-04: qty 250

## 2022-03-04 NOTE — Patient Instructions (Signed)

## 2022-03-06 ENCOUNTER — Inpatient Hospital Stay: Payer: 59

## 2022-03-06 VITALS — BP 111/63 | HR 72 | Temp 97.0°F | Resp 16

## 2022-03-06 DIAGNOSIS — D500 Iron deficiency anemia secondary to blood loss (chronic): Secondary | ICD-10-CM

## 2022-03-06 DIAGNOSIS — D509 Iron deficiency anemia, unspecified: Secondary | ICD-10-CM | POA: Diagnosis not present

## 2022-03-06 MED ORDER — SODIUM CHLORIDE 0.9 % IV SOLN
200.0000 mg | Freq: Once | INTRAVENOUS | Status: AC
  Administered 2022-03-06: 200 mg via INTRAVENOUS
  Filled 2022-03-06: qty 200

## 2022-03-06 MED ORDER — SODIUM CHLORIDE 0.9 % IV SOLN
Freq: Once | INTRAVENOUS | Status: AC
  Filled 2022-03-06: qty 250

## 2022-03-06 NOTE — Patient Instructions (Signed)

## 2022-03-07 MED FILL — Iron Sucrose Inj 20 MG/ML (Fe Equiv): INTRAVENOUS | Qty: 10 | Status: AC

## 2022-03-08 ENCOUNTER — Inpatient Hospital Stay: Payer: 59 | Attending: Internal Medicine

## 2022-03-08 VITALS — BP 109/82 | HR 80 | Temp 98.0°F | Resp 17

## 2022-03-08 DIAGNOSIS — D509 Iron deficiency anemia, unspecified: Secondary | ICD-10-CM | POA: Diagnosis present

## 2022-03-08 DIAGNOSIS — D500 Iron deficiency anemia secondary to blood loss (chronic): Secondary | ICD-10-CM

## 2022-03-08 MED ORDER — SODIUM CHLORIDE 0.9 % IV SOLN
200.0000 mg | Freq: Once | INTRAVENOUS | Status: AC
  Administered 2022-03-08: 200 mg via INTRAVENOUS
  Filled 2022-03-08: qty 200

## 2022-03-08 MED ORDER — SODIUM CHLORIDE 0.9 % IV SOLN
Freq: Once | INTRAVENOUS | Status: AC
  Filled 2022-03-08: qty 250

## 2022-03-11 ENCOUNTER — Inpatient Hospital Stay: Payer: 59

## 2022-03-11 VITALS — BP 110/73 | HR 69 | Temp 96.6°F | Resp 16

## 2022-03-11 DIAGNOSIS — D500 Iron deficiency anemia secondary to blood loss (chronic): Secondary | ICD-10-CM

## 2022-03-11 DIAGNOSIS — D509 Iron deficiency anemia, unspecified: Secondary | ICD-10-CM | POA: Diagnosis not present

## 2022-03-11 MED ORDER — SODIUM CHLORIDE 0.9 % IV SOLN
Freq: Once | INTRAVENOUS | Status: AC
  Filled 2022-03-11: qty 250

## 2022-03-11 MED ORDER — SODIUM CHLORIDE 0.9 % IV SOLN
200.0000 mg | Freq: Once | INTRAVENOUS | Status: AC
  Administered 2022-03-11: 200 mg via INTRAVENOUS
  Filled 2022-03-11: qty 200

## 2022-03-11 NOTE — Patient Instructions (Signed)

## 2022-03-11 NOTE — Progress Notes (Signed)
Refused 30 minute post-venofer observation. Vital signs stable at discharge. Aware of signs/symptoms of concern and when to seek medical attention.

## 2022-03-14 ENCOUNTER — Encounter: Payer: Self-pay | Admitting: Internal Medicine

## 2022-03-14 ENCOUNTER — Other Ambulatory Visit: Payer: Self-pay | Admitting: *Deleted

## 2022-03-14 DIAGNOSIS — D500 Iron deficiency anemia secondary to blood loss (chronic): Secondary | ICD-10-CM

## 2022-03-21 ENCOUNTER — Encounter: Payer: Self-pay | Admitting: Internal Medicine

## 2022-04-09 ENCOUNTER — Other Ambulatory Visit: Payer: Self-pay

## 2022-04-18 ENCOUNTER — Encounter: Payer: Self-pay | Admitting: Gastroenterology

## 2022-04-18 ENCOUNTER — Ambulatory Visit (INDEPENDENT_AMBULATORY_CARE_PROVIDER_SITE_OTHER): Payer: 59 | Admitting: Gastroenterology

## 2022-04-18 ENCOUNTER — Other Ambulatory Visit: Payer: Self-pay

## 2022-04-18 VITALS — BP 118/82 | HR 80 | Temp 98.3°F | Ht 61.0 in | Wt 115.4 lb

## 2022-04-18 DIAGNOSIS — K769 Liver disease, unspecified: Secondary | ICD-10-CM | POA: Diagnosis not present

## 2022-04-18 DIAGNOSIS — D509 Iron deficiency anemia, unspecified: Secondary | ICD-10-CM

## 2022-04-18 DIAGNOSIS — D500 Iron deficiency anemia secondary to blood loss (chronic): Secondary | ICD-10-CM | POA: Diagnosis not present

## 2022-04-18 MED ORDER — NA SULFATE-K SULFATE-MG SULF 17.5-3.13-1.6 GM/177ML PO SOLN: 354.0000 mL | Freq: Once | ORAL | 0 refills | Status: AC

## 2022-04-18 NOTE — Progress Notes (Signed)
Arlyss Repress, MD [address]  [address]  [place], [place] [address]  Main: [phone]  Fax: [phone]    Gastroenterology Consultation  Referring Provider:     Excell Seltzer, MD Primary Care Physician:  Excell Seltzer, MD Primary Gastroenterologist:  Dr. Arlyss Repress Reason for Consultation: Iron deficiency anemia        HPI:   Isabel Foley is a 52 y.o. female referred by Dr. Excell Seltzer, MD  for consultation & management of iron deficiency anemia.  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  She was diagnosed with anemia in 02/2021, lowest hemoglobin 8.8 in 11/2021, serum iron 2.3 in 11/2021, patient has history of heavy menstrual cycles, underwent endometrial ablation which regulated her menstrual flow significantly.  She is followed by hematology, received 2 iron infusions and her anemia has resolved.  Patient denies any GI symptoms.  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  Her weight has been stable, likes to consume more sugars/carbs.  She never had a colonoscopy  CT angio abdomen and pelvis in 02/2021 revealed liver lesion consistent with benign hemangioma as well as likely benign cysts.  She is due for repeat liver imaging.  LFTs have been normal.  For evaluation of  upper abdominal pain which was postprandial associated with weight loss in 06/2021, she was evaluated by gastroenterologist, Dr. Orvan Falconer, underwent HIDA scan which revealed reduced gallbladder ejection fraction.  She was evaluated by the surgeon at [facility], who deferred cholecystectomy and felt her symptoms are secondary to chronic mesenteric ischemia.  NSAIDs: None  Antiplts/Anticoagulants/Anti thrombotics:  Aspirin 81, Eliquis for history of SMA dissection  GI Procedures: None She denies any family history of GI malignancy  Past Medical History:  Diagnosis Date   Abdominal pain, right upper quadrant    Allergic rhinitis, cause unspecified    Allergy-induced asthma    Anemia    Aneurysm of right renal artery    Anxiety    Arthritis    Depressive disorder, not elsewhere classified    Dizziness and giddiness    Esophageal reflux    Fibromuscular dysplasia    Gallstones    Headache    Liver lesion    Nontoxic multinodular goiter    Other chronic sinusitis    Other malaise and fatigue    Pain in limb    Peptic ulcer, unspecified site, unspecified as acute or chronic, without mention of hemorrhage, perforation, or obstruction    Premenstrual tension syndromes    Pure hypercholesterolemia    Unspecified hypothyroidism     Past Surgical History:  Procedure Laterality Date   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     with 2 stents   BREAST BIOPSY Right 06/01/2015   stereo, path pending   DILATION AND CURETTAGE OF UTERUS  01/08/2000   DILITATION & CURRETTAGE/HYSTROSCOPY WITH NOVASURE ABLATION N/A 12/06/2021   Procedure: DILATATION & CURETTAGE/HYSTEROSCOPY WITH NOVASURE ABLATION;  Surgeon: Huel Cote, MD;  Location: [facility] OR;  Service: Gynecology;  Laterality: N/A;   LEEP  01/07/1998     Current Outpatient Medications:    acetaminophen (TYLENOL) 500 MG tablet, Take 1,000 mg by mouth every 6 (six) hours as needed for moderate pain., Disp: , Rfl:  albuterol (VENTOLIN HFA) 108 (90 Base) MCG/ACT inhaler, Inhale 1-2 puffs into the lungs every 6 (six) hours as needed for wheezing or shortness of breath., Disp: , Rfl:    ALPRAZolam (XANAX) 0.5 MG tablet, Can use  1 tab daily  as needed for anxiety on long trips., Disp: 20 tablet, Rfl: 0   apixaban (ELIQUIS) 5 MG TABS tablet, Take 5 mg by mouth 2 (two) times daily., Disp: , Rfl:    ARNUITY ELLIPTA 100 MCG/ACT AEPB, Inhale 1  puff into the lungs daily as needed (shortness of breath)., Disp: , Rfl: 5   aspirin 81 MG chewable tablet, Chew 81 mg by mouth daily., Disp: , Rfl:    azelastine (OPTIVAR) 0.05 % ophthalmic solution, Place 1 drop into both eyes 2 (two) times daily as needed (allergies)., Disp: , Rfl:    cetirizine (ZYRTEC) 10 MG tablet, Take 10 mg by mouth daily., Disp: , Rfl:    Cholecalciferol (DIALYVITE VITAMIN D 5000) 125 MCG (5000 UT) capsule, Take 5,000 Units by mouth daily., Disp: , Rfl:    COLLAGEN PO, Take 1 Scoop by mouth daily., Disp: , Rfl:    cyclobenzaprine (FLEXERIL) 5 MG tablet, Take 5 mg by mouth 3 (three) times daily as needed for muscle spasms., Disp: , Rfl:    diphenhydrAMINE (BENADRYL) 25 MG tablet, Take 25 mg by mouth at bedtime as needed for itching., Disp: , Rfl:    fexofenadine (ALLEGRA) 180 MG tablet, Take 180 mg by mouth daily as needed for allergies., Disp: , Rfl: 5   fluticasone (FLONASE) 50 MCG/ACT nasal spray, Place 2 sprays into both nostrils daily as needed for allergies., Disp: , Rfl:    hydrocortisone cream 1 %, Apply 1 Application topically 2 (two) times daily as needed for itching., Disp: , Rfl:    montelukast (SINGULAIR) 10 MG tablet, Take 10 mg by mouth at bedtime., Disp: , Rfl:    Multiple Vitamin (MULTIVITAMIN) tablet, Take 1 tablet by mouth daily., Disp: , Rfl:    PREBIOTIC PRODUCT PO, Take 1 capsule by mouth daily., Disp: , Rfl:    Probiotic Product (PROBIOTIC FORMULA PO), Take 1 tablet by mouth daily., Disp: , Rfl:    Ubrogepant (UBRELVY) 100 MG TABS, Take 1 tablet by mouth daily as needed (For Migraines). Can repeat dose after 72 hours, Disp: , Rfl:    zonisamide (ZONEGRAN) 25 MG capsule, Take 25 mg by mouth daily. (Patient not taking: Reported on 04/18/2022), Disp: , Rfl:    Family History  Problem Relation Age of Onset   Hyperthyroidism Mother    Irritable bowel syndrome Mother    Colon polyps Father    Hypertension Father    Hypothyroidism Father    Other  Sister        Collagen Vascular Disease   Heart disease Brother    Atrial fibrillation Brother 20   Breast cancer Cousin      Social History   Tobacco Use   Smoking status: Never   Smokeless tobacco: Never  Vaping Use   Vaping Use: Never used  Substance Use Topics   Alcohol use: No   Drug use: No    Allergies as of 04/18/2022 - Review Complete 04/18/2022  Allergen Reaction Noted   Molds & smuts Shortness Of Breath 04/27/2021    Review of Systems:    All systems reviewed and negative except where noted in HPI.   Physical Exam:  BP 118/82 (BP Location: Left Arm, Patient Position: Sitting, Cuff Size: Normal)  Pulse 80   Temp 98.3 F (36.8 C) (Oral)   Ht 5\' 1"  (1.549 m)   Wt 115 lb 6 oz (52.3 kg)   BMI 21.80 kg/m  No LMP recorded. Patient has had an ablation.  General:   Alert,  Well-developed, well-nourished, pleasant and cooperative in NAD Head:  Normocephalic and atraumatic. Eyes:  Sclera clear, no icterus.   Conjunctiva pink. Ears:  Normal auditory acuity. Nose:  No deformity, discharge, or lesions. Mouth:  No deformity or lesions,oropharynx pink & moist. Neck:  Supple; no masses or thyromegaly. Lungs:  Respirations even and unlabored.  Clear throughout to auscultation.   No wheezes, crackles, or rhonchi. No acute distress. Heart:  Regular rate and rhythm; no murmurs, clicks, rubs, or gallops. Abdomen:  Normal bowel sounds. Soft, non-tender and non-distended without masses, hepatosplenomegaly or hernias noted.  No guarding or rebound tenderness.   Rectal: Not performed Msk:  Symmetrical without gross deformities. Good, equal movement & strength bilaterally. Pulses:  Normal pulses noted. Extremities:  No clubbing or edema.  No cyanosis. Neurologic:  Alert and oriented x3;  grossly normal neurologically. Skin:  Intact without significant lesions or rashes. No jaundice. Psych:  Alert and cooperative. Normal mood and affect.  Imaging  Studies: Reviewed  Assessment and Plan:   Tahari Clabaugh is a 52 y.o. female with history of fibromuscular dysplasia, SMA dissection in 02/2021 resulting in mesenteric ischemia s/p stent placement x 2 in 04/2021.  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  Currently on aspirin and Eliquis per vascular surgery.  Patient is closely followed by the cardiologist who specializes in FMD, Dr. Selena Batten at [facility] as well as the vascular surgeon, Dr. Nedra Hai at [facility].  Patient developed iron deficiency anemia, most likely secondary to menorrhagia, s/p uterine ablation and IV iron replacement resulting in resolution of iron deficiency anemia.  Iron deficiency anemia, most likely secondary to menorrhagia s/p uterine ablation Anemia has currently resolved and responded to parenteral iron therapy Discussed with patient regarding upper endoscopy to evaluate epigastric pain as well as screening colonoscopy once we obtain clearance from Dr. Selena Batten at [facility] health as well as vascular surgeon regarding cardiac clearance given her history of fibromuscular dysplasia as well as interruption/bridging of her anticoagulation If patient is deemed high risk candidate, recommend noninvasive colon cancer screening modalities such as Cologuard or FIT testing  Epigastric pain EGD as above after obtaining cardiac clearance HIDA scan consistent with biliary dyskinesia, follow-up with general surgery at [facility] to evaluate for cholecystectomy  Liver lesion: Likely benign hemangioma Recommend MRI liver mass protocol   Follow up as needed   Arlyss Repress, MD

## 2022-04-18 NOTE — Patient Instructions (Signed)
Your MRI is schedule for 05/02/2022 [facility] at 9:30am for a 10:00am scan. Nothing to eat or drink 4 hours before the scan.

## 2022-04-23 ENCOUNTER — Encounter: Payer: Self-pay | Admitting: Gastroenterology

## 2022-04-24 ENCOUNTER — Ambulatory Visit: Payer: 59 | Admitting: Gastroenterology

## 2022-04-25 ENCOUNTER — Other Ambulatory Visit: Payer: Self-pay | Admitting: Student

## 2022-04-25 ENCOUNTER — Encounter: Payer: Self-pay | Admitting: Internal Medicine

## 2022-04-25 DIAGNOSIS — R42 Dizziness and giddiness: Secondary | ICD-10-CM

## 2022-04-25 DIAGNOSIS — Z8673 Personal history of transient ischemic attack (TIA), and cerebral infarction without residual deficits: Secondary | ICD-10-CM

## 2022-04-25 DIAGNOSIS — R29898 Other symptoms and signs involving the musculoskeletal system: Secondary | ICD-10-CM

## 2022-04-26 ENCOUNTER — Ambulatory Visit: Payer: 59 | Admitting: Internal Medicine

## 2022-04-26 ENCOUNTER — Other Ambulatory Visit: Payer: 59

## 2022-04-26 ENCOUNTER — Encounter: Payer: Self-pay | Admitting: Gastroenterology

## 2022-04-28 ENCOUNTER — Ambulatory Visit
Admission: RE | Admit: 2022-04-28 | Discharge: 2022-04-28 | Disposition: A | Discharge status: Home / Self Care | Payer: 59 | Source: Ambulatory Visit | Attending: Student | Admitting: Student

## 2022-04-28 DIAGNOSIS — Z8673 Personal history of transient ischemic attack (TIA), and cerebral infarction without residual deficits: Secondary | ICD-10-CM | POA: Diagnosis present

## 2022-04-28 DIAGNOSIS — R29898 Other symptoms and signs involving the musculoskeletal system: Secondary | ICD-10-CM | POA: Diagnosis present

## 2022-04-28 DIAGNOSIS — R42 Dizziness and giddiness: Secondary | ICD-10-CM | POA: Insufficient documentation

## 2022-04-28 MED ORDER — GADOBUTROL 1 MMOL/ML IV SOLN
5.0000 mL | Freq: Once | INTRAVENOUS | Status: AC | PRN
  Administered 2022-04-28: 5 mL via INTRAVENOUS

## 2022-04-29 ENCOUNTER — Telehealth: Payer: Self-pay

## 2022-04-29 NOTE — Telephone Encounter (Signed)
Per pt phone call she sent in information that was needed to get MRI approved. Pt states if you have any question please call her at [phone]

## 2022-04-29 NOTE — Telephone Encounter (Signed)
Case ID [id number] Called to check the status and they said it was still pending medical review. They had all the information they needed

## 2022-04-29 NOTE — Telephone Encounter (Signed)
Called insurance company and Case ID [id number] Called to check the status and they said it was still pending medical review. They had all the information they needed. Called and informed patient.

## 2022-05-02 ENCOUNTER — Ambulatory Visit
Admission: RE | Admit: 2022-05-02 | Discharge: 2022-05-02 | Disposition: A | Discharge status: Home / Self Care | Payer: 59 | Source: Ambulatory Visit | Attending: Gastroenterology | Admitting: Gastroenterology

## 2022-05-02 DIAGNOSIS — K769 Liver disease, unspecified: Secondary | ICD-10-CM | POA: Diagnosis not present

## 2022-05-02 MED ORDER — GADOBUTROL 1 MMOL/ML IV SOLN
5.0000 mL | Freq: Once | INTRAVENOUS | Status: AC | PRN
  Administered 2022-05-02: 5 mL via INTRAVENOUS

## 2022-05-06 ENCOUNTER — Telehealth: Payer: Self-pay

## 2022-05-06 NOTE — Telephone Encounter (Signed)
Pt want to know the approval number for MRI of the abd. I read her what I thought was the approval number.Pt want to make sure it was correct. I gave here          [id number]        [id number]: Abdomen MRI [id number] - [id number]        04/25/2022        Approved         04/30/2022   She states she had MRI on 05-02-2022

## 2022-05-06 NOTE — Telephone Encounter (Signed)
Per Dr. Doristine Church patient needs to stop Eliquis 3 days before procedure. Last does is on June 10th. Will need Lovenox bridge. Start Lovenox twice daily for 3 days. June 11,12 and 13.   Patient verbalized understanding of instructions

## 2022-05-06 NOTE — Telephone Encounter (Signed)
Called and gave her the approval authorization.

## 2022-05-07 ENCOUNTER — Encounter: Payer: Self-pay | Admitting: Gastroenterology

## 2022-05-22 ENCOUNTER — Other Ambulatory Visit: Payer: Self-pay | Admitting: Family Medicine

## 2022-05-22 NOTE — Telephone Encounter (Signed)
Refill Alprazolam Last office visit 02/20/22 Last refill 03/21/20 #20

## 2022-05-23 MED ORDER — ALPRAZOLAM 0.5 MG PO TABS: ORAL_TABLET | ORAL | 0 refills | Status: DC

## 2022-06-04 ENCOUNTER — Encounter: Payer: Self-pay | Admitting: Family Medicine

## 2022-06-12 ENCOUNTER — Encounter: Payer: Self-pay | Admitting: Family Medicine

## 2022-06-13 ENCOUNTER — Encounter: Payer: Self-pay | Admitting: Family Medicine

## 2022-06-13 NOTE — Telephone Encounter (Signed)
Pt returned call . Advise that letter was ready for pick up  .

## 2022-06-14 ENCOUNTER — Other Ambulatory Visit: Payer: Self-pay | Admitting: Family Medicine

## 2022-06-14 NOTE — Progress Notes (Signed)
Call patient   I still need a diagnosis to place the referral to Dr. Tedd Sias if she still needs it... last Mychart message sounded like Dr. Gregery Na office may have taken care of it, but I am unsure.

## 2022-06-17 ENCOUNTER — Encounter: Payer: Self-pay | Admitting: Gastroenterology

## 2022-06-20 ENCOUNTER — Encounter: Payer: Self-pay | Admitting: Gastroenterology

## 2022-06-21 ENCOUNTER — Encounter
Admission: RE | Disposition: A | Discharge status: Home / Self Care | Payer: Self-pay | Source: Home / Self Care | Attending: Gastroenterology

## 2022-06-21 ENCOUNTER — Ambulatory Visit: Payer: 59 | Admitting: Anesthesiology

## 2022-06-21 ENCOUNTER — Ambulatory Visit
Admission: RE | Admit: 2022-06-21 | Discharge: 2022-06-21 | Disposition: A | Discharge status: Home / Self Care | Payer: 59 | Attending: Gastroenterology | Admitting: Gastroenterology

## 2022-06-21 DIAGNOSIS — J45909 Unspecified asthma, uncomplicated: Secondary | ICD-10-CM | POA: Diagnosis not present

## 2022-06-21 DIAGNOSIS — K635 Polyp of colon: Secondary | ICD-10-CM | POA: Diagnosis not present

## 2022-06-21 DIAGNOSIS — D122 Benign neoplasm of ascending colon: Secondary | ICD-10-CM

## 2022-06-21 DIAGNOSIS — I739 Peripheral vascular disease, unspecified: Secondary | ICD-10-CM | POA: Insufficient documentation

## 2022-06-21 DIAGNOSIS — G8929 Other chronic pain: Secondary | ICD-10-CM

## 2022-06-21 DIAGNOSIS — Z83719 Family history of colon polyps, unspecified: Secondary | ICD-10-CM | POA: Insufficient documentation

## 2022-06-21 DIAGNOSIS — R1013 Epigastric pain: Secondary | ICD-10-CM

## 2022-06-21 DIAGNOSIS — Z8711 Personal history of peptic ulcer disease: Secondary | ICD-10-CM | POA: Diagnosis not present

## 2022-06-21 DIAGNOSIS — M1990 Unspecified osteoarthritis, unspecified site: Secondary | ICD-10-CM | POA: Insufficient documentation

## 2022-06-21 DIAGNOSIS — Z1211 Encounter for screening for malignant neoplasm of colon: Secondary | ICD-10-CM

## 2022-06-21 DIAGNOSIS — F418 Other specified anxiety disorders: Secondary | ICD-10-CM | POA: Insufficient documentation

## 2022-06-21 DIAGNOSIS — D509 Iron deficiency anemia, unspecified: Secondary | ICD-10-CM

## 2022-06-21 HISTORY — DX: Transient cerebral ischemic attack, unspecified: G45.9

## 2022-06-21 HISTORY — PX: COLONOSCOPY WITH PROPOFOL: SHX5780

## 2022-06-21 HISTORY — PX: ESOPHAGOGASTRODUODENOSCOPY (EGD) WITH PROPOFOL: SHX5813

## 2022-06-21 LAB — POCT PREGNANCY, URINE: Preg Test, Ur: NEGATIVE

## 2022-06-21 SURGERY — COLONOSCOPY WITH PROPOFOL
Anesthesia: General

## 2022-06-21 MED ORDER — PROPOFOL 10 MG/ML IV BOLUS
INTRAVENOUS | Status: DC | PRN
  Administered 2022-06-21: 20 mg via INTRAVENOUS
  Administered 2022-06-21: 50 mg via INTRAVENOUS
  Administered 2022-06-21: 80 mg via INTRAVENOUS

## 2022-06-21 MED ORDER — GLYCOPYRROLATE 0.2 MG/ML IJ SOLN
INTRAMUSCULAR | Status: AC
  Filled 2022-06-21: qty 1

## 2022-06-21 MED ORDER — LIDOCAINE HCL (CARDIAC) PF 100 MG/5ML IV SOSY
PREFILLED_SYRINGE | INTRAVENOUS | Status: DC | PRN
  Administered 2022-06-21: 50 mg via INTRAVENOUS

## 2022-06-21 MED ORDER — PROPOFOL 10 MG/ML IV BOLUS
INTRAVENOUS | Status: AC
  Filled 2022-06-21: qty 40

## 2022-06-21 MED ORDER — PROPOFOL 500 MG/50ML IV EMUL
INTRAVENOUS | Status: DC | PRN
  Administered 2022-06-21: 125 ug/kg/min via INTRAVENOUS

## 2022-06-21 MED ORDER — DEXMEDETOMIDINE HCL IN NACL 80 MCG/20ML IV SOLN
INTRAVENOUS | Status: DC | PRN
  Administered 2022-06-21: 16 ug via INTRAVENOUS

## 2022-06-21 MED ORDER — GLYCOPYRROLATE 0.2 MG/ML IJ SOLN
INTRAMUSCULAR | Status: DC | PRN
  Administered 2022-06-21: .1 mg via INTRAVENOUS

## 2022-06-21 MED ORDER — SODIUM CHLORIDE 0.9 % IV SOLN
INTRAVENOUS | Status: DC
  Administered 2022-06-21: 20 mL/h via INTRAVENOUS

## 2022-06-21 NOTE — Op Note (Signed)
[facility] Gastroenterology Patient Name: Isabel Foley Procedure Date: 06/21/2022 9:47 AM MRN: [id number] Account #: [id number] Date of Birth: 1970/04/25 Admit Type: Outpatient Age: 52 Room: [place] ENDO ROOM 2 Gender: Female Note Status: Finalized Instrument Name: Upper Endoscope [id number] Procedure:             Upper GI endoscopy Indications:           Epigastric abdominal pain Providers:             Toney Reil MD, MD Medicines:             General Anesthesia Complications:         No immediate complications. Estimated blood loss: None. Procedure:             Pre-Anesthesia Assessment:                        - Prior to the procedure, a History and Physical was                         performed, and patient medications and allergies were                         reviewed. The patient is competent. The risks and                         benefits of the procedure and the sedation options and                         risks were discussed with the patient. All questions                         were answered and informed consent was obtained.                         Patient identification and proposed procedure were                         verified by the physician, the nurse, the                         anesthesiologist, the anesthetist and the technician                         in the pre-procedure area in the procedure room in the                         endoscopy suite. Mental Status Examination: alert and                         oriented. Airway Examination: normal oropharyngeal                         airway and neck mobility. Respiratory Examination:                         clear to auscultation. CV Examination: normal.                         Prophylactic Antibiotics:  The patient does not require                         prophylactic antibiotics. Prior Anticoagulants: The                         patient has taken Eliquis (apixaban), last dose was 2                          days prior to procedure. ASA Grade Assessment: III - A                         patient with severe systemic disease. After reviewing                         the risks and benefits, the patient was deemed in                         satisfactory condition to undergo the procedure. The                         anesthesia plan was to use general anesthesia.                         Immediately prior to administration of medications,                         the patient was re-assessed for adequacy to receive                         sedatives. The heart rate, respiratory rate, oxygen                         saturations, blood pressure, adequacy of pulmonary                         ventilation, and response to care were monitored                         throughout the procedure. The physical status of the                         patient was re-assessed after the procedure.                        After obtaining informed consent, the endoscope was                         passed under direct vision. Throughout the procedure,                         the patient's blood pressure, pulse, and oxygen                         saturations were monitored continuously. The                         Endosonoscope was introduced through the mouth, and  advanced to the second part of duodenum. The upper GI                         endoscopy was accomplished without difficulty. The                         patient tolerated the procedure well. Findings:      The duodenal bulb and second portion of the duodenum were normal.       Biopsies were taken with a cold forceps for histology.      The entire examined stomach was normal. Biopsies were taken with a cold       forceps for histology.      The cardia and gastric fundus were normal on retroflexion.      The gastroesophageal junction and examined esophagus were normal. Impression:            - Normal duodenal bulb and second  portion of the                         duodenum. Biopsied.                        - Normal stomach. Biopsied.                        - Normal gastroesophageal junction and esophagus. Recommendation:        - Await pathology results.                        - Proceed with colonoscopy as scheduled                        See colonoscopy report Procedure Code(s):     --- Professional ---                        [phone], Esophagogastroduodenoscopy, flexible,                         transoral; with biopsy, single or multiple Diagnosis Code(s):     --- Professional ---                        R10.13, Epigastric pain CPT copyright 2022 American Medical Association. All rights reserved. The codes documented in this report are preliminary and upon coder review may  be revised to meet current compliance requirements. Dr. Libby Maw Toney Reil MD, MD 06/21/2022 10:12:27 AM This report has been signed electronically. Number of Addenda: 0 Note Initiated On: 06/21/2022 9:47 AM Estimated Blood Loss:  Estimated blood loss: none.      [facility]

## 2022-06-21 NOTE — Progress Notes (Signed)
Spoke with Isabel Foley.  The referral has been taken care of.  Nothing further is needed at this time.

## 2022-06-21 NOTE — H&P (Signed)
Arlyss Repress, MD [address]  [address]  [place], [place] [address]  Main: [phone]  Fax: [phone] Pager: [phone]  Primary Care Physician:  Excell Seltzer, MD Primary Gastroenterologist:  Dr. Arlyss Repress  Pre-Procedure History & Physical: HPI:  Isabel Foley is a 52 y.o. female is here for an endoscopy and colonoscopy.   Past Medical History:  Diagnosis Date   Abdominal pain, right upper quadrant    Allergic rhinitis, cause unspecified    Allergy-induced asthma    Anemia    Aneurysm of right renal artery (HCC)    Anxiety    Arthritis    Depressive disorder, not elsewhere classified    Dizziness and giddiness    Esophageal reflux    Fibromuscular dysplasia (HCC)    Gallstones    Headache    Liver lesion    Nontoxic multinodular goiter    Other chronic sinusitis    Other malaise and fatigue    Pain in limb    Peptic ulcer, unspecified site, unspecified as acute or chronic, without mention of hemorrhage, perforation, or obstruction    Premenstrual tension syndromes    Pure hypercholesterolemia    TIA (transient ischemic attack)    Unspecified hypothyroidism     Past Surgical History:  Procedure Laterality Date   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     with 2 stents   BREAST BIOPSY Right 06/01/2015   stereo, path pending   DILATION AND CURETTAGE OF UTERUS  01/08/2000   DILITATION & CURRETTAGE/HYSTROSCOPY WITH NOVASURE ABLATION N/A 12/06/2021   Procedure: DILATATION & CURETTAGE/HYSTEROSCOPY WITH NOVASURE ABLATION;  Surgeon: Huel Cote, MD;  Location: [facility] OR;  Service: Gynecology;  Laterality: N/A;   LEEP  01/07/1998    Prior to Admission medications   Medication Sig Start Date End Date Taking? Authorizing Provider  acetaminophen (TYLENOL) 500 MG tablet Take 1,000 mg by mouth every 6 (six) hours as needed for moderate pain.   Yes [provider]  albuterol (VENTOLIN HFA) 108 (90 Base) MCG/ACT inhaler Inhale 1-2  puffs into the lungs every 6 (six) hours as needed for wheezing or shortness of breath.   Yes [provider]  ALPRAZolam Prudy Feeler) 0.5 MG tablet Can use  1 tab daily  as needed for anxiety on long trips. 05/23/22  Yes Bedsole, Amy E, MD  apixaban (ELIQUIS) 5 MG TABS tablet Take 5 mg by mouth 2 (two) times daily.   Yes [provider]  ARNUITY ELLIPTA 100 MCG/ACT AEPB Inhale 1 puff into the lungs daily as needed (shortness of breath). 04/08/17  Yes [provider]  aspirin 81 MG chewable tablet Chew 81 mg by mouth daily.   Yes [provider]  azelastine (OPTIVAR) 0.05 % ophthalmic solution Place 1 drop into both eyes 2 (two) times daily as needed (allergies).   Yes [provider]  cetirizine (ZYRTEC) 10 MG tablet Take 10 mg by mouth daily. 04/22/19  Yes [provider]  Cholecalciferol (DIALYVITE VITAMIN D 5000) 125 MCG (5000 UT) capsule Take 5,000 Units by mouth daily.   Yes [provider]  COLLAGEN PO Take 1 Scoop by mouth daily.   Yes [provider]  cyclobenzaprine (FLEXERIL) 5 MG tablet Take 5 mg by mouth 3 (three) times daily as needed for muscle spasms.   Yes [provider]  diphenhydrAMINE (BENADRYL) 25 MG tablet Take 25 mg by mouth at bedtime as needed for itching.   Yes [provider]  fexofenadine (ALLEGRA) 180 MG tablet Take 180 mg by mouth daily as needed for allergies. 05/21/17  Yes [provider]  fluticasone (FLONASE) 50 MCG/ACT nasal spray Place 2 sprays into both nostrils daily as needed for allergies.   Yes [provider]  hydrocortisone cream 1 % Apply 1 Application topically 2 (two) times daily as needed for itching.   Yes [provider]  Multiple Vitamin (MULTIVITAMIN) tablet Take 1 tablet by mouth daily.   Yes [provider]  PREBIOTIC PRODUCT PO Take 1 capsule by mouth daily.   Yes [provider]  Probiotic Product (PROBIOTIC FORMULA PO)  Take 1 tablet by mouth daily.   Yes [provider]  Ubrogepant (UBRELVY) 100 MG TABS Take 1 tablet by mouth daily as needed (For Migraines). Can repeat dose after 72 hours   Yes [provider]  montelukast (SINGULAIR) 10 MG tablet Take 10 mg by mouth at bedtime.    [provider]  zonisamide (ZONEGRAN) 25 MG capsule Take 25 mg by mouth daily. Patient not taking: Reported on 04/18/2022 03/04/22   [provider]    Allergies as of 04/18/2022 - Review Complete 04/18/2022  Allergen Reaction Noted   Molds & smuts Shortness Of Breath 04/27/2021    Family History  Problem Relation Age of Onset   Hyperthyroidism Mother    Irritable bowel syndrome Mother    Colon polyps Father    Hypertension Father    Hypothyroidism Father    Other Sister        Collagen Vascular Disease   Heart disease Brother    Atrial fibrillation Brother 20   Breast cancer Cousin     Social History   Socioeconomic History   Marital status: Married    Spouse name: Not on file   Number of children: 7   Years of education: Not on file   Highest education level: Not on file  Occupational History   Occupation: Stay at home mom  Tobacco Use   Smoking status: Never   Smokeless tobacco: Never  Vaping Use   Vaping Use: Never used  Substance and Sexual Activity   Alcohol use: No   Drug use: No   Sexual activity: Yes    Birth control/protection: Surgical    Comment: husband with vasectomy  Other Topics Concern   Not on file  Social History Narrative   7 children; 6 biologic.Marland KitchenMarland KitchenADHD      Married; previously divorced      Stay at home mom      Regular exercise-yes, daily aerobics, treadmill      Diet: Skips meals, limited veggies; ice cream in AM   Social Determinants of Health   Financial Resource Strain: Not on file  Food Insecurity: Not on file  Transportation Needs: Not on file  Physical Activity: Not on file  Stress: Not on file  Social Connections: Not on  file  Intimate Partner Violence: Not on file    Review of Systems: See HPI, otherwise negative ROS  Physical Exam: BP 128/89   Pulse 76   Temp 98.1 F (36.7 C) (Temporal)   Resp 20   Ht 5\' 1"  (1.549 m)   Wt 51.8 kg   SpO2 100%   BMI 21.58 kg/m  General:   Alert,  pleasant and cooperative in NAD Head:  Normocephalic and atraumatic. Neck:  Supple; no masses or thyromegaly. Lungs:  Clear throughout to auscultation.    Heart:  Regular rate and rhythm. Abdomen:  Soft,  nontender and nondistended. Normal bowel sounds, without guarding, and without rebound.   Neurologic:  Alert and  oriented x4;  grossly normal neurologically.  Impression/Plan: Isabel Foley is here for an endoscopy and colonoscopy to be performed for epigastric pain and colon cancer screening  Risks, benefits, limitations, and alternatives regarding  endoscopy and colonoscopy have been reviewed with the patient.  Questions have been answered.  All parties agreeable.   Lannette Donath, MD  06/21/2022, 9:47 AM

## 2022-06-21 NOTE — Progress Notes (Signed)
Per Patient's My Chart message from 06/18/2022, it looks like Dr. Gregery Na office took care of it.  But I left message for Selena Batten to call or send MyChart verifying this.

## 2022-06-21 NOTE — Op Note (Signed)
[facility] Patient Name: Isabel Foley Procedure Date: 06/21/2022 9:47 AM MRN: [id number] Account #: [id number] Date of Birth: 08/08/1970 Admit Type: Outpatient Age: 52 Room: [facility] ENDO ROOM 2 Gender: Female Note Status: Finalized Instrument Name: Peds Colonoscope [id number] Procedure:             Colonoscopy Indications:           Screening for colorectal malignant neoplasm Providers:             Toney Reil MD, MD Medicines:             General Anesthesia Complications:         No immediate complications. Estimated blood loss: None. Procedure:             Pre-Anesthesia Assessment:                        - Prior to the procedure, a History and Physical was                         performed, and patient medications and allergies were                         reviewed. The patient is competent. The risks and                         benefits of the procedure and the sedation options and                         risks were discussed with the patient. All questions                         were answered and informed consent was obtained.                         Patient identification and proposed procedure were                         verified by the physician, the nurse, the                         anesthesiologist, the anesthetist and the technician                         in the pre-procedure area in the procedure room in the                         endoscopy suite. Mental Status Examination: alert and                         oriented. Airway Examination: normal oropharyngeal                         airway and neck mobility. Respiratory Examination:                         clear to auscultation. CV Examination: normal.                         Prophylactic Antibiotics:  The patient does not require                         prophylactic antibiotics. Prior Anticoagulants: The                         patient has taken Eliquis (apixaban), last dose was  5                         days prior to procedure with lovenox bridge. ASA Grade                         Assessment: III - A patient with severe systemic                         disease. After reviewing the risks and benefits, the                         patient was deemed in satisfactory condition to                         undergo the procedure. The anesthesia plan was to use                         general anesthesia. Immediately prior to                         administration of medications, the patient was                         re-assessed for adequacy to receive sedatives. The                         heart rate, respiratory rate, oxygen saturations,                         blood pressure, adequacy of pulmonary ventilation, and                         response to care were monitored throughout the                         procedure. The physical status of the patient was                         re-assessed after the procedure.                        After obtaining informed consent, the colonoscope was                         passed under direct vision. Throughout the procedure,                         the patient's blood pressure, pulse, and oxygen                         saturations were monitored continuously. The  Colonoscope was introduced through the anus and                         advanced to the 10 cm into the ileum. The colonoscopy                         was performed without difficulty. The patient                         tolerated the procedure well. The quality of the bowel                         preparation was evaluated using the BBPS [facility] Bowel                         Preparation Scale) with scores of: Right Colon = 3,                         Transverse Colon = 3 and Left Colon = 3 (entire mucosa                         seen well with no residual staining, small fragments                         of stool or opaque liquid). The total BBPS  score                         equals 9. The terminal ileum, ileocecal valve,                         appendiceal orifice, and rectum were photographed. Findings:      The perianal and digital rectal examinations were normal. Pertinent       negatives include normal sphincter tone and no palpable rectal lesions.      The terminal ileum appeared normal.      A 6 mm polyp was found in the ascending colon. The polyp was sessile.       The polyp was removed with a cold snare. Resection and retrieval were       complete. Estimated blood loss: none.      The retroflexed view of the distal rectum and anal verge was normal and       showed no anal or rectal abnormalities.      The exam was otherwise without abnormality. Impression:            - The examined portion of the ileum was normal.                        - One 6 mm polyp in the ascending colon, removed with                         a cold snare. Resected and retrieved.                        - The distal rectum and anal verge are normal on  retroflexion view.                        - The examination was otherwise normal. Recommendation:        - Discharge patient to home (with escort).                        - Resume previous diet today.                        - Continue present medications.                        - Await pathology results.                        - Repeat colonoscopy in 5 years for surveillance.                        - Resume Eliquis (apixaban) tomorrow and Lovenox                         (enoxaparin) tonight at prior doses. Refer to managing                         physician for further adjustment of therapy. Procedure Code(s):     --- Professional ---                        [phone], Colonoscopy, flexible; with removal of                         tumor(s), polyp(s), or other lesion(s) by snare                         technique Diagnosis Code(s):     --- Professional ---                         Z12.11, Encounter for screening for malignant neoplasm                         of colon                        D12.2, Benign neoplasm of ascending colon CPT copyright 2022 American Medical Association. All rights reserved. The codes documented in this report are preliminary and upon coder review may  be revised to meet current compliance requirements. Dr. Libby Maw Toney Reil MD, MD 06/21/2022 10:33:20 AM This report has been signed electronically. Number of Addenda: 0 Note Initiated On: 06/21/2022 9:47 AM Scope Withdrawal Time: 0 hours 11 minutes 25 seconds  Total Procedure Duration: 0 hours 15 minutes 31 seconds  Estimated Blood Loss:  Estimated blood loss: none.      [facility]

## 2022-06-21 NOTE — Anesthesia Preprocedure Evaluation (Signed)
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 Normal LV wall motion. EF 45-50%. NORMAL RIGHT VENTRICULAR SYSTOLIC FUNCTION  MILD VALVULAR REGURGITATION (MILD MR, TR)  NO VALVULAR STENOSIS     Neuro/Psych  Headaches PSYCHIATRIC DISORDERS Anxiety Depression    negative neurological ROS  negative psych ROS   GI/Hepatic negative GI ROS, Neg liver ROS, PUD,GERD  ,,  Endo/Other  negative endocrine ROSHypothyroidism    Renal/GU negative Renal ROS Aneurysm of right renal artery  negative genitourinary   Musculoskeletal  (+) Arthritis ,    Abdominal   Peds  Hematology negative hematology ROS (+) Blood dyscrasia (Eliquis), anemia   Anesthesia Other Findings Cardiology note from 05/27/22: ASSESSMENT/PLAN: Delightful 52 year old woman with no significant cardiovascular history who had an 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 Blood pressure is already controlled off antihypertensive medications and the patient was on triple therapy for SMA stent placement but is now just on ASA +apixaban. She has had chest to pelvis imaging as well as CTA neck which did not show evidence of fibromuscular dysplasia but she does have carotid tortuosity. Discussed in detail with the patient that when there is an area of beading elsewhere, areas of tortuosity in a patient with FMD can be considered FMD involvement. She had MRA of the brain which did not show any evidence of brain aneurysm. Note that she does have a grandparent with brain aneurysm and hemorrhagic stroke. Beighton score was normal, however, she does have spontaneous dissection as well as wide based scars. Referral to genetics today as several questions about family members. Discussed previously current international consensus recommendations for screening of family members who are symptomatic.  SMA dissection Status post SMA stent 04/25/21 RRA aneurysm She is currently on aspirin and apixaban under the direction of her vascular surgery team. She feels better and is able to eat more than she was prior to intervention. The underlying etiology of SMA dissection is likely to be fibromuscular dysplasia. Dissection precautions were discussed and there are several questions about exercise, which she likes to do. Will refer to sports cardiology. Recent duplex shows the stent to be patent and she will have scheduled surveillance of her mesentery with her vascular surgeons. Discussed that the threshold for repairing renal aneurysms is somewhere between 2 to 3 cm, usually. Renal aneurysm is stable by CTA abdomen 04/21/22. Query need for full dose  anticoagulation vs  antiplatelet therapy for her SMA stent going forward. She does not have indication for a/c otherwise.  TIA/neurologic event She had not neurologic event with imbalance and leaning to the left earlier in 2024. Her neurologist felt that she had a TIA and brain MRI did not show evidence of stroke. She also has a new right sided posterior neck pain, in addition to her chronic neck pain symptoms. Will obtain CTA neck to evaluate for new dissection. Continue with antiplatelet and she is also on anticoagulation for SMA dissection and stent. In the absence of dissection, etiology of TIA would be unclear given she was on full dose a/c at the time.    Past Medical History: No date: Abdominal pain, right upper quadrant No date: Allergic rhinitis, cause unspecified No date: Allergy-induced asthma No date: Anemia No date: Aneurysm of right renal artery (HCC) No date: Anxiety No date: Arthritis No date: Depressive disorder, not elsewhere classified No date: Dizziness and giddiness No date: Esophageal reflux No date: Fibromuscular dysplasia (HCC) No date: Gallstones No date: Headache No date: Liver lesion No date: Nontoxic multinodular goiter No date: Other chronic sinusitis No date: Other malaise and fatigue No date: Pain in limb No date: Peptic ulcer, unspecified site, unspecified as acute or  chronic, without mention of hemorrhage, perforation, or obstruction No date: Premenstrual tension syndromes No date: Pure hypercholesterolemia No date: TIA (transient ischemic attack) No date: Unspecified hypothyroidism  Past Surgical History: No date: 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     Comment:  with 2 stents 06/01/2015: BREAST BIOPSY; Right     Comment:  stereo, path pending 01/08/2000: DILATION AND CURETTAGE OF UTERUS 12/06/2021: DILITATION & CURRETTAGE/HYSTROSCOPY WITH NOVASURE  ABLATION; N/A     Comment:  Procedure: DILATATION & CURETTAGE/HYSTEROSCOPY WITH                NOVASURE ABLATION;  Surgeon: Huel Cote, MD;                Location: [facility] OR;  Service: Gynecology;  Laterality: N/A; 01/07/1998: LEEP  BMI    Body Mass Index: 21.58 kg/m      Reproductive/Obstetrics negative OB ROS menorrhagia                             Anesthesia Physical Anesthesia Plan  ASA: 3  Anesthesia Plan: General   Post-op Pain Management: Tylenol PO (pre-op)*   Induction: Intravenous  PONV Risk Score and Plan: 4 or greater and Scopolamine patch - Pre-op, Midazolam, Dexamethasone and Ondansetron  Airway Management Planned: Natural Airway and Nasal Cannula  Additional Equipment:   Intra-op Plan:   Post-operative Plan: Extubation in OR  Informed Consent: I have reviewed the patients History and Physical, chart, labs and discussed the procedure including the risks, benefits and alternatives for the proposed anesthesia with the patient or authorized representative who has indicated his/her understanding and acceptance.     Dental Advisory Given  Plan Discussed with: Anesthesiologist, CRNA and Surgeon  Anesthesia Plan Comments: (Patient consented for risks of anesthesia including but not limited to:  - adverse reactions to medications - risk of airway placement if required - damage to eyes, teeth, lips or other oral mucosa - nerve damage due to positioning  - sore throat or hoarseness - Damage to heart, brain, nerves, lungs, other parts of body or loss of life  Patient voiced understanding.)       Anesthesia Quick Evaluation

## 2022-06-21 NOTE — Anesthesia Postprocedure Evaluation (Signed)
Anesthesia Post Note  Patient: Isabel Foley  Procedure(s) Performed: COLONOSCOPY WITH PROPOFOL ESOPHAGOGASTRODUODENOSCOPY (EGD) WITH PROPOFOL  Patient location during evaluation: Endoscopy Anesthesia Type: General Level of consciousness: awake and alert Pain management: pain level controlled Vital Signs Assessment: post-procedure vital signs reviewed and stable Respiratory status: spontaneous breathing, nonlabored ventilation, respiratory function stable and patient connected to nasal cannula oxygen Cardiovascular status: blood pressure returned to baseline and stable Postop Assessment: no apparent nausea or vomiting Anesthetic complications: no  No notable events documented.   Last Vitals:  Vitals:   06/21/22 1045 06/21/22 1054  BP: (!) 89/56 112/75  Pulse: 67 63  Resp: 19 14  Temp:    SpO2: 100% 100%    Last Pain:  Vitals:   06/21/22 1054  TempSrc:   PainSc: 0-No pain                 Stephanie Coup

## 2022-06-21 NOTE — Transfer of Care (Signed)
Immediate Anesthesia Transfer of Care Note  Patient: Isabel Foley  Procedure(s) Performed: COLONOSCOPY WITH PROPOFOL ESOPHAGOGASTRODUODENOSCOPY (EGD) WITH PROPOFOL  Patient Location: PACU and Endoscopy Unit  Anesthesia Type:General  Level of Consciousness: unresponsive  Airway & Oxygen Therapy: Patient Spontanous Breathing  Post-op Assessment: Report given to RN and Post -op Vital signs reviewed and stable  Post vital signs: Reviewed and stable  Last Vitals:  Vitals Value Taken Time  BP 74/44 06/21/22 1035  Temp    Pulse 66 06/21/22 1035  Resp 15 06/21/22 1035  SpO2 98 % 06/21/22 1035  Vitals shown include unvalidated device data.  Last Pain:  Vitals:   06/21/22 1035  TempSrc:   PainSc: Asleep         Complications: No notable events documented.

## 2022-06-24 ENCOUNTER — Encounter: Payer: Self-pay | Admitting: Gastroenterology

## 2022-06-25 ENCOUNTER — Encounter: Payer: Self-pay | Admitting: Family

## 2022-06-25 ENCOUNTER — Encounter: Payer: Self-pay | Admitting: Family Medicine

## 2022-06-25 ENCOUNTER — Ambulatory Visit (INDEPENDENT_AMBULATORY_CARE_PROVIDER_SITE_OTHER): Payer: 59 | Admitting: Family

## 2022-06-25 VITALS — BP 118/62 | HR 100 | Temp 98.6°F | Ht 61.0 in | Wt 114.0 lb

## 2022-06-25 DIAGNOSIS — J020 Streptococcal pharyngitis: Secondary | ICD-10-CM | POA: Diagnosis not present

## 2022-06-25 DIAGNOSIS — Z20828 Contact with and (suspected) exposure to other viral communicable diseases: Secondary | ICD-10-CM

## 2022-06-25 DIAGNOSIS — R509 Fever, unspecified: Secondary | ICD-10-CM

## 2022-06-25 DIAGNOSIS — Z20822 Contact with and (suspected) exposure to covid-19: Secondary | ICD-10-CM | POA: Diagnosis not present

## 2022-06-25 DIAGNOSIS — J3489 Other specified disorders of nose and nasal sinuses: Secondary | ICD-10-CM

## 2022-06-25 LAB — POC INFLUENZA A&B (BINAX/QUICKVUE)
Influenza A, POC: NEGATIVE
Influenza B, POC: NEGATIVE

## 2022-06-25 LAB — POC COVID19 BINAXNOW: SARS Coronavirus 2 Ag: NEGATIVE

## 2022-06-25 LAB — POCT RAPID STREP A (OFFICE): Rapid Strep A Screen: POSITIVE — AB

## 2022-06-25 MED ORDER — AMOXICILLIN-POT CLAVULANATE 875-125 MG PO TABS
1.0000 | ORAL_TABLET | Freq: Two times a day (BID) | ORAL | 0 refills | Status: DC
Start: 2022-06-25 — End: 2022-08-20

## 2022-06-25 MED ORDER — FLUCONAZOLE 150 MG PO TABS: 150.0000 mg | ORAL_TABLET | Freq: Once | ORAL | 0 refills | Status: AC

## 2022-06-25 NOTE — Progress Notes (Signed)
Established Patient Office Visit  Subjective:   Patient ID: Isabel Foley, female    DOB: 04-28-1970  Age: 52 y.o. MRN: [id number]  CC:  Chief Complaint  Patient presents with   Cough    Off and on for over a month with fever yesterday.     HPI: Isabel Foley is a 52 y.o. female presenting on 06/25/2022 for Cough (Off and on for over a month with fever yesterday. )   Four days ago started with chest tightness, and chest congestion. She has noted some rattling in her chest. She does state she always has right sinus pressure however slightly increased pain on palpation today.  Last night started with a fever of 102 F. Took some coricidin with some relief of symptoms.  Denies sore throat. Stabbing pain bil ears.  Does c/o body aches.   Tested for covid at home and was negative.             ROS: Negative unless specifically indicated above in HPI.   Relevant past medical history reviewed and updated as indicated.   Allergies and medications reviewed and updated.   Current Outpatient Medications:    acetaminophen (TYLENOL) 500 MG tablet, Take 1,000 mg by mouth every 6 (six) hours as needed for moderate pain., Disp: , Rfl:    albuterol (VENTOLIN HFA) 108 (90 Base) MCG/ACT inhaler, Inhale 1-2 puffs into the lungs every 6 (six) hours as needed for wheezing or shortness of breath., Disp: , Rfl:    ALPRAZolam (XANAX) 0.5 MG tablet, Can use  1 tab daily  as needed for anxiety on long trips., Disp: 20 tablet, Rfl: 0   amoxicillin-clavulanate (AUGMENTIN) 875-125 MG tablet, Take 1 tablet by mouth 2 (two) times daily., Disp: 20 tablet, Rfl: 0   apixaban (ELIQUIS) 5 MG TABS tablet, Take 5 mg by mouth 2 (two) times daily., Disp: , Rfl:    aspirin 81 MG chewable tablet, Chew 81 mg by mouth daily., Disp: , Rfl:    azelastine (OPTIVAR) 0.05 % ophthalmic solution, Place 1 drop into both eyes 2 (two) times daily as needed (allergies)., Disp: , Rfl:    cetirizine (ZYRTEC) 10 MG tablet,  Take 10 mg by mouth daily., Disp: , Rfl:    Cholecalciferol (DIALYVITE VITAMIN D 5000) 125 MCG (5000 UT) capsule, Take 5,000 Units by mouth daily., Disp: , Rfl:    COLLAGEN PO, Take 1 Scoop by mouth daily., Disp: , Rfl:    cyclobenzaprine (FLEXERIL) 5 MG tablet, Take 5 mg by mouth 3 (three) times daily as needed for muscle spasms., Disp: , Rfl:    diphenhydrAMINE (BENADRYL) 25 MG tablet, Take 25 mg by mouth at bedtime as needed for itching., Disp: , Rfl:    fexofenadine (ALLEGRA) 180 MG tablet, Take 180 mg by mouth daily as needed for allergies., Disp: , Rfl: 5   fluticasone (FLONASE) 50 MCG/ACT nasal spray, Place 2 sprays into both nostrils daily as needed for allergies., Disp: , Rfl:    hydrocortisone cream 1 %, Apply 1 Application topically 2 (two) times daily as needed for itching., Disp: , Rfl:    montelukast (SINGULAIR) 10 MG tablet, Take 10 mg by mouth at bedtime., Disp: , Rfl:    Multiple Vitamin (MULTIVITAMIN) tablet, Take 1 tablet by mouth daily., Disp: , Rfl:    PREBIOTIC PRODUCT PO, Take 1 capsule by mouth daily., Disp: , Rfl:    Probiotic Product (PROBIOTIC FORMULA PO), Take 1 tablet by mouth daily., Disp: , Rfl:  Ubrogepant (UBRELVY) 100 MG TABS, Take 1 tablet by mouth daily as needed (For Migraines). Can repeat dose after 72 hours, Disp: , Rfl:    zonisamide (ZONEGRAN) 25 MG capsule, Take 25 mg by mouth daily. (Patient not taking: Reported on 04/18/2022), Disp: , Rfl:   Allergies  Allergen Reactions   Molds & Smuts Shortness Of Breath and Other (See Comments)    wheezing   Ciprofloxacin Other (See Comments)    Cx due to dissection can increase risk    Levaquin [Levofloxacin] Other (See Comments)    Cx due to dissection can increase risk     Objective:   BP 118/62   Pulse 100   Temp 98.6 F (37 C) (Temporal)   Ht 5\' 1"  (1.549 m)   Wt 114 lb (51.7 kg)   SpO2 98%   BMI 21.54 kg/m    Physical Exam Constitutional:      General: She is not in acute distress.     Appearance: Normal appearance. She is normal weight. She is not ill-appearing, toxic-appearing or diaphoretic.  HENT:     Head: Normocephalic.     Right Ear: Tympanic membrane normal.     Left Ear: Tympanic membrane normal.     Nose:     Right Sinus: Maxillary sinus tenderness and frontal sinus tenderness present.     Mouth/Throat:     Mouth: Mucous membranes are dry.     Pharynx: No oropharyngeal exudate or posterior oropharyngeal erythema.  Eyes:     Extraocular Movements: Extraocular movements intact.     Pupils: Pupils are equal, round, and reactive to light.  Cardiovascular:     Rate and Rhythm: Normal rate and regular rhythm.     Pulses: Normal pulses.     Heart sounds: Normal heart sounds.  Pulmonary:     Effort: Pulmonary effort is normal.     Breath sounds: Normal breath sounds.  Musculoskeletal:     Cervical back: Normal range of motion.  Neurological:     General: No focal deficit present.     Mental Status: She is alert and oriented to person, place, and time. Mental status is at baseline.  Psychiatric:        Mood and Affect: Mood normal.        Behavior: Behavior normal.        Thought Content: Thought content normal.        Judgment: Judgment normal.          Assessment & Plan:  Fever, unspecified fever cause -     POCT rapid strep A  Exposure to the flu -     POC Influenza A&B(BINAX/QUICKVUE)  Exposure to COVID-19 virus -     POC COVID-19 BinaxNow  Strep pharyngitis Assessment & Plan: Strep tested positive in office.  rx augmentin 875/125 mg po bid x 10 days Ibuprofen/tyelnol prn sore throat/fever Pt told to F/u if no improvement in the next 2-3 days.   Orders: -     Amoxicillin-Pot Clavulanate; Take 1 tablet by mouth 2 (two) times daily.  Dispense: 20 tablet; Refill: 0  Sinus pressure -     Amoxicillin-Pot Clavulanate; Take 1 tablet by mouth 2 (two) times daily.  Dispense: 20 tablet; Refill: 0     Follow up plan: Return if symptoms  worsen or fail to improve, for f/u PCP if no improvement in symptoms.  Mort Sawyers, FNP

## 2022-06-25 NOTE — Assessment & Plan Note (Signed)
Strep tested positive in office.  rx augmentin 875/125 mg po bid x 10 days Ibuprofen/tyelnol prn sore throat/fever Pt told to F/u if no improvement in the next 2-3 days.  

## 2022-07-01 ENCOUNTER — Inpatient Hospital Stay: Payer: 59 | Attending: Internal Medicine

## 2022-07-01 ENCOUNTER — Inpatient Hospital Stay (HOSPITAL_BASED_OUTPATIENT_CLINIC_OR_DEPARTMENT_OTHER): Payer: 59 | Admitting: Internal Medicine

## 2022-07-01 ENCOUNTER — Encounter: Payer: Self-pay | Admitting: Internal Medicine

## 2022-07-01 VITALS — BP 109/78 | HR 84 | Temp 98.2°F | Resp 16 | Ht 61.0 in | Wt 115.8 lb

## 2022-07-01 DIAGNOSIS — K635 Polyp of colon: Secondary | ICD-10-CM | POA: Diagnosis not present

## 2022-07-01 DIAGNOSIS — D500 Iron deficiency anemia secondary to blood loss (chronic): Secondary | ICD-10-CM | POA: Diagnosis not present

## 2022-07-01 DIAGNOSIS — Z803 Family history of malignant neoplasm of breast: Secondary | ICD-10-CM | POA: Insufficient documentation

## 2022-07-01 DIAGNOSIS — Z7901 Long term (current) use of anticoagulants: Secondary | ICD-10-CM | POA: Diagnosis not present

## 2022-07-01 DIAGNOSIS — K769 Liver disease, unspecified: Secondary | ICD-10-CM | POA: Diagnosis not present

## 2022-07-01 DIAGNOSIS — R42 Dizziness and giddiness: Secondary | ICD-10-CM | POA: Insufficient documentation

## 2022-07-01 DIAGNOSIS — D509 Iron deficiency anemia, unspecified: Secondary | ICD-10-CM | POA: Diagnosis not present

## 2022-07-01 LAB — CBC WITH DIFFERENTIAL/PLATELET
Abs Immature Granulocytes: 0.07 10*3/uL (ref 0.00–0.07)
Basophils Absolute: 0.1 10*3/uL (ref 0.0–0.1)
Basophils Relative: 1 %
Eosinophils Absolute: 0.1 10*3/uL (ref 0.0–0.5)
Eosinophils Relative: 1 %
HCT: 37.6 % (ref 36.0–46.0)
Hemoglobin: 12.3 g/dL (ref 12.0–15.0)
Immature Granulocytes: 1 %
Lymphocytes Relative: 20 %
Lymphs Abs: 1.4 10*3/uL (ref 0.7–4.0)
MCH: 32.1 pg (ref 26.0–34.0)
MCHC: 32.7 g/dL (ref 30.0–36.0)
MCV: 98.2 fL (ref 80.0–100.0)
Monocytes Absolute: 0.6 10*3/uL (ref 0.1–1.0)
Monocytes Relative: 9 %
Neutro Abs: 4.8 10*3/uL (ref 1.7–7.7)
Neutrophils Relative %: 68 %
Platelets: 377 10*3/uL (ref 150–400)
RBC: 3.83 MIL/uL — ABNORMAL LOW (ref 3.87–5.11)
RDW: 12.4 % (ref 11.5–15.5)
WBC: 7 10*3/uL (ref 4.0–10.5)
nRBC: 0 % (ref 0.0–0.2)

## 2022-07-01 LAB — FERRITIN: Ferritin: 245 ng/mL (ref 11–307)

## 2022-07-01 LAB — IRON AND TIBC
Iron: 85 ug/dL (ref 28–170)
Saturation Ratios: 32 % — ABNORMAL HIGH (ref 10.4–31.8)
TIBC: 266 ug/dL (ref 250–450)
UIBC: 181 ug/dL

## 2022-07-02 ENCOUNTER — Encounter: Payer: Self-pay | Admitting: Oncology

## 2022-07-02 NOTE — Progress Notes (Signed)
[facility]  [phone] [phone]  ID: Isabel Foley OB: 07-Dec-1970  MR#: [id number]  UVO#:[id number]  Patient Care Team: Excell Seltzer, MD as PCP - General   HPI: Isabel Foley is a 52 y.o. female with past medical history of gallstones, hypothyroidism, hyperlipidemia and fibromuscular dysplasia causing SMA dissection follows at [facility] was referred to hematology for management of iron deficiency anemia.  Patient reports history of heavy menses.  Since she had SMA dissection she was placed on triple therapy with aspirin, Plavix and Eliquis in February 2023.  Now she is on aspirin and Eliquis only.  Her periods have gotten heavier with blood thinners.  Denies any bleeding in stools or urine.  Her stool occult was negative. Most recent labs showed hemoglobin of 8.8, ferritin 2 and saturation 3.8%.    12/06/21- s/p uterine ablation for heavy vaginal bleeding  06/2022 -endoscopy with Dr. Allegra Lai was unremarkable.  Colonoscopy showed a small polyp in ascending colon pathology showed hyperplastic polyp.  Last IV Venofer in March 2024  Interval history- Patient was seen today for iron deficiency anemia follow-up and labs. She was recently diagnosed with strep throat and is on antibiotics.  Her symptoms are improving. Had MRI brain on 04/28/2022 for dizziness.  Showed some paranasal sinus disease and small volume fluid within the left mastoid air cells.  Had an MRI liver with and without contrast with Dr. Allegra Lai.  Enhancing 2.5 cm lesion in the hepatic dome.  Repeat in 6 months was recommended.  She continues on Eliquis.  Denies any abnormal vaginal bleeding or in urine or stools.  REVIEW OF SYSTEMS:   Review of Systems  Neurological:  Positive for dizziness.    As per HPI. Otherwise, a complete review of systems is negative.  PAST MEDICAL HISTORY: Past Medical History:  Diagnosis Date   Abdominal pain, right upper quadrant    Allergic rhinitis,  cause unspecified    Allergy-induced asthma    Anemia    Aneurysm of right renal artery (HCC)    Anxiety    Arthritis    Depressive disorder, not elsewhere classified    Dizziness and giddiness    Esophageal reflux    Fibromuscular dysplasia (HCC)    Gallstones    Headache    Liver lesion    Nontoxic multinodular goiter    Other chronic sinusitis    Other malaise and fatigue    Pain in limb    Peptic ulcer, unspecified site, unspecified as acute or chronic, without mention of hemorrhage, perforation, or obstruction    Premenstrual tension syndromes    Pure hypercholesterolemia    TIA (transient ischemic attack)    Unspecified hypothyroidism     PAST SURGICAL HISTORY: Past Surgical History:  Procedure Laterality Date   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     with 2 stents   BREAST BIOPSY Right 06/01/2015   stereo, path pending   COLONOSCOPY WITH PROPOFOL N/A 06/21/2022   Procedure: COLONOSCOPY WITH PROPOFOL;  Surgeon: Toney Reil, MD;  Location: [facility] ENDOSCOPY;  Service: Gastroenterology;  Laterality: N/A;   DILATION AND CURETTAGE OF UTERUS  01/08/2000   DILITATION & CURRETTAGE/HYSTROSCOPY WITH NOVASURE ABLATION N/A 12/06/2021   Procedure: DILATATION & CURETTAGE/HYSTEROSCOPY WITH NOVASURE ABLATION;  Surgeon: Huel Cote, MD;  Location: [facility] OR;  Service: Gynecology;  Laterality: N/A;   ESOPHAGOGASTRODUODENOSCOPY (EGD) WITH PROPOFOL N/A 06/21/2022   Procedure: ESOPHAGOGASTRODUODENOSCOPY (EGD) WITH PROPOFOL;  Surgeon: Toney Reil, MD;  Location: [facility] ENDOSCOPY;  Service: Gastroenterology;  Laterality: N/A;   LEEP  01/07/1998    FAMILY HISTORY: Family History  Problem Relation Age of Onset   Hyperthyroidism Mother    Irritable bowel syndrome Mother    Colon polyps Father    Hypertension Father    Hypothyroidism Father    Other Sister        Collagen Vascular Disease   Heart disease Brother    Atrial fibrillation Brother 20   Breast cancer  Cousin     HEALTH MAINTENANCE: Social History   Tobacco Use   Smoking status: Never   Smokeless tobacco: Never  Vaping Use   Vaping Use: Never used  Substance Use Topics   Alcohol use: No   Drug use: No     Allergies  Allergen Reactions   Molds & Smuts Shortness Of Breath and Other (See Comments)    wheezing   Ciprofloxacin Other (See Comments)    Cx due to dissection can increase risk    Levaquin [Levofloxacin] Other (See Comments)    Cx due to dissection can increase risk     Current Outpatient Medications  Medication Sig Dispense Refill   acetaminophen (TYLENOL) 500 MG tablet Take 1,000 mg by mouth every 6 (six) hours as needed for moderate pain.     albuterol (VENTOLIN HFA) 108 (90 Base) MCG/ACT inhaler Inhale 1-2 puffs into the lungs every 6 (six) hours as needed for wheezing or shortness of breath.     ALPRAZolam (XANAX) 0.5 MG tablet Can use  1 tab daily  as needed for anxiety on long trips. 20 tablet 0   amoxicillin-clavulanate (AUGMENTIN) 875-125 MG tablet Take 1 tablet by mouth 2 (two) times daily. 20 tablet 0   apixaban (ELIQUIS) 5 MG TABS tablet Take 5 mg by mouth 2 (two) times daily.     aspirin 81 MG chewable tablet Chew 81 mg by mouth daily.     azelastine (OPTIVAR) 0.05 % ophthalmic solution Place 1 drop into both eyes 2 (two) times daily as needed (allergies).     cetirizine (ZYRTEC) 10 MG tablet Take 10 mg by mouth daily.     Cholecalciferol (DIALYVITE VITAMIN D 5000) 125 MCG (5000 UT) capsule Take 5,000 Units by mouth daily.     COLLAGEN PO Take 1 Scoop by mouth daily.     cyclobenzaprine (FLEXERIL) 5 MG tablet Take 5 mg by mouth 3 (three) times daily as needed for muscle spasms.     diphenhydrAMINE (BENADRYL) 25 MG tablet Take 25 mg by mouth at bedtime as needed for itching.     fexofenadine (ALLEGRA) 180 MG tablet Take 180 mg by mouth daily as needed for allergies.  5   fluticasone (FLONASE) 50 MCG/ACT nasal spray Place 2 sprays into both nostrils daily  as needed for allergies.     hydrocortisone cream 1 % Apply 1 Application topically 2 (two) times daily as needed for itching.     montelukast (SINGULAIR) 10 MG tablet Take 10 mg by mouth at bedtime.     Multiple Vitamin (MULTIVITAMIN) tablet Take 1 tablet by mouth daily.     PREBIOTIC PRODUCT PO Take 1 capsule by mouth daily.     Probiotic Product (PROBIOTIC FORMULA PO) Take 1 tablet by mouth daily.     Ubrogepant (UBRELVY) 100 MG TABS Take 1 tablet by mouth daily as needed (For Migraines). Can repeat dose after 72 hours     zonisamide (ZONEGRAN) 25 MG capsule Take 25 mg by mouth daily. (Patient not  taking: Reported on 04/18/2022)     No current facility-administered medications for this visit.    OBJECTIVE: Vitals:   07/01/22 1336  BP: 109/78  Pulse: 84  Resp: 16  Temp: 98.2 F (36.8 C)  SpO2: 100%     Body mass index is 21.88 kg/m.      Physical exam not performed   LAB RESULTS:  Lab Results  Component Value Date   NA 138 12/06/2021   K 3.6 12/06/2021   CL 108 12/06/2021   CO2 25 12/06/2021   GLUCOSE 73 12/06/2021   BUN 12 12/06/2021   CREATININE 0.72 12/06/2021   CALCIUM 8.6 (L) 12/06/2021   PROT 7.0 05/29/2021   ALBUMIN 4.0 05/29/2021   AST 27 05/29/2021   ALT 30 05/29/2021   ALKPHOS 60 05/29/2021   BILITOT 0.4 05/29/2021   GFRNONAA >60 12/06/2021   GFRAA 98 08/05/2019    Lab Results  Component Value Date   WBC 7.0 07/01/2022   NEUTROABS 4.8 07/01/2022   HGB 12.3 07/01/2022   HCT 37.6 07/01/2022   MCV 98.2 07/01/2022   PLT 377 07/01/2022    Lab Results  Component Value Date   TIBC 266 07/01/2022   TIBC 352.8 02/20/2022   TIBC 357 01/28/2022   FERRITIN 245 07/01/2022   FERRITIN 18.0 02/20/2022   FERRITIN 32 01/28/2022   IRONPCTSAT 32 (H) 07/01/2022   IRONPCTSAT 20.7 02/20/2022   IRONPCTSAT 35 (H) 01/28/2022     STUDIES: No results found.  ASSESSMENT AND PLAN:   Isabel Foley is a 52 y.o. female with pmh of of gallstones,  hypothyroidism, hyperlipidemia and fibromuscular dysplasia causing SMA dissection follows at [facility] was referred to hematology for management of iron deficiency anemia.  #Iron Deficiency anemia -Likely secondary to heavy menstrual period worsened with blood thinners. S/p uterine ablation in November 2023.  -She was treated with IV Venofer x 5 doses.  On repeat, her ferritin had decreased from 32-18.  She was evaluated by Clent Jacks.  She was treated with 5 more IV Feraheme 200 mg infusions.  And was also referred to GI to rule out any occult GI bleed.  Endoscopy was unremarkable.  Colonoscopy showed small polyp in the ascending colon.  Pathology showed hyperplastic polyp.  Hemoglobin today normal at 12.3.  Iron panel is pending.  I do not anticipate her needing more iron at this time.  I will follow-up with her again in 6 months to recheck her levels.  # Fibromuscular dysplasia # History of SMA dissection -Her FMD specialist at [facility].  She follows with vascular and cardiology also.   -On aspirin and Eliquis  # Liver lesion - Had an MRI liver with and without contrast with Dr. Allegra Lai.  Enhancing 2.5 cm lesion in the hepatic dome.  Repeat in 6 months was recommended.  Defer to GI  Orders Placed This Encounter  Procedures   CBC with Differential/Platelet   Iron and TIBC   Ferritin    RTC in 6 months for MD visit, labs  Patient expressed understanding and was in agreement with this plan. She also understands that She can call clinic at any time with any questions, concerns, or complaints.   I spent a total of 30 minutes reviewing chart data, face-to-face evaluation with the patient, counseling and coordination of care as detailed above.  Michaelyn Barter, MD   07/02/2022 10:20 AM

## 2022-07-15 ENCOUNTER — Encounter: Payer: Self-pay | Admitting: Family Medicine

## 2022-08-06 ENCOUNTER — Other Ambulatory Visit: Payer: Self-pay | Admitting: Family Medicine

## 2022-08-06 NOTE — Telephone Encounter (Signed)
Last office visit 06/25/2022 with Dugal for cough.  Last refilled 05/23/2022 for #20 with no refills.  Next appt: No future appointments with PCP.

## 2022-08-17 ENCOUNTER — Other Ambulatory Visit: Payer: Self-pay | Admitting: Dermatology

## 2022-08-20 ENCOUNTER — Encounter: Payer: Self-pay | Admitting: Family Medicine

## 2022-08-20 ENCOUNTER — Ambulatory Visit (INDEPENDENT_AMBULATORY_CARE_PROVIDER_SITE_OTHER): Payer: 59 | Admitting: Family Medicine

## 2022-08-20 VITALS — BP 110/70 | HR 73 | Temp 98.5°F | Ht 61.0 in | Wt 120.1 lb

## 2022-08-20 DIAGNOSIS — J3489 Other specified disorders of nose and nasal sinuses: Secondary | ICD-10-CM | POA: Diagnosis not present

## 2022-08-20 MED ORDER — PREDNISONE 20 MG PO TABS: ORAL_TABLET | ORAL | 0 refills | Status: DC

## 2022-08-20 NOTE — Progress Notes (Signed)
Patient ID: Isabel Foley, female    DOB: 10-01-1970, 52 y.o.   MRN: [id number]  This visit was conducted in person.  BP 110/70 (BP Location: Left Arm, Patient Position: Sitting, Cuff Size: Normal)   Pulse 73   Temp 98.5 F (36.9 C) (Temporal)   Ht 5\' 1"  (1.549 m)   Wt 120 lb 2 oz (54.5 kg)   SpO2 99%   BMI 22.70 kg/m    CC:  Chief Complaint  Patient presents with   Cough    Lingering cough when lays down in bed-Seen [place] in June and does not feel she ever got over everything    Nasal Congestion   Ear Pain    Right   Facial Pain    Subjective:   HPI: Isabel Foley is a 52 y.o. female presenting on 08/20/2022 for Cough (Lingering cough when lays down in bed-Seen [place] in June and does not feel she ever got over everything/), Nasal Congestion, Ear Pain (Right), and Facial Pain   Reviewed office visit from June 25, 2022 with Mort Sawyers. Positive for strep at that office visit, negative for flu and COVID Treated with Augmentin.  She reports she feels like she never returned to her baseline.  She has noted a lingering cough when she lays down at night. She also has recurrence   in last week nasal congestion right ear pain and right facial pain.  New pain in right teeth.  Stabbing pain in right ear.  Has  tried mucinex, flonase.   Had episode of lighthededness and ear pain lasted seconds.   No fever, no SOB.   CT neck...right lung nodule likely granuloma. 07/2022  MRI brain 04/2022 Paral nasal sinus disease.   Relevant past medical, surgical, family and social history reviewed and updated as indicated. Interim medical history since our last visit reviewed. Allergies and medications reviewed and updated. Outpatient Medications Prior to Visit  Medication Sig Dispense Refill   acetaminophen (TYLENOL) 500 MG tablet Take 1,000 mg by mouth every 6 (six) hours as needed for moderate pain.     albuterol (VENTOLIN HFA) 108 (90 Base) MCG/ACT inhaler Inhale 1-2  puffs into the lungs every 6 (six) hours as needed for wheezing or shortness of breath.     ALPRAZolam (XANAX) 0.5 MG tablet CAN USE 1 TAB DAILY AS NEEDED FOR ANXIETY ON LONG TRIPS. 20 tablet 0   apixaban (ELIQUIS) 5 MG TABS tablet Take 5 mg by mouth 2 (two) times daily.     aspirin 81 MG chewable tablet Chew 81 mg by mouth daily.     azelastine (OPTIVAR) 0.05 % ophthalmic solution Place 1 drop into both eyes 2 (two) times daily as needed (allergies).     cetirizine (ZYRTEC) 10 MG tablet Take 10 mg by mouth daily.     Cholecalciferol (DIALYVITE VITAMIN D 5000) 125 MCG (5000 UT) capsule Take 5,000 Units by mouth daily.     COLLAGEN PO Take 1 Scoop by mouth daily.     cyclobenzaprine (FLEXERIL) 5 MG tablet Take 5 mg by mouth 3 (three) times daily as needed for muscle spasms.     diphenhydrAMINE (BENADRYL) 25 MG tablet Take 25 mg by mouth at bedtime as needed for itching.     fexofenadine (ALLEGRA) 180 MG tablet Take 180 mg by mouth daily as needed for allergies.  5   fluticasone (FLONASE) 50 MCG/ACT nasal spray Place 2 sprays into both nostrils daily as needed for allergies.  hydrocortisone cream 1 % Apply 1 Application topically 2 (two) times daily as needed for itching.     levothyroxine (SYNTHROID) 25 MCG tablet Take 25 mcg by mouth every morning.     montelukast (SINGULAIR) 10 MG tablet Take 10 mg by mouth at bedtime.     Multiple Vitamin (MULTIVITAMIN) tablet Take 1 tablet by mouth daily.     PREBIOTIC PRODUCT PO Take 1 capsule by mouth daily.     Probiotic Product (PROBIOTIC FORMULA PO) Take 1 tablet by mouth daily.     Ubrogepant (UBRELVY) 100 MG TABS Take 1 tablet by mouth daily as needed (For Migraines). Can repeat dose after 72 hours     amoxicillin-clavulanate (AUGMENTIN) 875-125 MG tablet Take 1 tablet by mouth 2 (two) times daily. 20 tablet 0   zonisamide (ZONEGRAN) 25 MG capsule Take 25 mg by mouth daily. (Patient not taking: Reported on 04/18/2022)     No facility-administered  medications prior to visit.     Per HPI unless specifically indicated in ROS section below Review of Systems  Constitutional:  Negative for fatigue and fever.  HENT:  Positive for congestion, ear pain, postnasal drip and sinus pain.   Eyes:  Negative for pain.  Respiratory:  Negative for cough and shortness of breath.   Cardiovascular:  Negative for chest pain, palpitations and leg swelling.  Gastrointestinal:  Negative for abdominal pain.  Genitourinary:  Negative for dysuria and vaginal bleeding.  Musculoskeletal:  Negative for back pain.  Neurological:  Negative for syncope, light-headedness and headaches.  Psychiatric/Behavioral:  Negative for dysphoric mood.    Objective:  BP 110/70 (BP Location: Left Arm, Patient Position: Sitting, Cuff Size: Normal)   Pulse 73   Temp 98.5 F (36.9 C) (Temporal)   Ht 5\' 1"  (1.549 m)   Wt 120 lb 2 oz (54.5 kg)   SpO2 99%   BMI 22.70 kg/m   Wt Readings from Last 3 Encounters:  08/20/22 120 lb 2 oz (54.5 kg)  07/01/22 115 lb 12.8 oz (52.5 kg)  06/25/22 114 lb (51.7 kg)      Physical Exam Constitutional:      General: She is not in acute distress.    Appearance: Normal appearance. She is well-developed. She is not ill-appearing or toxic-appearing.  HENT:     Head: Normocephalic.     Right Ear: Hearing, tympanic membrane, ear canal and external ear normal. Tympanic membrane is not erythematous, retracted or bulging.     Left Ear: Hearing, tympanic membrane, ear canal and external ear normal. Tympanic membrane is not erythematous, retracted or bulging.     Nose: No mucosal edema or rhinorrhea.     Right Turbinates: Swollen.     Left Turbinates: Swollen.     Right Sinus: No maxillary sinus tenderness or frontal sinus tenderness.     Left Sinus: No maxillary sinus tenderness or frontal sinus tenderness.     Mouth/Throat:     Mouth: Oropharynx is clear and moist and mucous membranes are normal.     Pharynx: Uvula midline.  Eyes:      General: Lids are normal. Lids are everted, no foreign bodies appreciated.     Extraocular Movements: EOM normal.     Conjunctiva/sclera: Conjunctivae normal.     Pupils: Pupils are equal, round, and reactive to light.  Neck:     Thyroid: No thyroid mass or thyromegaly.     Vascular: No carotid bruit.     Trachea: Trachea normal.  Cardiovascular:  Rate and Rhythm: Normal rate and regular rhythm.     Pulses: Normal pulses.     Heart sounds: Normal heart sounds, S1 normal and S2 normal. No murmur heard.    No friction rub. No gallop.  Pulmonary:     Effort: Pulmonary effort is normal. No tachypnea or respiratory distress.     Breath sounds: Normal breath sounds. No decreased breath sounds, wheezing, rhonchi or rales.  Abdominal:     General: Bowel sounds are normal.     Palpations: Abdomen is soft.     Tenderness: There is no abdominal tenderness.  Musculoskeletal:     Cervical back: Normal range of motion and neck supple.  Skin:    General: Skin is warm, dry and intact.     Findings: No rash.  Neurological:     Mental Status: She is alert.  Psychiatric:        Mood and Affect: Mood is not anxious or depressed.        Speech: Speech normal.        Behavior: Behavior normal. Behavior is cooperative.        Thought Content: Thought content normal.        Cognition and Memory: Cognition and memory normal.        Judgment: Judgment normal.       Results for orders placed or performed in visit on 07/01/22  Ferritin  Result Value Ref Range   Ferritin 245 11 - 307 ng/mL  Iron and TIBC  Result Value Ref Range   Iron 85 28 - 170 ug/dL   TIBC 782 956 - 213 ug/dL   Saturation Ratios 32 (H) 10.4 - 31.8 %   UIBC 181 ug/dL  CBC with Differential/Platelet  Result Value Ref Range   WBC 7.0 4.0 - 10.5 K/uL   RBC 3.83 (L) 3.87 - 5.11 MIL/uL   Hemoglobin 12.3 12.0 - 15.0 g/dL   HCT 08.6 57.8 - 46.9 %   MCV 98.2 80.0 - 100.0 fL   MCH 32.1 26.0 - 34.0 pg   MCHC 32.7 30.0 - 36.0  g/dL   RDW 62.9 52.8 - 41.3 %   Platelets 377 150 - 400 K/uL   nRBC 0.0 0.0 - 0.2 %   Neutrophils Relative % 68 %   Neutro Abs 4.8 1.7 - 7.7 K/uL   Lymphocytes Relative 20 %   Lymphs Abs 1.4 0.7 - 4.0 K/uL   Monocytes Relative 9 %   Monocytes Absolute 0.6 0.1 - 1.0 K/uL   Eosinophils Relative 1 %   Eosinophils Absolute 0.1 0.0 - 0.5 K/uL   Basophils Relative 1 %   Basophils Absolute 0.1 0.0 - 0.1 K/uL   Immature Granulocytes 1 %   Abs Immature Granulocytes 0.07 0.00 - 0.07 K/uL    Assessment and Plan  Sinus pressure  Other orders -     predniSONE; 3 tabs by mouth daily x 3 days, then 2 tabs by mouth daily x 2 days then 1 tab by mouth daily x 2 days  Dispense: 15 tablet; Refill: 0    No follow-ups on file.   Kerby Nora, MD

## 2022-08-22 ENCOUNTER — Encounter: Payer: Self-pay | Admitting: Family Medicine

## 2022-08-29 DIAGNOSIS — J3489 Other specified disorders of nose and nasal sinuses: Secondary | ICD-10-CM | POA: Insufficient documentation

## 2022-08-29 NOTE — Assessment & Plan Note (Signed)
Acute, no clear sign of ongoing continued infection.  There is evidence of continued inflammation and pressure in the sinuses.  Will treat with prednisone taper.  Return and ER precautions provided.

## 2022-09-02 ENCOUNTER — Encounter: Payer: Self-pay | Admitting: Gastroenterology

## 2022-09-02 ENCOUNTER — Encounter: Payer: Self-pay | Admitting: Family Medicine

## 2022-09-02 DIAGNOSIS — K769 Liver disease, unspecified: Secondary | ICD-10-CM

## 2022-09-12 ENCOUNTER — Encounter: Payer: Self-pay | Admitting: Family Medicine

## 2022-09-13 ENCOUNTER — Telehealth: Payer: Self-pay | Admitting: *Deleted

## 2022-09-13 DIAGNOSIS — N951 Menopausal and female climacteric states: Secondary | ICD-10-CM

## 2022-09-13 NOTE — Telephone Encounter (Signed)
-----   Message from Vincenza Hews sent at 09/13/2022  2:46 PM EDT ----- Regarding: Labs Monday 09/13/22 Hello,   Patient has a lab appointment on Monday 09/13/22. Can we get lab orders please.   Thanks

## 2022-09-13 NOTE — Telephone Encounter (Signed)
Orders placed but didn't know what diagnosis to use.

## 2022-09-16 ENCOUNTER — Other Ambulatory Visit (INDEPENDENT_AMBULATORY_CARE_PROVIDER_SITE_OTHER): Payer: 59

## 2022-09-16 DIAGNOSIS — N951 Menopausal and female climacteric states: Secondary | ICD-10-CM

## 2022-09-16 LAB — FOLLICLE STIMULATING HORMONE: FSH: 18.3 m[IU]/mL

## 2022-09-16 LAB — LUTEINIZING HORMONE: LH: 12.69 m[IU]/mL

## 2022-09-17 LAB — ESTRADIOL: Estradiol: 33 pg/mL

## 2022-10-30 ENCOUNTER — Encounter: Payer: Self-pay | Admitting: Family Medicine

## 2022-10-30 ENCOUNTER — Encounter: Payer: Self-pay | Admitting: Oncology

## 2022-10-30 DIAGNOSIS — R41840 Attention and concentration deficit: Secondary | ICD-10-CM

## 2022-10-30 DIAGNOSIS — F411 Generalized anxiety disorder: Secondary | ICD-10-CM

## 2022-11-05 ENCOUNTER — Encounter: Payer: Self-pay | Admitting: Oncology

## 2022-11-08 ENCOUNTER — Ambulatory Visit: Payer: 59

## 2022-11-08 ENCOUNTER — Ambulatory Visit
Admission: RE | Admit: 2022-11-08 | Discharge: 2022-11-08 | Disposition: A | Discharge status: Home / Self Care | Payer: 59 | Source: Ambulatory Visit | Attending: Gastroenterology

## 2022-11-08 DIAGNOSIS — K769 Liver disease, unspecified: Secondary | ICD-10-CM | POA: Diagnosis present

## 2022-11-08 MED ORDER — GADOXETATE DISODIUM 0.25 MMOL/ML IV SOLN
5.0000 mL | Freq: Once | INTRAVENOUS | Status: AC | PRN
  Administered 2022-11-08: 5 mL via INTRAVENOUS

## 2022-11-10 ENCOUNTER — Encounter: Payer: Self-pay | Admitting: Gastroenterology

## 2022-11-25 ENCOUNTER — Ambulatory Visit (INDEPENDENT_AMBULATORY_CARE_PROVIDER_SITE_OTHER): Payer: 59 | Admitting: Psychology

## 2022-11-25 DIAGNOSIS — F4310 Post-traumatic stress disorder, unspecified: Secondary | ICD-10-CM | POA: Diagnosis not present

## 2022-11-25 DIAGNOSIS — F411 Generalized anxiety disorder: Secondary | ICD-10-CM | POA: Diagnosis not present
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
## 2022-11-26 ENCOUNTER — Encounter: Payer: Self-pay | Admitting: Psychology

## 2022-12-08 ENCOUNTER — Other Ambulatory Visit: Payer: Self-pay | Admitting: Family Medicine

## 2022-12-09 NOTE — Telephone Encounter (Signed)
Last office visit 08/20/2022 for sinus pressure.  Last refilled 08/06/2022 for #20 with no refills.  Next Appt: No future appointments with PCP.

## 2022-12-10 ENCOUNTER — Ambulatory Visit: Payer: 59 | Admitting: Dermatology

## 2022-12-10 DIAGNOSIS — L578 Other skin changes due to chronic exposure to nonionizing radiation: Secondary | ICD-10-CM | POA: Diagnosis not present

## 2022-12-10 DIAGNOSIS — D492 Neoplasm of unspecified behavior of bone, soft tissue, and skin: Secondary | ICD-10-CM | POA: Diagnosis not present

## 2022-12-10 DIAGNOSIS — L918 Other hypertrophic disorders of the skin: Secondary | ICD-10-CM

## 2022-12-10 DIAGNOSIS — W908XXA Exposure to other nonionizing radiation, initial encounter: Secondary | ICD-10-CM | POA: Diagnosis not present

## 2022-12-10 DIAGNOSIS — D225 Melanocytic nevi of trunk: Secondary | ICD-10-CM

## 2022-12-10 DIAGNOSIS — D1801 Hemangioma of skin and subcutaneous tissue: Secondary | ICD-10-CM

## 2022-12-10 DIAGNOSIS — D2239 Melanocytic nevi of other parts of face: Secondary | ICD-10-CM

## 2022-12-10 DIAGNOSIS — L719 Rosacea, unspecified: Secondary | ICD-10-CM

## 2022-12-10 DIAGNOSIS — D2362 Other benign neoplasm of skin of left upper limb, including shoulder: Secondary | ICD-10-CM | POA: Diagnosis not present

## 2022-12-10 DIAGNOSIS — Z1283 Encounter for screening for malignant neoplasm of skin: Secondary | ICD-10-CM | POA: Diagnosis not present

## 2022-12-10 DIAGNOSIS — L814 Other melanin hyperpigmentation: Secondary | ICD-10-CM

## 2022-12-10 DIAGNOSIS — D239 Other benign neoplasm of skin, unspecified: Secondary | ICD-10-CM

## 2022-12-10 DIAGNOSIS — L821 Other seborrheic keratosis: Secondary | ICD-10-CM

## 2022-12-10 DIAGNOSIS — D485 Neoplasm of uncertain behavior of skin: Secondary | ICD-10-CM

## 2022-12-10 DIAGNOSIS — M898X9 Other specified disorders of bone, unspecified site: Secondary | ICD-10-CM

## 2022-12-10 DIAGNOSIS — L309 Dermatitis, unspecified: Secondary | ICD-10-CM

## 2022-12-10 DIAGNOSIS — D229 Melanocytic nevi, unspecified: Secondary | ICD-10-CM

## 2022-12-10 DIAGNOSIS — L988 Other specified disorders of the skin and subcutaneous tissue: Secondary | ICD-10-CM

## 2022-12-10 MED ORDER — METRONIDAZOLE 0.75 % EX CREA: TOPICAL_CREAM | CUTANEOUS | 3 refills | Status: DC

## 2022-12-10 MED ORDER — MOMETASONE FUROATE 0.1 % EX CREA
TOPICAL_CREAM | CUTANEOUS | 1 refills | Status: DC
Start: 2022-12-10 — End: 2023-02-03

## 2022-12-10 NOTE — Patient Instructions (Addendum)
Wound Care Instructions  Cleanse wound gently with soap and water once a day then pat dry with clean gauze. Apply a thin coat of Petrolatum (petroleum jelly, "Vaseline") over the wound (unless you have an allergy to this). We recommend that you use a new, sterile tube of Vaseline. Do not pick or remove scabs. Do not remove the yellow or white "healing tissue" from the base of the wound.  Cover the wound with fresh, clean, nonstick gauze and secure with paper tape. You may use Band-Aids in place of gauze and tape if the wound is small enough, but would recommend trimming much of the tape off as there is often too much. Sometimes Band-Aids can irritate the skin.  You should call the office for your biopsy report after 1 week if you have not already been contacted.  If you experience any problems, such as abnormal amounts of bleeding, swelling, significant bruising, significant pain, or evidence of infection, please call the office immediately.  FOR ADULT SURGERY PATIENTS: If you need something for pain relief you may take 1 extra strength Tylenol (acetaminophen) AND 2 Ibuprofen (200mg  each) together every 4 hours as needed for pain. (do not take these if you are allergic to them or if you have a reason you should not take them.) Typically, you may only need pain medication for 1 to 3 days.     Melanoma ABCDEs  Melanoma is the most dangerous type of skin cancer, and is the leading cause of death from skin disease.  You are more likely to develop melanoma if you: Have light-colored skin, light-colored eyes, or red or blond hair Spend a lot of time in the sun Tan regularly, either outdoors or in a tanning bed Have had blistering sunburns, especially during childhood Have a close family member who has had a melanoma Have atypical moles or large birthmarks  Early detection of melanoma is key since treatment is typically straightforward and cure rates are extremely high if we catch it early.   The  first sign of melanoma is often a change in a mole or a new dark spot.  The ABCDE system is a way of remembering the signs of melanoma.  A for asymmetry:  The two halves do not match. B for border:  The edges of the growth are irregular. C for color:  A mixture of colors are present instead of an even brown color. D for diameter:  Melanomas are usually (but not always) greater than 6mm - the size of a pencil eraser. E for evolution:  The spot keeps changing in size, shape, and color.  Please check your skin once per month between visits. You can use a small mirror in front and a large mirror behind you to keep an eye on the back side or your body.   If you see any new or changing lesions before your next follow-up, please call to schedule a visit.  Please continue daily skin protection including broad spectrum sunscreen SPF 30+ to sun-exposed areas, reapplying every 2 hours as needed when you're outdoors.   Staying in the shade or wearing long sleeves, sun glasses (UVA+UVB protection) and wide brim hats (4-inch brim around the entire circumference of the hat) are also recommended for sun protection.    Due to recent changes in healthcare laws, you may see results of your pathology and/or laboratory studies on MyChart before the doctors have had a chance to review them. We understand that in some cases there may be  results that are confusing or concerning to you. Please understand that not all results are received at the same time and often the doctors may need to interpret multiple results in order to provide you with the best plan of care or course of treatment. Therefore, we ask that you please give [place] 2 business days to thoroughly review all your results before contacting the office for clarification. Should we see a critical lab result, you will be contacted sooner.   If You Need Anything After Your Visit  If you have any questions or concerns for your doctor, please call our main line at  [phone] and press option 4 to reach your doctor's medical assistant. If no one answers, please leave a voicemail as directed and we will return your call as soon as possible. Messages left after 4 pm will be answered the following business day.   You may also send [place] a message via MyChart. We typically respond to MyChart messages within 1-2 business days.  For prescription refills, please ask your pharmacy to contact our office. Our fax number is [phone].  If you have an urgent issue when the clinic is closed that cannot wait until the next business day, you can page your doctor at the number below.    Please note that while we do our best to be available for urgent issues outside of office hours, we are not available 24/7.   If you have an urgent issue and are unable to reach [place], you may choose to seek medical care at your doctor's office, retail clinic, urgent care center, or emergency room.  If you have a medical emergency, please immediately call 911 or go to the emergency department.  Pager Numbers  - Dr. Gwen Pounds: [phone]  - Dr. Roseanne Reno: [phone]  - Dr. Katrinka Blazing: [phone]   In the event of inclement weather, please call our main line at [phone] for an update on the status of any delays or closures.  Dermatology Medication Tips: Please keep the boxes that topical medications come in in order to help keep track of the instructions about where and how to use these. Pharmacies typically print the medication instructions only on the boxes and not directly on the medication tubes.   If your medication is too expensive, please contact our office at [phone] option 4 or send [place] a message through MyChart.   We are unable to tell what your co-pay for medications will be in advance as this is different depending on your insurance coverage. However, we may be able to find a substitute medication at lower cost or fill out paperwork to get insurance to cover a needed  medication.   If a prior authorization is required to get your medication covered by your insurance company, please allow [place] 1-2 business days to complete this process.  Drug prices often vary depending on where the prescription is filled and some pharmacies may offer cheaper prices.  The website www.goodrx.com contains coupons for medications through different pharmacies. The prices here do not account for what the cost may be with help from insurance (it may be cheaper with your insurance), but the website can give you the price if you did not use any insurance.  - You can print the associated coupon and take it with your prescription to the pharmacy.  - You may also stop by our office during regular business hours and pick up a GoodRx coupon card.  - If you need your prescription sent electronically to a  different pharmacy, notify our office through [facility] or by phone at [phone] option 4.     Si Usted Necesita Algo Despus de Su Visita  Tambin puede enviarnos un mensaje a travs de Clinical cytogeneticist. Por lo general respondemos a los mensajes de MyChart en el transcurso de 1 a 2 das hbiles.  Para renovar recetas, por favor pida a su farmacia que se ponga en contacto con nuestra oficina. Annie Sable de fax es [place] [phone].  Si tiene un asunto urgente cuando la clnica est cerrada y que no puede esperar hasta el siguiente da hbil, puede llamar/localizar a su doctor(a) al nmero que aparece a continuacin.   Por favor, tenga en cuenta que aunque hacemos todo lo posible para estar disponibles para asuntos urgentes fuera del horario de [place], no estamos disponibles las 24 horas del da, los 7 [address]  [address] de la [place].   Si tiene un problema urgente y no puede comunicarse con nosotros, puede optar por buscar atencin mdica  en el consultorio de su doctor(a), en una clnica privada, en un centro de atencin urgente o en una sala de emergencias.  Si tiene Engineer, drilling,  por favor llame inmediatamente al 911 o vaya a la sala de emergencias.  Nmeros de bper  - Dr. Gwen Pounds: [phone]  - Dra. Roseanne Reno: [phone]  - Dr. Katrinka Blazing: [phone]   En caso de inclemencias del tiempo, por favor llame a Lacy Duverney principal al [phone] para una actualizacin sobre el [place] de cualquier retraso o cierre.  Consejos para la medicacin en dermatologa: Por favor, guarde las cajas en las que vienen los medicamentos de uso tpico para ayudarle a seguir las instrucciones sobre dnde y cmo usarlos. Las farmacias generalmente imprimen las instrucciones del medicamento slo en las cajas y no directamente en los tubos del [place].   Si su medicamento es muy caro, por favor, pngase en contacto con Rolm Gala llamando al [phone] y presione la opcin 4 o envenos un mensaje a travs de Clinical cytogeneticist.   No podemos decirle cul ser su copago por los medicamentos por adelantado ya que esto es diferente dependiendo de la cobertura de su seguro. Sin embargo, es posible que podamos encontrar un medicamento sustituto a Audiological scientist un formulario para que el seguro cubra el medicamento que se considera necesario.   Si se requiere una autorizacin previa para que su compaa de seguros [place] su medicamento, por favor permtanos de 1 a 2 das hbiles para completar [address]  Los precios de los medicamentos varan con frecuencia dependiendo del Environmental consultant de dnde se surte la receta y alguna farmacias pueden ofrecer precios ms baratos.  El sitio web www.goodrx.com tiene cupones para medicamentos de Health and safety inspector. Los precios aqu no tienen en cuenta lo que podra costar con la ayuda del seguro (puede ser ms barato con su seguro), pero el sitio web puede darle el precio si no utiliz Tourist information centre manager.  - Puede imprimir el cupn correspondiente y llevarlo con su receta a la farmacia.  - Tambin puede pasar por nuestra oficina durante el horario de atencin  regular y Education officer, museum una tarjeta de cupones de GoodRx.  - Si necesita que su receta se enve electrnicamente a una farmacia diferente, informe a nuestra oficina a travs de MyChart de [place] o por telfono llamando al [phone] y presione la opcin 4.

## 2022-12-10 NOTE — Progress Notes (Signed)
Follow-Up Visit   Subjective  Isabel Foley is a 52 y.o. female who presents for the following: Skin Cancer Screening and Full Body Skin Exam  The patient presents for Total-Body Skin Exam (TBSE) for skin cancer screening and mole check. The patient has spots, moles and lesions to be evaluated, some may be new or changing.  She has a new spot on her left upper arm that is sore to the touch. She has a history of dermatitis and would like a refill of mometasone cream.   Patient with Fibromuscular Dysplasia causing aortic/arterial dissection.    The following portions of the chart were reviewed this encounter and updated as appropriate: medications, allergies, medical history  Review of Systems:  No other skin or systemic complaints except as noted in HPI or Assessment and Plan.  Objective  Well appearing patient in no apparent distress; mood and affect are within normal limits.  A full examination was performed including scalp, head, eyes, ears, nose, lips, neck, chest, axillae, abdomen, back, buttocks, bilateral upper extremities, bilateral lower extremities, hands, feet, fingers, toes, fingernails, and toenails. All findings within normal limits unless otherwise noted below.   Relevant physical exam findings are noted in the Assessment and Plan.  left upper arm 3.0 mm firm flesh papule       Right mid jaw 3.0 mm flesh papule      R pretibia     Assessment & Plan   SKIN CANCER SCREENING PERFORMED TODAY.  ACTINIC DAMAGE - Chronic condition, secondary to cumulative UV/sun exposure - diffuse scaly erythematous macules with underlying dyspigmentation - Recommend daily broad spectrum sunscreen SPF 30+ to sun-exposed areas, reapply every 2 hours as needed.  - Staying in the shade or wearing long sleeves, sun glasses (UVA+UVB protection) and wide brim hats (4-inch brim around the entire circumference of the hat) are also recommended for sun protection.  - Call for new or  changing lesions.  LENTIGINES, SEBORRHEIC KERATOSES, HEMANGIOMAS - Benign normal skin lesions - Benign-appearing - Call for any changes  MELANOCYTIC NEVI - Tan-brown and/or pink-flesh-colored symmetric macules and papules - 3.78mm med dark brown macule left post ankle/lower calf, stable compared to note in [employer]. - 2.5 mm med dark brown macule right lower abdomen - 2.5 mm med brown macule spinal lower back - 4.0 mm med brown macule darker edge right pretibia, photo today, recheck on f/up - Benign appearing on exam today - Observation - Call clinic for new or changing moles - Recommend daily use of broad spectrum spf 30+ sunscreen to sun-exposed areas.   Dermatitis  Exam: Arms clear today  Chronic condition with duration or expected duration over one year. Currently well-controlled.   Atopic dermatitis (eczema) is a chronic, relapsing, pruritic condition that can significantly affect quality of life. It is often associated with allergic rhinitis and/or asthma and can require treatment with topical medications, phototherapy, or in severe cases biologic injectable medication (Dupixent; Adbry) or Oral JAK inhibitors.   Treatment Plan: Continue mometasone cream every day/bid prn flares  Recommend mild soap and moisturizing cream 1-2 times daily.     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 Pt states she gets small bumps in area.  Chronic and persistent condition with duration or expected duration over one year. Condition is symptomatic/ bothersome to patient. Not currently at goal.   Treatment Plan: Start metronidazole 0.75% cream once to twice daily to AA  dsp 45g 3Rf.  Recommend daily broad spectrum sunscreen SPF 30+ to sun-exposed areas, reapply every 2 hours as needed. Call for new or changing lesions.  Staying in the shade or wearing long sleeves, sun glasses (UVA+UVB protection) and wide brim  hats (4-inch brim around the entire circumference of the hat) are also recommended for sun protection.   BONY PROMINENCE vs OTHER  Exam: 1.5 cm fixed firm SQ nodule at left forehead, present for years, pt feels like it has grown some.  Treatment: Benign-appearing.  Observation.  Call clinic for new or changing lesions. Patient sees Dr Jenne Campus and will have him examine area and eval for surgical removal if indicated.   Acrochordons (Skin Tags) - Fleshy, skin-colored pedunculated papules - Benign appearing.  - Observe. - If desired, they can be removed with an in office procedure that is not covered by insurance. - Please call the clinic if you notice any new or changing lesions.  DERMATOFIBROMA Exam: Firm pink/brown papulenodule with dimple sign at left lateral thigh, left anterior ankle, right hip.  Treatment Plan: A dermatofibroma is a benign growth possibly related to trauma, such as an insect bite, cut from shaving, or inflamed acne-type bump.  Treatment options to remove include shave or excision with resulting scar and risk of recurrence.  Since benign-appearing and not bothersome, will observe for now.   Neoplasm of uncertain behavior of skin (2) left upper arm  Epidermal / dermal shaving  Lesion diameter (cm):  0.3 Informed consent: discussed and consent obtained   Patient was prepped and draped in usual sterile fashion: Area prepped with alcohol. Anesthesia: the lesion was anesthetized in a standard fashion   Anesthetic:  1% lidocaine w/ epinephrine 1-100,000 buffered w/ 8.4% NaHCO3 Instrument used: flexible razor blade   Hemostasis achieved with: pressure, aluminum chloride and electrodesiccation   Outcome: patient tolerated procedure well   Post-procedure details: wound care instructions given   Post-procedure details comment:  Ointment and small bandage applied  Specimen 1 - Surgical pathology Differential Diagnosis: Dermatofibroma vs Irritated Nevus Check Margins:  No  Right mid jaw  Epidermal / dermal shaving  Lesion diameter (cm):  0.3 Informed consent: discussed and consent obtained   Patient was prepped and draped in usual sterile fashion: Area prepped with alcohol. Anesthesia: the lesion was anesthetized in a standard fashion   Anesthetic:  1% lidocaine w/ epinephrine 1-100,000 buffered w/ 8.4% NaHCO3 Instrument used: flexible razor blade   Hemostasis achieved with: pressure, aluminum chloride and electrodesiccation   Outcome: patient tolerated procedure well   Post-procedure details: wound care instructions given   Post-procedure details comment:  Ointment and small bandage applied  Specimen 2 - Surgical pathology Differential Diagnosis: Nevus vs Sebaceous Hyperplasia Check Margins: No  Discussed resulting small scar with shave removal, and possible recurrence of lesion.  Recommend vaseline ointment to area daily and cover until healed.  Recommend photoprotection/sunscreen to area to prevent discoloration of scar.  Once healed, may apply OTC Serica scar gel bid to thickened scars.    Return in about 1 year (around 12/10/2023) for TBSE.  ICherlyn Labella, CMA, am acting as scribe for Willeen Niece, MD .   Documentation: I have reviewed the above documentation for accuracy and completeness, and I agree with the above.  Willeen Niece, MD

## 2022-12-11 LAB — SURGICAL PATHOLOGY

## 2022-12-16 ENCOUNTER — Ambulatory Visit: Payer: 59 | Admitting: Psychology

## 2022-12-16 ENCOUNTER — Telehealth: Payer: Self-pay | Admitting: Family Medicine

## 2022-12-16 ENCOUNTER — Encounter: Payer: Self-pay | Admitting: Family Medicine

## 2022-12-16 ENCOUNTER — Encounter: Payer: Self-pay | Admitting: Oncology

## 2022-12-16 ENCOUNTER — Telehealth: Payer: Self-pay

## 2022-12-16 DIAGNOSIS — F411 Generalized anxiety disorder: Secondary | ICD-10-CM | POA: Diagnosis not present

## 2022-12-16 DIAGNOSIS — F4310 Post-traumatic stress disorder, unspecified: Secondary | ICD-10-CM | POA: Diagnosis not present

## 2022-12-16 NOTE — Telephone Encounter (Signed)
-----   Message from Willeen Niece sent at 12/16/2022  1:23 PM EST ----- 1. Skin, left upper arm :       SURFACE OF A DERMATOFIBROMA   2. Skin, right mid jaw :       MELANOCYTIC NEVUS, INTRADERMAL TYPE, IRRITATED   1. Benign dermatofibroma, may recur A dermatofibroma is a benign growth possibly related to trauma, such as an insect bite, cut from shaving, or inflamed acne-type bump.  2. Benign irritated mole - please call patient

## 2022-12-16 NOTE — Telephone Encounter (Signed)
I called the pt about a message she left on her son and she wanted to make Dr. Ermalene Searing aware of her BP readings today. This morning when she checked it it was 115/82. Right before I called her she had checked it and it was 136/112, while on the phone I asked her to check it again and it was reading 135/84. Pt is wanting someone to be aware of these readings due to her history of dissection of mesenteric artery and wanted to know if there was anything she needed to worry about or watch out for because of her past medical history.

## 2022-12-16 NOTE — Telephone Encounter (Signed)
Advised patient biopsies of the left upper arm and right mid jaw were both benign and no further treatment needed.

## 2022-12-16 NOTE — Telephone Encounter (Signed)
I think this can wait until Dr. B gets here in the morning.

## 2022-12-16 NOTE — Progress Notes (Signed)
[facility] Counselor/Therapist Progress Note  Patient ID: Isabel Foley, MRN: [id number],    Date: 12/16/2022  Time Spent: 10:00am-11:04am   Pt is seen for a virtual video visit.  Pt joins from her home reporting privacy, and counselor from her home office.  Pt consents to virtual visit and is aware of limitations of such visits.    Treatment Type: Individual Therapy  Reported Symptoms: anxiety w/ driving, ruminating worries  Mental Status Exam: Appearance:  Well Groomed     Behavior: Appropriate  Motor: Normal  Speech/Language:  Clear and Coherent and Normal Rate  Affect: Appropriate  Mood: anxious  Thought process: normal  Thought content:   WNL  Sensory/Perceptual disturbances:   WNL  Orientation: oriented to person, place, time/date, and situation  Attention: Good  Concentration: Good  Memory: WNL  Fund of knowledge:  Good  Insight:   Good  Judgment:  Good  Impulse Control: Good   Risk Assessment: Danger to Self:  No Self-injurious Behavior: No Danger to Others: No Duty to Warn:no Physical Aggression / Violence:No  Access to Firearms a concern: No  Gang Involvement:No   Subjective: Counselor assessed pt current functioning per pt report.  Processed w/ pt positives and stressors.  Explored anxiety and discussed ruminating worries.  Assisted pt w/ planning for building grounding skills to assister in distress tolerance.  Pt to take inventory of current grounding skills using.  Pt affect wnl.  Pt reported that continues w/ anxiety and rumination on worries.  Pt reported busy month w/ appointments and holidays.  Pt reports tomorrow trip to [facility] for doctor appointments to f/u on FMD.  Pt discussed avoidance of highways when she is driving and that husband will be driving tomorrow.  Pt would like to work towards decreasing anxiety w/ driving.  Pt agrees to identified current grounding techniques and be intentional about using consistently.     Interventions: Cognitive Behavioral Therapy and Psycho-education/Bibliotherapy  Diagnosis:GAD (generalized anxiety disorder)  PTSD (post-traumatic stress disorder)  Plan: Pt to f/u in 2 weeks w/ counseling.  Pt to f/u as scheduled w/ PCP and specialists.   Individualized Treatment Plan Strengths: enjoys travel and time w/ family  Supports: husband, friends/church community   Goal/Needs for Treatment:  In order of importance to patient 1) cope w/ anxiety 2) -- 3) ---   Client Statement of Needs: "Coping w/ the anxiety"   Treatment Level:outpatient counseling  Symptoms:anxiety/on edge, difficulty w/ worry, mind constantly going.  If slows down fatigue.    Client Treatment Preferences:biweekly counseling.  Continue  w/ PCP and specialists.   Healthcare consumer's goal for treatment:  Counselor, [name], [facility] will support the patient's ability to achieve the goals identified. Cognitive Behavioral Therapy, Assertive Communication/Conflict Resolution Training, Relaxation Training, ACT, Humanistic and other evidenced-based practices will be used to promote progress towards healthy functioning.   Healthcare consumer will: Actively participate in therapy, working towards healthy functioning.    *Justification for Continuation/Discontinuation of Goal: R=Revised, O=Ongoing, A=Achieved, D=Discontinued  Goal 1) Increase use of relaxation/mindfulness and descalation skills to reduce and cop w/ symptoms of anxiety.   Baseline date 11/25/22: Progress towards goal 0; How Often - Daily Target Date Goal Was reviewed Status Code Progress towards goal/Likert rating  11/25/23                Goal 2) Identify, challenge, and replace biased, fearful self-talk with positive, realistic, and empowering self-talk. Baseline date 11/25/22: Progress towards goal 0; How Often - Daily Target  Date Goal Was reviewed Status Code Progress towards goal  11/25/23                This plan has been  reviewed and created by the following participants:  This plan will be reviewed at least every 12 months. Date Behavioral Health Clinician Date Guardian/Patient   11/25/22  [facility] [name] [facility] 11/25/22 Verbal Consent Provided                   [name] [facility]

## 2022-12-17 ENCOUNTER — Inpatient Hospital Stay: Payer: 59

## 2022-12-17 ENCOUNTER — Other Ambulatory Visit: Payer: Self-pay

## 2022-12-17 ENCOUNTER — Inpatient Hospital Stay: Payer: 59 | Attending: Internal Medicine

## 2022-12-17 ENCOUNTER — Encounter: Payer: Self-pay | Admitting: Oncology

## 2022-12-17 ENCOUNTER — Inpatient Hospital Stay (HOSPITAL_BASED_OUTPATIENT_CLINIC_OR_DEPARTMENT_OTHER): Payer: 59 | Admitting: Internal Medicine

## 2022-12-17 DIAGNOSIS — N920 Excessive and frequent menstruation with regular cycle: Secondary | ICD-10-CM | POA: Diagnosis not present

## 2022-12-17 DIAGNOSIS — D509 Iron deficiency anemia, unspecified: Secondary | ICD-10-CM | POA: Insufficient documentation

## 2022-12-17 DIAGNOSIS — Z7901 Long term (current) use of anticoagulants: Secondary | ICD-10-CM | POA: Insufficient documentation

## 2022-12-17 DIAGNOSIS — Z803 Family history of malignant neoplasm of breast: Secondary | ICD-10-CM | POA: Diagnosis not present

## 2022-12-17 DIAGNOSIS — K7689 Other specified diseases of liver: Secondary | ICD-10-CM

## 2022-12-17 DIAGNOSIS — Z860109 Personal history of other colon polyps: Secondary | ICD-10-CM | POA: Insufficient documentation

## 2022-12-17 DIAGNOSIS — Z8679 Personal history of other diseases of the circulatory system: Secondary | ICD-10-CM | POA: Insufficient documentation

## 2022-12-17 DIAGNOSIS — D500 Iron deficiency anemia secondary to blood loss (chronic): Secondary | ICD-10-CM

## 2022-12-17 DIAGNOSIS — K769 Liver disease, unspecified: Secondary | ICD-10-CM | POA: Insufficient documentation

## 2022-12-17 DIAGNOSIS — Z7982 Long term (current) use of aspirin: Secondary | ICD-10-CM | POA: Diagnosis not present

## 2022-12-17 LAB — CBC WITH DIFFERENTIAL/PLATELET
Abs Immature Granulocytes: 0.04 10*3/uL (ref 0.00–0.07)
Basophils Absolute: 0.1 10*3/uL (ref 0.0–0.1)
Basophils Relative: 1 %
Eosinophils Absolute: 0.2 10*3/uL (ref 0.0–0.5)
Eosinophils Relative: 2 %
HCT: 42.1 % (ref 36.0–46.0)
Hemoglobin: 14.1 g/dL (ref 12.0–15.0)
Immature Granulocytes: 0 %
Lymphocytes Relative: 18 %
Lymphs Abs: 1.6 10*3/uL (ref 0.7–4.0)
MCH: 32.4 pg (ref 26.0–34.0)
MCHC: 33.5 g/dL (ref 30.0–36.0)
MCV: 96.8 fL (ref 80.0–100.0)
Monocytes Absolute: 0.9 10*3/uL (ref 0.1–1.0)
Monocytes Relative: 10 %
Neutro Abs: 6.2 10*3/uL (ref 1.7–7.7)
Neutrophils Relative %: 69 %
Platelets: 287 10*3/uL (ref 150–400)
RBC: 4.35 MIL/uL (ref 3.87–5.11)
RDW: 13.1 % (ref 11.5–15.5)
WBC: 9.1 10*3/uL (ref 4.0–10.5)
nRBC: 0 % (ref 0.0–0.2)

## 2022-12-17 LAB — HEPATITIS B CORE ANTIBODY, TOTAL: Hep B Core Total Ab: NONREACTIVE

## 2022-12-17 LAB — IRON AND TIBC
Iron: 110 ug/dL (ref 28–170)
Saturation Ratios: 38 % — ABNORMAL HIGH (ref 10.4–31.8)
TIBC: 293 ug/dL (ref 250–450)
UIBC: 183 ug/dL

## 2022-12-17 LAB — FERRITIN: Ferritin: 125 ng/mL (ref 11–307)

## 2022-12-17 LAB — HEPATITIS B SURFACE ANTIGEN: Hepatitis B Surface Ag: NONREACTIVE

## 2022-12-17 NOTE — Telephone Encounter (Signed)
Please call to triage pt and see how she is feeling today

## 2022-12-17 NOTE — Progress Notes (Addendum)
[facility]  [phone] [phone]  ID: [name] OB: [birth year]  MR#: [id number]  NGE#:[id number]  Patient Care Team: Excell Seltzer, MD as PCP - General   HPI: Isabel Foley is a 52 y.o. female with past medical history of gallstones, hypothyroidism, hyperlipidemia and fibromuscular dysplasia causing SMA dissection follows at [facility] was referred to hematology for management of iron deficiency anemia.  Patient reports history of heavy menses.  Since she had SMA dissection she was placed on triple therapy with aspirin, Plavix and Eliquis in February 2023.  Now she is on aspirin and Eliquis only.  Her periods have gotten heavier with blood thinners.  Denies any bleeding in stools or urine.  Her stool occult was negative. Most recent labs showed hemoglobin of 8.8, ferritin 2 and saturation 3.8%.    12/06/21- s/p uterine ablation for heavy vaginal bleeding  06/2022 -endoscopy with Dr. Allegra Lai was unremarkable.  Colonoscopy showed a small polyp in ascending colon pathology showed hyperplastic polyp.  Last IV Venofer in March 2024  Interval history- Patient was seen today for iron deficiency anemia follow-up and labs. Since the last visit, she had some mild pain in her left leg.  On further workup was found to have mild dissection which is being monitored.  Inquiring about iron deposition in the liver.  REVIEW OF SYSTEMS:   Review of Systems  Neurological:  Positive for dizziness.    As per HPI. Otherwise, a complete review of systems is negative.  PAST MEDICAL HISTORY: Past Medical History:  Diagnosis Date   Abdominal pain, right upper quadrant    Allergic rhinitis, cause unspecified    Allergy-induced asthma    Anemia    Aneurysm of right renal artery (HCC)    Anxiety    Arthritis    Depressive disorder, not elsewhere classified    Dizziness and giddiness    Esophageal reflux    Fibromuscular dysplasia (HCC)    Gallstones     Headache    Liver lesion    Nontoxic multinodular goiter    Other chronic sinusitis    Other malaise and fatigue    Pain in limb    Peptic ulcer, unspecified site, unspecified as acute or chronic, without mention of hemorrhage, perforation, or obstruction    Premenstrual tension syndromes    Pure hypercholesterolemia    TIA (transient ischemic attack)    Unspecified hypothyroidism     PAST SURGICAL HISTORY: Past Surgical History:  Procedure Laterality Date   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     with 2 stents   BREAST BIOPSY Right 06/01/2015   stereo, path pending   COLONOSCOPY WITH PROPOFOL N/A 06/21/2022   Procedure: COLONOSCOPY WITH PROPOFOL;  Surgeon: Toney Reil, MD;  Location: [facility] ENDOSCOPY;  Service: Gastroenterology;  Laterality: N/A;   DILATION AND CURETTAGE OF UTERUS  01/08/2000   DILITATION & CURRETTAGE/HYSTROSCOPY WITH NOVASURE ABLATION N/A 12/06/2021   Procedure: DILATATION & CURETTAGE/HYSTEROSCOPY WITH NOVASURE ABLATION;  Surgeon: Huel Cote, MD;  Location: [facility] OR;  Service: Gynecology;  Laterality: N/A;   ESOPHAGOGASTRODUODENOSCOPY (EGD) WITH PROPOFOL N/A 06/21/2022   Procedure: ESOPHAGOGASTRODUODENOSCOPY (EGD) WITH PROPOFOL;  Surgeon: Toney Reil, MD;  Location: [facility] ENDOSCOPY;  Service: Gastroenterology;  Laterality: N/A;   LEEP  01/07/1998    FAMILY HISTORY: Family History  Problem Relation Age of Onset   Anxiety disorder Mother    Hyperthyroidism Mother    Irritable bowel syndrome Mother    Anxiety disorder Father  Colon polyps Father    Hypertension Father    Hypothyroidism Father    Anxiety disorder Sister    Other Sister        Collagen Vascular Disease   Anxiety disorder Brother    Heart disease Brother    Atrial fibrillation Brother 20   Breast cancer Cousin    ADD / ADHD Son    Anxiety disorder Son    ADD / ADHD Son    Anxiety disorder Daughter    ADD / ADHD Daughter     HEALTH MAINTENANCE: Social  History   Tobacco Use   Smoking status: Never   Smokeless tobacco: Never  Vaping Use   Vaping status: Never Used  Substance Use Topics   Alcohol use: No   Drug use: No     Allergies  Allergen Reactions   Molds & Smuts Shortness Of Breath and Other (See Comments)    wheezing   Ciprofloxacin Other (See Comments)    Cx due to dissection can increase risk    Levaquin [Levofloxacin] Other (See Comments)    Cx due to dissection can increase risk     Current Outpatient Medications  Medication Sig Dispense Refill   acetaminophen (TYLENOL) 500 MG tablet Take 1,000 mg by mouth every 6 (six) hours as needed for moderate pain.     albuterol (VENTOLIN HFA) 108 (90 Base) MCG/ACT inhaler Inhale 1-2 puffs into the lungs every 6 (six) hours as needed for wheezing or shortness of breath.     ALPRAZolam (XANAX) 0.5 MG tablet CAN USE 1 TAB DAILY AS NEEDED FOR ANXIETY ON LONG TRIPS. 20 tablet 0   apixaban (ELIQUIS) 5 MG TABS tablet Take 5 mg by mouth 2 (two) times daily.     aspirin 81 MG chewable tablet Chew 81 mg by mouth daily.     azelastine (OPTIVAR) 0.05 % ophthalmic solution Place 1 drop into both eyes 2 (two) times daily as needed (allergies).     cetirizine (ZYRTEC) 10 MG tablet Take 10 mg by mouth daily.     Cholecalciferol (DIALYVITE VITAMIN D 5000) 125 MCG (5000 UT) capsule Take 5,000 Units by mouth daily.     COLLAGEN PO Take 1 Scoop by mouth daily.     cyclobenzaprine (FLEXERIL) 5 MG tablet Take 5 mg by mouth 3 (three) times daily as needed for muscle spasms.     diphenhydrAMINE (BENADRYL) 25 MG tablet Take 25 mg by mouth at bedtime as needed for itching.     fexofenadine (ALLEGRA) 180 MG tablet Take 180 mg by mouth daily as needed for allergies.  5   fluticasone (FLONASE) 50 MCG/ACT nasal spray Place 2 sprays into both nostrils daily as needed for allergies.     Fluticasone Furoate (ARNUITY ELLIPTA) 100 MCG/ACT AEPB Inhale into the lungs.     hydrocortisone cream 1 % Apply 1  Application topically 2 (two) times daily as needed for itching.     ipratropium (ATROVENT) 0.06 % nasal spray Place into both nostrils.     levothyroxine (SYNTHROID) 25 MCG tablet Take 25 mcg by mouth every morning.     metroNIDAZOLE (METROCREAM) 0.75 % cream Apply to affected areas face once to twice daily for rosacea. 45 g 3   mometasone (ELOCON) 0.1 % cream Apply to affected areas rash on body once to twice daily as needed. Avoid applying to face, groin, and axilla. Use as directed. Long-term use can cause thinning of the skin. 45 g 1   montelukast (SINGULAIR)  10 MG tablet Take 10 mg by mouth at bedtime.     Multiple Vitamin (MULTIVITAMIN) tablet Take 1 tablet by mouth daily.     PREBIOTIC PRODUCT PO Take 1 capsule by mouth daily.     predniSONE (DELTASONE) 20 MG tablet Take by mouth.     Probiotic Product (PROBIOTIC FORMULA PO) Take 1 tablet by mouth daily.     Ubrogepant (UBRELVY) 100 MG TABS Take 1 tablet by mouth daily as needed (For Migraines). Can repeat dose after 72 hours     No current facility-administered medications for this visit.    OBJECTIVE: Vitals:   12/17/22 1314  BP: 107/86  Pulse: 95  Resp: 16  Temp: 98.3 F (36.8 C)  SpO2: 98%     Body mass index is 24 kg/m.      Physical exam not performed   LAB RESULTS:  Lab Results  Component Value Date   NA 138 12/06/2021   K 3.6 12/06/2021   CL 108 12/06/2021   CO2 25 12/06/2021   GLUCOSE 73 12/06/2021   BUN 12 12/06/2021   CREATININE 0.72 12/06/2021   CALCIUM 8.6 (L) 12/06/2021   PROT 7.0 05/29/2021   ALBUMIN 4.0 05/29/2021   AST 27 05/29/2021   ALT 30 05/29/2021   ALKPHOS 60 05/29/2021   BILITOT 0.4 05/29/2021   GFRNONAA >60 12/06/2021   GFRAA 98 08/05/2019    Lab Results  Component Value Date   WBC 9.1 12/17/2022   NEUTROABS 6.2 12/17/2022   HGB 14.1 12/17/2022   HCT 42.1 12/17/2022   MCV 96.8 12/17/2022   PLT 287 12/17/2022    Lab Results  Component Value Date   TIBC 293 12/17/2022    TIBC 266 07/01/2022   TIBC 352.8 02/20/2022   FERRITIN 125 12/17/2022   FERRITIN 245 07/01/2022   FERRITIN 18.0 02/20/2022   IRONPCTSAT 38 (H) 12/17/2022   IRONPCTSAT 32 (H) 07/01/2022   IRONPCTSAT 20.7 02/20/2022     STUDIES: No results found.  ASSESSMENT AND PLAN:   Isabel Foley is a 52 y.o. female with pmh of of gallstones, hypothyroidism, hyperlipidemia and fibromuscular dysplasia causing SMA dissection follows at [facility] was referred to hematology for management of iron deficiency anemia.  #Iron Deficiency anemia -Likely secondary to heavy menstrual period worsened with blood thinners. S/p uterine ablation in November 2023.  -She was treated with IV Venofer x 5 doses.  On repeat, her ferritin had decreased from 32-18.  She was evaluated by Clent Jacks.  She was treated with 5 more IV Feraheme 200 mg infusions.  And was also referred to GI to rule out any occult GI bleed.  Endoscopy was unremarkable.  Colonoscopy showed small polyp in the ascending colon.  Pathology showed hyperplastic polyp.   -Last iron was given in March 2024.  Her hemoglobin has been normal at 14.  Ferritin 125.  Saturation 38%.  Her bleeding source has been controlled and I do not anticipate need for further IV iron infusions.  I discussed about following with Dr. Ermalene Searing and monitoring of iron panel twice a year.  She was agreeable with the plan.  # Fibromuscular dysplasia # History of SMA dissection -Her FMD specialist at [facility].  She follows with vascular and cardiology also.   -On aspirin and Eliquis  # Liver lesion -MRI liver with and without contrast from November 2024 Showed unchanged lesion in the hepatic segment 8 measuring 2.5 x 2 cm.  Consistent with benign focal nodular hyperplasia.  No further follow-up  required.  Presence of hepatic parenchymal iron deposition.  It was also noted on the MRI from April 2024.  # Hepatic iron deposition -Noted on MRI liver done for liver lesion.  Unclear etiology.   Hepatitis C negative.  Will check for hepatitis B. Ferritin and iron saturation has usually been normal or low.  Is less likely to have hereditary hemochromatosis.  But because there is no clear underlying etiology I will check her for [facility].  Denies any significant alcohol use.  No obesity.  Does not take OTC iron supplements.  Inflammatory markers have been normal.  Has ANA positive and seen rheumatology with no clear diagnosis of any connective tissue disease.  -I will reach out to radiology about comparison of the scans from April and November to see if there is any worsening of iron deposition and need for further monitoring.  LFTs have been normal.  I do not see any indication for phlebotomies or iron chelation at this time.   Addendum-discussed MRI imaging with radiology.  Difficult to tell if iron deposition has progressed over the past 6 months since it is not a quantitative study.  Hemochromatosis gene panel is negative.  Hepatitis B/C nonreactive.  No alcohol use.  BMI within normal limits.  ESR has been normal in the past.  No apparent etiology of liver iron deposition.  Her iron panel and ferritin is within normal limit.  No clear indication for phlebotomy or iron chelating agent.  She can continue to follow with Dr. Ermalene Searing for monitoring of iron studies/ferritin and LFTs every 6 months.  She can be referred back to hematology if her iron levels are high.  Discussed with the patient.  I will send my note to Dr. Ermalene Searing.   Orders Placed This Encounter  Procedures   Hemochromatosis DNA-PCR(c282y,h63d)   Hepatitis B core antibody, total   Hepatitis B surface antigen   RTC as needed.  Patient expressed understanding and was in agreement with this plan. She also understands that She can call clinic at any time with any questions, concerns, or complaints.   I spent a total of 30 minutes reviewing chart data, face-to-face evaluation with the patient, counseling and coordination of care as  detailed above.  Michaelyn Barter, MD   12/17/2022 4:35 PM

## 2022-12-17 NOTE — Telephone Encounter (Signed)
Noted  

## 2022-12-17 NOTE — Telephone Encounter (Signed)
I spoke with pt; pt is presently having testing done prior to oncology appt today.pt said no abd pain or N&V. Pt having H/ A on and off; denied dizziness at this time. Pt scheduled appt with Dr Ermalene Searing for BP ck and pt said she has not seen Dr Ermalene Searing recently. Pt took BP earlier this morning but cannot remember reading. Pt said was 120 something / 90 something. Pt said BP would be taken at oncology appt this morning.pt scheduled appt with Dr Keene Breath on 12/19/22 at 12 noon; first appt that was OK for pts busy schedule. UC & ED precautions given and pt voiced understanding. Sending note to Dr Ermalene Searing.

## 2022-12-17 NOTE — Progress Notes (Signed)
Patient has a question about her liver having a lot of Iron sitting there, which showed up on her recent MRI.

## 2022-12-19 ENCOUNTER — Ambulatory Visit (INDEPENDENT_AMBULATORY_CARE_PROVIDER_SITE_OTHER): Payer: 59 | Admitting: Family Medicine

## 2022-12-19 ENCOUNTER — Encounter: Payer: Self-pay | Admitting: Family Medicine

## 2022-12-19 VITALS — BP 116/82 | HR 88 | Temp 98.1°F | Ht 61.5 in | Wt 122.1 lb

## 2022-12-19 DIAGNOSIS — I998 Other disorder of circulatory system: Secondary | ICD-10-CM

## 2022-12-19 DIAGNOSIS — I773 Arterial fibromuscular dysplasia: Secondary | ICD-10-CM | POA: Diagnosis not present

## 2022-12-19 DIAGNOSIS — I7779 Dissection of other artery: Secondary | ICD-10-CM | POA: Diagnosis not present

## 2022-12-19 DIAGNOSIS — I722 Aneurysm of renal artery: Secondary | ICD-10-CM | POA: Diagnosis not present

## 2022-12-19 DIAGNOSIS — I728 Aneurysm of other specified arteries: Secondary | ICD-10-CM

## 2022-12-19 LAB — COMPREHENSIVE METABOLIC PANEL
ALT: 12 U/L (ref 0–35)
AST: 16 U/L (ref 0–37)
Albumin: 4.3 g/dL (ref 3.5–5.2)
Alkaline Phosphatase: 46 U/L (ref 39–117)
BUN: 18 mg/dL (ref 6–23)
CO2: 29 meq/L (ref 19–32)
Calcium: 9.6 mg/dL (ref 8.4–10.5)
Chloride: 103 meq/L (ref 96–112)
Creatinine, Ser: 0.74 mg/dL (ref 0.40–1.20)
GFR: 93.34 mL/min (ref >60.00)
Glucose, Bld: 84 mg/dL (ref 70–99)
Potassium: 4.5 meq/L (ref 3.5–5.1)
Sodium: 138 meq/L (ref 135–145)
Total Bilirubin: 0.4 mg/dL (ref 0.2–1.2)
Total Protein: 7 g/dL (ref 6.0–8.3)

## 2022-12-19 LAB — TSH: TSH: 1.46 u[IU]/mL (ref 0.35–5.50)

## 2022-12-19 MED ORDER — ALPRAZOLAM 0.25 MG PO TABS: ORAL_TABLET | ORAL | 0 refills | Status: DC

## 2022-12-19 NOTE — Progress Notes (Signed)
Patient ID: Diamantina Melillo, female    DOB: 09-26-1970, 52 y.o.   MRN: [id number]  This visit was conducted in person.  BP 116/82   Pulse 88   Temp 98.1 F (36.7 C)   Ht 5' 1.5" (1.562 m)   Wt 122 lb 2 oz (55.4 kg)   SpO2 97%   BMI 22.70 kg/m    CC:  Chief Complaint  Patient presents with   Medical Management of Chronic Issues    Here for BP chk. Recent readings have been up and down. Pt has pics of BP readings this AM. Also, c/o HA. Concerned due to h/o dissection of mesenteric artery.    Subjective:   HPI: Helene Labo is a 52 y.o. female presenting on 12/19/2022 for Medical Management of Chronic Issues (Here for BP chk. Recent readings have been up and down. Pt has pics of BP readings this AM. Also, c/o HA. Concerned due to h/o dissection of mesenteric artery.)  She has noted intermittent blood pressure fluctuations at home in last month.  Associated with headache.  143/88 HR 89.. not associated with time of day.  No increase salt, no ETOH.  No change in caffeine, only one cup.  Did have prednsione 2 weeks ago.  Son treated for strep   Fahter with HTN Wt Readings from Last 3 Encounters:  12/19/22 122 lb 2 oz (55.4 kg)  12/17/22 127 lb (57.6 kg)  08/20/22 120 lb 2 oz (54.5 kg)   Recent labs nml TSH, CBC  Nml Cr and hepatic panel 09/2022  History of  fibromuscular dysplasia of renal artery,  history of dissection of mesenteric artery.  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 Gayla Doss Renal artery duplex showed no evidence of renal artery stenosis and normal kidney size bilaterally SMA stent stable   Noted left illliac artery dissection at [facility]... per Dr. Selena Batten... had restarted  working out... left leg fatigue and ache noted... recommended no working out. Pain has improved.  No history of hypertension BP Readings from Last 3 Encounters:  12/19/22 116/82  12/17/22 107/86  08/20/22 110/70          Relevant past medical, surgical, family and social history reviewed and updated as indicated. Interim medical history since our last visit reviewed. Allergies and medications reviewed and updated. Outpatient Medications Prior to Visit  Medication Sig Dispense Refill   acetaminophen (TYLENOL) 500 MG tablet Take 1,000 mg by mouth every 6 (six) hours as needed for moderate pain.     albuterol (VENTOLIN HFA) 108 (90 Base) MCG/ACT inhaler Inhale 1-2 puffs into the lungs every 6 (six) hours as needed for wheezing or shortness of breath.     apixaban (ELIQUIS) 5 MG TABS tablet Take 5 mg by mouth 2 (two) times daily.     aspirin 81 MG chewable tablet Chew 81 mg by mouth daily.     azelastine (OPTIVAR) 0.05 % ophthalmic solution Place 1 drop into both eyes 2 (two) times daily as needed (allergies).     cetirizine (ZYRTEC) 10 MG tablet Take 10 mg by mouth daily.     Cholecalciferol (DIALYVITE VITAMIN D 5000) 125 MCG (5000 UT) capsule Take 5,000 Units by mouth daily.     COLLAGEN PO Take 1 Scoop by mouth daily.     cyclobenzaprine (FLEXERIL) 5 MG tablet Take 5 mg by mouth 3 (three) times daily as needed for muscle spasms.  diphenhydrAMINE (BENADRYL) 25 MG tablet Take 25 mg by mouth at bedtime as needed for itching.     fexofenadine (ALLEGRA) 180 MG tablet Take 180 mg by mouth daily as needed for allergies.  5   fluticasone (FLONASE) 50 MCG/ACT nasal spray Place 2 sprays into both nostrils daily as needed for allergies.     Fluticasone Furoate (ARNUITY ELLIPTA) 100 MCG/ACT AEPB Inhale into the lungs.     hydrocortisone cream 1 % Apply 1 Application topically 2 (two) times daily as needed for itching.     ipratropium (ATROVENT) 0.06 % nasal spray Place into both nostrils.     levothyroxine (SYNTHROID) 25 MCG tablet Take 25 mcg by mouth every morning.     metroNIDAZOLE (METROCREAM) 0.75 % cream Apply to affected areas face once to twice daily for rosacea. 45 g 3   mometasone (ELOCON) 0.1 %  cream Apply to affected areas rash on body once to twice daily as needed. Avoid applying to face, groin, and axilla. Use as directed. Long-term use can cause thinning of the skin. 45 g 1   montelukast (SINGULAIR) 10 MG tablet Take 10 mg by mouth at bedtime.     Multiple Vitamin (MULTIVITAMIN) tablet Take 1 tablet by mouth daily.     PREBIOTIC PRODUCT PO Take 1 capsule by mouth daily.     predniSONE (DELTASONE) 20 MG tablet Take by mouth.     Probiotic Product (PROBIOTIC FORMULA PO) Take 1 tablet by mouth daily.     Ubrogepant (UBRELVY) 100 MG TABS Take 1 tablet by mouth daily as needed (For Migraines). Can repeat dose after 72 hours     ALPRAZolam (XANAX) 0.5 MG tablet CAN USE 1 TAB DAILY AS NEEDED FOR ANXIETY ON LONG TRIPS. 20 tablet 0   No facility-administered medications prior to visit.     Per HPI unless specifically indicated in ROS section below Review of Systems  Constitutional:  Negative for fatigue and fever.  HENT:  Negative for congestion.   Eyes:  Negative for pain.  Respiratory:  Negative for cough and shortness of breath.   Cardiovascular:  Negative for chest pain, palpitations and leg swelling.  Gastrointestinal:  Negative for abdominal pain.  Genitourinary:  Negative for dysuria and vaginal bleeding.  Musculoskeletal:  Negative for back pain.  Neurological:  Negative for syncope, light-headedness and headaches.  Psychiatric/Behavioral:  Negative for dysphoric mood.    Objective:  BP 116/82   Pulse 88   Temp 98.1 F (36.7 C)   Ht 5' 1.5" (1.562 m)   Wt 122 lb 2 oz (55.4 kg)   SpO2 97%   BMI 22.70 kg/m   Wt Readings from Last 3 Encounters:  12/19/22 122 lb 2 oz (55.4 kg)  12/17/22 127 lb (57.6 kg)  08/20/22 120 lb 2 oz (54.5 kg)      Physical Exam Constitutional:      General: She is not in acute distress.    Appearance: Normal appearance. She is well-developed. She is not ill-appearing or toxic-appearing.  HENT:     Head: Normocephalic.     Right Ear:  Hearing, tympanic membrane, ear canal and external ear normal. Tympanic membrane is not erythematous, retracted or bulging.     Left Ear: Hearing, tympanic membrane, ear canal and external ear normal. Tympanic membrane is not erythematous, retracted or bulging.     Nose: No mucosal edema or rhinorrhea.     Right Sinus: No maxillary sinus tenderness or frontal sinus tenderness.  Left Sinus: No maxillary sinus tenderness or frontal sinus tenderness.     Mouth/Throat:     Pharynx: Uvula midline.  Eyes:     General: Lids are normal. Lids are everted, no foreign bodies appreciated.     Conjunctiva/sclera: Conjunctivae normal.     Pupils: Pupils are equal, round, and reactive to light.  Neck:     Thyroid: No thyroid mass or thyromegaly.     Vascular: No carotid bruit.     Trachea: Trachea normal.  Cardiovascular:     Rate and Rhythm: Normal rate and regular rhythm.     Pulses: Normal pulses.     Heart sounds: Normal heart sounds, S1 normal and S2 normal. No murmur heard.    No friction rub. No gallop.  Pulmonary:     Effort: Pulmonary effort is normal. No tachypnea or respiratory distress.     Breath sounds: Normal breath sounds. No decreased breath sounds, wheezing, rhonchi or rales.  Abdominal:     General: Bowel sounds are normal.     Palpations: Abdomen is soft.     Tenderness: There is no abdominal tenderness.  Musculoskeletal:     Cervical back: Normal range of motion and neck supple.  Skin:    General: Skin is warm and dry.     Findings: No rash.  Neurological:     Mental Status: She is alert.  Psychiatric:        Mood and Affect: Mood is not anxious or depressed.        Speech: Speech normal.        Behavior: Behavior normal. Behavior is cooperative.        Thought Content: Thought content normal.        Judgment: Judgment normal.       Results for orders placed or performed in visit on 12/19/22  Comprehensive metabolic panel   Collection Time: 12/19/22  1:07 PM   Result Value Ref Range   Sodium 138 135 - 145 mEq/L   Potassium 4.5 3.5 - 5.1 mEq/L   Chloride 103 96 - 112 mEq/L   CO2 29 19 - 32 mEq/L   Glucose, Bld 84 70 - 99 mg/dL   BUN 18 6 - 23 mg/dL   Creatinine, Ser 6.96 0.40 - 1.20 mg/dL   Total Bilirubin 0.4 0.2 - 1.2 mg/dL   Alkaline Phosphatase 46 39 - 117 U/L   AST 16 0 - 37 U/L   ALT 12 0 - 35 U/L   Total Protein 7.0 6.0 - 8.3 g/dL   Albumin 4.3 3.5 - 5.2 g/dL   GFR 29.52 >84.13 mL/min   Calcium 9.6 8.4 - 10.5 mg/dL  TSH   Collection Time: 12/19/22  1:07 PM  Result Value Ref Range   TSH 1.46 0.35 - 5.50 uIU/mL  Estradiol   Collection Time: 12/19/22  1:07 PM  Result Value Ref Range   Estradiol 147 pg/mL    Assessment and Plan  Fluctuating blood pressure Assessment & Plan: Acute, discussed blood pressure fluctuations and associated complications in the setting of patient with fibromuscular dysplasia along with multiple aneurysms and history of dissection of mesenteric artery. I have asked that she consult her vascular doctor and fibromuscular dysplasia specialist for their recommendations on management of her blood pressure as well as blood pressure goals.  We will evaluate for possible cause of recent fluctuations.  She will continue to avoid alcohol caffeine and high salt.  Given some of the blood pressure fluctuations could be  associated with anxiety we will try a trial of alprazolam to relax her if needed for blood pressures above 140/90.  Alternatively we could consider a as needed beta-blocker if felt appropriate per vascular specialist.  Return and ER precautions provided  Orders: -     Comprehensive metabolic panel -     TSH -     Estradiol  Dissection of mesenteric artery (HCC)  Fibromuscular dysplasia (HCC)  Aneurysm of right renal artery (HCC)  Aneurysm of splenic artery (HCC)  Other orders -     ALPRAZolam; 1/2 to 1 tablet  po daily if BP > or = to 140/90  Dispense: 20 tablet; Refill: 0    No  follow-ups on file.   Kerby Nora, MD

## 2022-12-20 LAB — ESTRADIOL: Estradiol: 147 pg/mL

## 2022-12-23 ENCOUNTER — Encounter: Payer: Self-pay | Admitting: Family Medicine

## 2022-12-23 LAB — HEMOCHROMATOSIS DNA-PCR(C282Y,H63D)

## 2022-12-27 ENCOUNTER — Encounter: Payer: Self-pay | Admitting: *Deleted

## 2022-12-31 DIAGNOSIS — I998 Other disorder of circulatory system: Secondary | ICD-10-CM | POA: Insufficient documentation

## 2022-12-31 NOTE — Assessment & Plan Note (Signed)
Acute, discussed blood pressure fluctuations and associated complications in the setting of patient with fibromuscular dysplasia along with multiple aneurysms and history of dissection of mesenteric artery. I have asked that she consult her vascular doctor and fibromuscular dysplasia specialist for their recommendations on management of her blood pressure as well as blood pressure goals.  We will evaluate for possible cause of recent fluctuations.  She will continue to avoid alcohol caffeine and high salt.  Given some of the blood pressure fluctuations could be associated with anxiety we will try a trial of alprazolam to relax her if needed for blood pressures above 140/90.  Alternatively we could consider a as needed beta-blocker if felt appropriate per vascular specialist.  Return and ER precautions provided

## 2023-01-08 ENCOUNTER — Encounter: Payer: Self-pay | Admitting: Oncology

## 2023-01-15 ENCOUNTER — Encounter: Payer: Self-pay | Admitting: Oncology

## 2023-01-15 ENCOUNTER — Ambulatory Visit: Payer: No Typology Code available for payment source | Admitting: Psychology

## 2023-01-15 DIAGNOSIS — F4310 Post-traumatic stress disorder, unspecified: Secondary | ICD-10-CM | POA: Diagnosis not present

## 2023-01-15 DIAGNOSIS — F411 Generalized anxiety disorder: Secondary | ICD-10-CM | POA: Diagnosis not present

## 2023-01-15 NOTE — Progress Notes (Addendum)
 [facility] Counselor/Therapist Progress Note  Patient ID: Isabel Foley, MRN: [id number],    Date: 01/15/2023  Time Spent: 11:02am-12:07pm   Pt is seen for a virtual video visit.  Pt joins from her home reporting privacy, and counselor from her home office.  Pt consents to virtual visit and is aware of limitations of such visits.    Treatment Type: Individual Therapy  Reported Symptoms: continued anxiety w/ driving, ruminating worries, easily triggered with conflict/tension.  Mental Status Exam: Appearance:  Well Groomed     Behavior: Appropriate  Motor: Normal  Speech/Language:  Clear and Coherent and Normal Rate  Affect: Appropriate  Mood: anxious  Thought process: normal  Thought content:   WNL  Sensory/Perceptual disturbances:   WNL  Orientation: oriented to person, place, time/date, and situation  Attention: Good  Concentration: Good  Memory: WNL  Fund of knowledge:  Good  Insight:   Good  Judgment:  Good  Impulse Control: Good   Risk Assessment: Danger to Self:  No Self-injurious Behavior: No Danger to Others: No Duty to Warn:no Physical Aggression / Violence:No  Access to Firearms a concern: No  Gang Involvement:No   Subjective: Counselor assessed pt current functioning per pt report.  Processed w/ pt positives and stressors.  Explored pt inventory of relaxation/grounding for self.  Discussed stressors w/ family tension and transitions w/ kids leaving home.  Introduced pt to standard pacific for grounding and led pt through heart focus/heart breathing techniques.  Pt affect wnl.  Pt reported that her recent doctor appointment for f/u on [facility] was good.  Pt reports anxiety remains high driving on highways.  Pt reports she is not currently aware of any relaxation practices that she is using or helping her.  Pt reports when gets anxious it quickly escalatesto panic.  Pt discussed stressors w/ son and husband's tension and not talking.  Pt also reports some  worry w/ daughter planning for [employer].  Pt receptive to use of heartmath techniques and agrees to practice on own heartfocus and heart breathing.    Interventions: Cognitive Behavioral Therapy, Mindfulness Meditation, and Psycho-education/Bibliotherapy  Diagnosis:GAD (generalized anxiety disorder)  PTSD (post-traumatic stress disorder)  Plan: Pt to f/u in 2 weeks w/ counseling.  Pt to f/u as scheduled w/ PCP and specialists.   Individualized Treatment Plan Strengths: enjoys travel and time w/ family  Supports: husband, friends/church community   Goal/Needs for Treatment:  In order of importance to patient 1) cope w/ anxiety 2) -- 3) ---   Client Statement of Needs: Coping w/ the anxiety   Treatment Level:outpatient counseling  Symptoms:anxiety/on edge, difficulty w/ worry, mind constantly going.  If slows down fatigue.    Client Treatment Preferences:biweekly counseling.  Continue  w/ PCP and specialists.   Healthcare consumer's goal for treatment:  Counselor, Damien Herald, [facility] will support the patient's ability to achieve the goals identified. Cognitive Behavioral Therapy, Assertive Communication/Conflict Resolution Training, Relaxation Training, ACT, Humanistic and other evidenced-based practices will be used to promote progress towards healthy functioning.   Healthcare consumer will: Actively participate in therapy, working towards healthy functioning.    *Justification for Continuation/Discontinuation of Goal: R=Revised, O=Ongoing, A=Achieved, D=Discontinued  Goal 1) Increase use of relaxation/mindfulness and descalation skills to reduce and cop w/ symptoms of anxiety.   Baseline date 11/25/22: Progress towards goal 0; How Often - Daily Target Date Goal Was reviewed Status Code Progress towards goal/Likert rating  11/25/23  Goal 2) Identify, challenge, and replace biased, fearful self-talk with positive, realistic, and empowering self-talk. Baseline  date 11/25/22: Progress towards goal 0; How Often - Daily Target Date Goal Was reviewed Status Code Progress towards goal  11/25/23                This plan has been reviewed and created by the following participants:  This plan will be reviewed at least every 12 months. Date Behavioral Health Clinician Date Guardian/Patient   11/25/22  [facility] [name] [facility] 11/25/22 Verbal Consent Provided                               [name] APPL [facility]

## 2023-01-21 ENCOUNTER — Other Ambulatory Visit: Payer: Self-pay | Admitting: Family Medicine

## 2023-01-28 ENCOUNTER — Ambulatory Visit: Payer: No Typology Code available for payment source | Admitting: Psychology

## 2023-01-28 DIAGNOSIS — F4310 Post-traumatic stress disorder, unspecified: Secondary | ICD-10-CM

## 2023-01-28 DIAGNOSIS — F411 Generalized anxiety disorder: Secondary | ICD-10-CM | POA: Diagnosis not present

## 2023-01-28 NOTE — Progress Notes (Signed)
[facility] Counselor/Therapist Progress Note  Patient ID: Isabel Foley, MRN: [id number],    Date: 01/28/2023  Time Spent: 11:00am-11:55am   Pt is seen for a virtual video visit.  Pt joins from her home reporting privacy, and counselor from her home office.  Pt consents to virtual visit and is aware of limitations of such visits.    Treatment Type: Individual Therapy  Reported Symptoms: continued anxiety w/ driving, ruminating worries, difficulty w/ change.  Mental Status Exam: Appearance:  Well Groomed     Behavior: Appropriate  Motor: Normal  Speech/Language:  Clear and Coherent and Normal Rate  Affect: Appropriate  Mood: anxious  Thought process: normal  Thought content:   WNL  Sensory/Perceptual disturbances:   WNL  Orientation: oriented to person, place, time/date, and situation  Attention: Good  Concentration: Good  Memory: WNL  Fund of knowledge:  Good  Insight:   Good  Judgment:  Good  Impulse Control: Good   Risk Assessment: Danger to Self:  No Self-injurious Behavior: No Danger to Others: No Duty to Warn:no Physical Aggression / Violence:No  Access to Firearms a concern: No  Gang Involvement:No   Subjective: Counselor assessed pt current functioning per pt report.  Processed w/ pt positives and stressors.  Explored pt use of heartmath quick coherence technique, encouraged continued practice and discussed ways to pair w/ current routine activities to increase practice consistency.  Discussed use of 5 senses for mindful moment pauses. Reflected positives w/ resolved family stressor and positive health report from recent specialist visits.  Pt affect wnl.  Pt reported that her recent doctor appointment w/ [facility] cardiologist had good news- heart functioning good and dissection healed.   Pt reports anxiety remains high driving on highways.  Pt reports is aware of anxiety w/ change in routines.  Pt discussed struggles this and not being in control.  Pt  able to reframe distortions that has to be in control.  Pt reports that she has practiced technique some but not consistently.  Pt able to identify ways to work into routine more consistently.    Interventions: Cognitive Behavioral Therapy, Mindfulness Meditation, and Psycho-education/Bibliotherapy  Diagnosis:GAD (generalized anxiety disorder)  PTSD (post-traumatic stress disorder)  Plan: Pt to f/u in 2 weeks w/ counseling.  Pt to f/u as scheduled w/ PCP and specialists.   Individualized Treatment Plan Strengths: enjoys travel and time w/ family  Supports: husband, friends/church community   Goal/Needs for Treatment:  In order of importance to patient 1) cope w/ anxiety 2) -- 3) ---   Client Statement of Needs: "Coping w/ the anxiety"   Treatment Level:outpatient counseling  Symptoms:anxiety/on edge, difficulty w/ worry, mind constantly going.  If slows down fatigue.    Client Treatment Preferences:biweekly counseling.  Continue  w/ PCP and specialists.   Healthcare consumer's goal for treatment:  Counselor, Isabel Foley, [name] will support the patient's ability to achieve the goals identified. Cognitive Behavioral Therapy, Assertive Communication/Conflict Resolution Training, Relaxation Training, ACT, Humanistic and other evidenced-based practices will be used to promote progress towards healthy functioning.   Healthcare consumer will: Actively participate in therapy, working towards healthy functioning.    *Justification for Continuation/Discontinuation of Goal: R=Revised, O=Ongoing, A=Achieved, D=Discontinued  Goal 1) Increase use of relaxation/mindfulness and descalation skills to reduce and cop w/ symptoms of anxiety.   Baseline date 11/25/22: Progress towards goal 0; How Often - Daily Target Date Goal Was reviewed Status Code Progress towards goal/Likert rating  11/25/23  Goal 2) Identify, challenge, and replace biased, fearful self-talk with positive,  realistic, and empowering self-talk. Baseline date 11/25/22: Progress towards goal 0; How Often - Daily Target Date Goal Was reviewed Status Code Progress towards goal  11/25/23                This plan has been reviewed and created by the following participants:  This plan will be reviewed at least every 12 months. Date Behavioral Health Clinician Date Guardian/Patient   11/25/22  [name] [name] [name] 11/25/22 Verbal Consent Provided                              Isabel Foley [name]

## 2023-02-02 ENCOUNTER — Other Ambulatory Visit: Payer: Self-pay | Admitting: Dermatology

## 2023-02-04 ENCOUNTER — Other Ambulatory Visit: Payer: Self-pay | Admitting: Family Medicine

## 2023-02-04 DIAGNOSIS — Z1231 Encounter for screening mammogram for malignant neoplasm of breast: Secondary | ICD-10-CM

## 2023-02-10 ENCOUNTER — Ambulatory Visit (INDEPENDENT_AMBULATORY_CARE_PROVIDER_SITE_OTHER): Payer: No Typology Code available for payment source | Admitting: Psychology

## 2023-02-10 DIAGNOSIS — F411 Generalized anxiety disorder: Secondary | ICD-10-CM

## 2023-02-10 DIAGNOSIS — F4310 Post-traumatic stress disorder, unspecified: Secondary | ICD-10-CM
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
## 2023-02-11 ENCOUNTER — Ambulatory Visit
Admission: RE | Admit: 2023-02-11 | Discharge: 2023-02-11 | Disposition: A | Discharge status: Home / Self Care | Payer: No Typology Code available for payment source | Source: Ambulatory Visit | Attending: Family Medicine | Admitting: Family Medicine

## 2023-02-11 DIAGNOSIS — Z1231 Encounter for screening mammogram for malignant neoplasm of breast: Secondary | ICD-10-CM | POA: Diagnosis present

## 2023-02-21 ENCOUNTER — Other Ambulatory Visit: Payer: Self-pay | Admitting: Obstetrics and Gynecology

## 2023-02-21 DIAGNOSIS — N6310 Unspecified lump in the right breast, unspecified quadrant: Secondary | ICD-10-CM

## 2023-02-28 ENCOUNTER — Other Ambulatory Visit: Payer: Self-pay | Admitting: Family Medicine

## 2023-03-03 ENCOUNTER — Ambulatory Visit (INDEPENDENT_AMBULATORY_CARE_PROVIDER_SITE_OTHER): Payer: No Typology Code available for payment source | Admitting: Psychology

## 2023-03-03 DIAGNOSIS — F4310 Post-traumatic stress disorder, unspecified: Secondary | ICD-10-CM

## 2023-03-03 DIAGNOSIS — F411 Generalized anxiety disorder: Secondary | ICD-10-CM | POA: Diagnosis not present

## 2023-03-03 NOTE — Progress Notes (Signed)
 [facility] Counselor/Therapist Progress Note  Patient ID: Isabel Foley, MRN: [id number],    Date: 03/03/2023  Time Spent: 11:01am-12:05pm   Pt is seen for a virtual video visit.  Pt joins from her home reporting privacy, and counselor from her home office.  Pt consents to virtual visit and is aware of limitations of such visits.    Treatment Type: Individual Therapy  Reported Symptoms: pt reports stressors w/ worry for children and her health.  Pt grounding in present interactions and engagement.  Mental Status Exam: Appearance:  Well Groomed     Behavior: Appropriate  Motor: Normal  Speech/Language:  Clear and Coherent and Normal Rate  Affect: Appropriate  Mood: anxious  Thought process: normal  Thought content:   WNL  Sensory/Perceptual disturbances:   WNL  Orientation: oriented to person, place, time/date, and situation  Attention: Good  Concentration: Good  Memory: WNL  Fund of knowledge:  Good  Insight:   Good  Judgment:  Good  Impulse Control: Good   Risk Assessment: Danger to Self:  No Self-injurious Behavior: No Danger to Others: No Duty to Warn:no Physical Aggression / Violence:No  Access to Firearms a concern: No  Gang Involvement:No   Subjective: Counselor assessed pt current functioning per pt report.  Processed w/ pt recent stressors and anxiety.  Validated and normalized stressors of changes and health concerns.  Assisted pt w/ acknowledging distortions, focus on engaging in present moment interactions and encouraging self statements.  Pt affect wnl.  Pt reported things have been busy recent: her daughter taking further steps to enlisting, her other daughter started working, her oldest son announced expecting 4th child, pt mammogram found spot that needs further diagnostic mammogram and ultrasound.  Pt discussed worry for adult children and hardships facing or instability present.  Pt discussed focusing on what is in her control- redirecting  when ruminating and engaging in present moment interactions and activities.  Pt is finding that helpful to her.     Interventions: Cognitive Behavioral Therapy, Mindfulness Meditation, and Psycho-education/Bibliotherapy  Diagnosis:GAD (generalized anxiety disorder)  PTSD (post-traumatic stress disorder)  Plan: Pt to f/u in 2 weeks w/ counseling. Pt referred for ADHD testing and given options for pursuing. Pt to f/u as scheduled w/ PCP and specialists.   Individualized Treatment Plan Strengths: enjoys travel and time w/ family  Supports: husband, friends/church community   Goal/Needs for Treatment:  In order of importance to patient 1) cope w/ anxiety 2) -- 3) ---   Client Statement of Needs: "Coping w/ the anxiety"   Treatment Level:outpatient counseling  Symptoms:anxiety/on edge, difficulty w/ worry, mind constantly going.  If slows down fatigue.    Client Treatment Preferences:biweekly counseling.  Continue  w/ PCP and specialists.   Healthcare consumer's goal for treatment:  Counselor, [name], [facility] will support the patient's ability to achieve the goals identified. Cognitive Behavioral Therapy, Assertive Communication/Conflict Resolution Training, Relaxation Training, ACT, Humanistic and other evidenced-based practices will be used to promote progress towards healthy functioning.   Healthcare consumer will: Actively participate in therapy, working towards healthy functioning.    *Justification for Continuation/Discontinuation of Goal: R=Revised, O=Ongoing, A=Achieved, D=Discontinued  Goal 1) Increase use of relaxation/mindfulness and descalation skills to reduce and cop w/ symptoms of anxiety.   Baseline date 11/25/22: Progress towards goal 0; How Often - Daily Target Date Goal Was reviewed Status Code Progress towards goal/Likert rating  11/25/23  Goal 2) Identify, challenge, and replace biased, fearful self-talk with positive, realistic, and  empowering self-talk. Baseline date 11/25/22: Progress towards goal 0; How Often - Daily Target Date Goal Was reviewed Status Code Progress towards goal  11/25/23                This plan has been reviewed and created by the following participants:  This plan will be reviewed at least every 12 months. Date Behavioral Health Clinician Date Guardian/Patient   11/25/22  [facility] [name] [facility] 11/25/22 Verbal Consent Provided                          [name] [facility]

## 2023-03-04 ENCOUNTER — Other Ambulatory Visit: Payer: Self-pay | Admitting: Family Medicine

## 2023-03-04 ENCOUNTER — Encounter: Payer: Self-pay | Admitting: Family Medicine

## 2023-03-04 MED ORDER — ALPRAZOLAM 0.5 MG PO TABS: ORAL_TABLET | ORAL | 0 refills | Status: DC

## 2023-03-04 NOTE — Telephone Encounter (Signed)
 Last office visit 12/19/2022 for fluctuating BP.  Last refilled 12/19/2022 for #20 with no refills.  Next Appt: No future appointments with PCP.

## 2023-03-17 ENCOUNTER — Ambulatory Visit: Payer: No Typology Code available for payment source | Admitting: Psychology

## 2023-03-18 ENCOUNTER — Ambulatory Visit
Admission: RE | Admit: 2023-03-18 | Discharge: 2023-03-18 | Disposition: A | Discharge status: Home / Self Care | Payer: No Typology Code available for payment source | Source: Ambulatory Visit | Attending: Obstetrics and Gynecology | Admitting: Obstetrics and Gynecology

## 2023-03-18 DIAGNOSIS — N6310 Unspecified lump in the right breast, unspecified quadrant: Secondary | ICD-10-CM

## 2023-03-21 ENCOUNTER — Encounter: Payer: Self-pay | Admitting: Family

## 2023-03-21 ENCOUNTER — Ambulatory Visit (INDEPENDENT_AMBULATORY_CARE_PROVIDER_SITE_OTHER): Admitting: Family

## 2023-03-21 VITALS — BP 110/78 | HR 71 | Temp 98.5°F | Ht 61.5 in | Wt 121.0 lb

## 2023-03-21 DIAGNOSIS — J069 Acute upper respiratory infection, unspecified: Secondary | ICD-10-CM

## 2023-03-21 MED ORDER — PREDNISONE 20 MG PO TABS: 20.0000 mg | ORAL_TABLET | Freq: Every day | ORAL | 0 refills | Status: DC

## 2023-03-21 MED ORDER — AMOXICILLIN-POT CLAVULANATE 875-125 MG PO TABS: 1.0000 | ORAL_TABLET | Freq: Two times a day (BID) | ORAL | 0 refills | Status: DC

## 2023-03-21 NOTE — Progress Notes (Signed)
 Acute Office Visit  Subjective:     Patient ID: Isabel Foley, female    DOB: 13-Oct-1970, 53 y.o.   MRN: [id number]  Chief Complaint  Patient presents with  . Respiratory Issues    Started about 1 month ago with cough, sneezing, runny nose, low grade fever, sore throat, hoarseness. Was getting better. Woke up this morning with a headache and fever. Was diagnosed with COPD last year.     HPI Patient is in today with complaints of cough, congestion that tends to be worse at night ongoing since February 10.  Reports having a low-grade fever.  Denies any body aches.  Has been taking Coricidin HBP for her symptoms.  She also was prescribed Ventolin and Atrovent.  She uses the Ventolin only when necessary.  She has a complex medical history.  Review of Systems  Constitutional:  Positive for fever.  HENT:  Positive for congestion.   Respiratory:  Positive for cough. Negative for shortness of breath and wheezing.   Cardiovascular: Negative.  Negative for chest pain.  Gastrointestinal: Negative.   Neurological: Negative.   Psychiatric/Behavioral: Negative.    All other systems reviewed and are negative. Past Medical History:  Diagnosis Date  . Abdominal pain, right upper quadrant   . Allergic rhinitis, cause unspecified   . Allergy-induced asthma   . Anemia   . Aneurysm of right renal artery (HCC)   . Anxiety   . Arthritis   . Depressive disorder, not elsewhere classified   . Dizziness and giddiness   . Esophageal reflux   . Fibromuscular dysplasia (HCC)   . Gallstones   . Headache   . Liver lesion   . Nontoxic multinodular goiter   . Other chronic sinusitis   . Other malaise and fatigue   . Pain in limb   . Peptic ulcer, unspecified site, unspecified as acute or chronic, without mention of hemorrhage, perforation, or obstruction   . Premenstrual tension syndromes   . Pure hypercholesterolemia   . TIA (transient ischemic attack)   . Unspecified hypothyroidism     Social  History   Socioeconomic History  . Marital status: Married    Spouse name: Not on file  . Number of children: 7  . Years of education: Not on file  . Highest education level: Not on file  Occupational History  . Occupation: Stay at home mom  Tobacco Use  . Smoking status: Never  . Smokeless tobacco: Never  Vaping Use  . Vaping status: Never Used  Substance and Sexual Activity  . Alcohol use: No  . Drug use: No  . Sexual activity: Yes    Birth control/protection: Surgical    Comment: husband with vasectomy  Other Topics Concern  . Not on file  Social History Narrative   7 children; 6 biologic.Marland KitchenMarland KitchenADHD      Married; previously divorced      Stay at home mom      Regular exercise-yes, daily aerobics, treadmill      Diet: Skips meals, limited veggies; ice cream in AM   Social Drivers of Health   Financial Resource Strain: Patient Declined (10/06/2022)   Received from [facility]   Overall Financial Resource Strain (CARDIA)   . Difficulty of Paying Living Expenses: Patient declined  Food Insecurity: Patient Declined (10/06/2022)   Received from [facility]   Hunger Vital Sign   . Worried About Programme researcher, broadcasting/film/video in the Last Year: Patient declined   .  Ran Out of Food in the Last Year: Patient declined  Transportation Needs: Patient Declined (10/06/2022)   Received from [facility]   [facility] - Transportation   . In the past 12 months, has lack of transportation kept you from medical appointments or from getting medications?: Patient declined   . Lack of Transportation (Non-Medical): Patient declined  Physical Activity: Not on file  Stress: Not on file  Social Connections: Not on file  Intimate Partner Violence: Not on file    Past Surgical History:  Procedure Laterality Date  . 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 BREAST BIOPSY Right 06/01/2015   stereo, path pending  . COLONOSCOPY  WITH PROPOFOL N/A 06/21/2022   Procedure: COLONOSCOPY WITH PROPOFOL;  Surgeon: Toney Reil, MD;  Location: [facility] ENDOSCOPY;  Service: Gastroenterology;  Laterality: N/A;  . DILATION AND CURETTAGE OF UTERUS  01/08/2000  . DILITATION & CURRETTAGE/HYSTROSCOPY WITH NOVASURE ABLATION N/A 12/06/2021   Procedure: DILATATION & CURETTAGE/HYSTEROSCOPY WITH NOVASURE ABLATION;  Surgeon: Huel Cote, MD;  Location: [facility] OR;  Service: Gynecology;  Laterality: N/A;  . ESOPHAGOGASTRODUODENOSCOPY (EGD) WITH PROPOFOL N/A 06/21/2022   Procedure: ESOPHAGOGASTRODUODENOSCOPY (EGD) WITH PROPOFOL;  Surgeon: Toney Reil, MD;  Location: [facility] ENDOSCOPY;  Service: Gastroenterology;  Laterality: N/A;  . LEEP  01/07/1998    Family History  Problem Relation Age of Onset  . Anxiety disorder Mother   . Hyperthyroidism Mother   . Irritable bowel syndrome Mother   . Anxiety disorder Father   . Colon polyps Father   . Hypertension Father   . Hypothyroidism Father   . Anxiety disorder Sister   . Other Sister        Collagen Vascular Disease  . Anxiety disorder Brother   . Heart disease Brother   . Atrial fibrillation Brother 20  . Breast cancer Cousin   . ADD / ADHD Son   . Anxiety disorder Son   . ADD / ADHD Son   . Anxiety disorder Daughter   . ADD / ADHD Daughter     Allergies  Allergen Reactions  . Molds & Smuts Shortness Of Breath and Other (See Comments)    wheezing  . Ciprofloxacin Other (See Comments)    Cx due to dissection can increase risk   . Levaquin [Levofloxacin] Other (See Comments)    Cx due to dissection can increase risk     Current Outpatient Medications on File Prior to Visit  Medication Sig Dispense Refill  . acetaminophen (TYLENOL) 500 MG tablet Take 1,000 mg by mouth every 6 (six) hours as needed for moderate pain.    Marland Kitchen albuterol (VENTOLIN HFA) 108 (90 Base) MCG/ACT inhaler Inhale 1-2 puffs into the lungs every 6 (six) hours as needed for wheezing or shortness of  breath.    . ALPRAZolam (XANAX) 0.5 MG tablet 1 tablet daily as needed for travel associated anxiety prior to plane flight 10 tablet 0  . apixaban (ELIQUIS) 5 MG TABS tablet Take 5 mg by mouth 2 (two) times daily.    Marland Kitchen aspirin 81 MG chewable tablet Chew 81 mg by mouth daily.    Marland Kitchen azelastine (OPTIVAR) 0.05 % ophthalmic solution Place 1 drop into both eyes 2 (two) times daily as needed (allergies).    . cetirizine (ZYRTEC) 10 MG tablet Take 10 mg by mouth daily.    . Cholecalciferol (DIALYVITE VITAMIN D 5000) 125 MCG (5000 UT) capsule Take 5,000 Units by mouth daily.    Marland Kitchen  COLLAGEN PO Take 1 Scoop by mouth daily.    . cyclobenzaprine (FLEXERIL) 5 MG tablet Take 5 mg by mouth 3 (three) times daily as needed for muscle spasms.    . diphenhydrAMINE (BENADRYL) 25 MG tablet Take 25 mg by mouth at bedtime as needed for itching.    . fexofenadine (ALLEGRA) 180 MG tablet Take 180 mg by mouth daily as needed for allergies.  5  . fluticasone (FLONASE) 50 MCG/ACT nasal spray Place 2 sprays into both nostrils daily as needed for allergies.    . Fluticasone Furoate (ARNUITY ELLIPTA) 100 MCG/ACT AEPB Inhale into the lungs.    . hydrocortisone cream 1 % Apply 1 Application topically 2 (two) times daily as needed for itching.    Marland Kitchen ipratropium (ATROVENT) 0.06 % nasal spray Place into both nostrils.    Marland Kitchen levothyroxine (SYNTHROID) 25 MCG tablet Take 25 mcg by mouth every morning.    . metroNIDAZOLE (METROCREAM) 0.75 % cream Apply to affected areas face once to twice daily for rosacea. 45 g 3  . mometasone (ELOCON) 0.1 % cream APPLY TO AFFECTED AREAS RASH ON BODY ONCE TO TWICE DAILY AS NEEDED. AVOID APPLYING TO FACE, GROIN, AND AXILLA. USE AS DIRECTED. LONG-TERM USE CAN CAUSE THINNING OF THE SKIN. 45 g 1  . montelukast (SINGULAIR) 10 MG tablet Take 10 mg by mouth at bedtime.    . Multiple Vitamin (MULTIVITAMIN) tablet Take 1 tablet by mouth daily.    Marland Kitchen PREBIOTIC PRODUCT PO Take 1 capsule by mouth daily.    . Probiotic  Product (PROBIOTIC FORMULA PO) Take 1 tablet by mouth daily.    Marland Kitchen Ubrogepant (UBRELVY) 100 MG TABS Take 1 tablet by mouth daily as needed (For Migraines). Can repeat dose after 72 hours    . predniSONE (DELTASONE) 20 MG tablet Take by mouth. (Patient not taking: Reported on 03/21/2023)     No current facility-administered medications on file prior to visit.    BP 110/78 (BP Location: Left Arm, Patient Position: Sitting, Cuff Size: Normal)   Pulse 71   Temp 98.5 F (36.9 C) (Oral)   Ht 5' 1.5" (1.562 m)   Wt 121 lb (54.9 kg)   SpO2 97%   BMI 22.49 kg/m chart      Objective:    BP 110/78 (BP Location: Left Arm, Patient Position: Sitting, Cuff Size: Normal)   Pulse 71   Temp 98.5 F (36.9 C) (Oral)   Ht 5' 1.5" (1.562 m)   Wt 121 lb (54.9 kg)   SpO2 97%   BMI 22.49 kg/m    Physical Exam Vitals and nursing note reviewed.  Constitutional:      Appearance: Normal appearance.  HENT:     Right Ear: Ear canal normal.     Left Ear: Ear canal normal.     Ears:     Comments: Small amount of fluid in the ears bilaterally    Nose: Congestion present.     Mouth/Throat:     Mouth: Mucous membranes are moist.  Cardiovascular:     Rate and Rhythm: Normal rate and regular rhythm.  Pulmonary:     Effort: Pulmonary effort is normal.     Breath sounds: Normal breath sounds. No wheezing.  Musculoskeletal:     Cervical back: Normal range of motion and neck supple.  Skin:    General: Skin is warm and dry.  Neurological:     General: No focal deficit present.     Mental Status: She is alert  and oriented to person, place, and time. Mental status is at baseline.  Psychiatric:        Mood and Affect: Mood normal.        Behavior: Behavior normal.   No results found for any visits on 03/21/23.      Assessment & Plan:   Problem List Items Addressed This Visit   None Visit Diagnoses       Upper respiratory tract infection, unspecified type    -  Primary       Meds ordered  this encounter  Medications  . amoxicillin-clavulanate (AUGMENTIN) 875-125 MG tablet    Sig: Take 1 tablet by mouth 2 (two) times daily.    Dispense:  20 tablet    Refill:  0  . predniSONE (DELTASONE) 20 MG tablet    Sig: Take 1 tablet (20 mg total) by mouth daily with breakfast.    Dispense:  7 tablet    Refill:  0   Call the office if symptoms worsen or persist.  Recheck as scheduled and sooner as needed. No follow-ups on file.  Eulis Foster, FNP

## 2023-03-24 ENCOUNTER — Ambulatory Visit: Payer: No Typology Code available for payment source | Admitting: Psychology

## 2023-03-24 DIAGNOSIS — F4310 Post-traumatic stress disorder, unspecified: Secondary | ICD-10-CM | POA: Diagnosis not present

## 2023-03-24 DIAGNOSIS — F411 Generalized anxiety disorder: Secondary | ICD-10-CM

## 2023-03-24 NOTE — Progress Notes (Addendum)
 [facility] Counselor/Therapist Progress Note  Patient ID: Isabel Foley, MRN: [id number],    Date: 03/24/2023  Time Spent: 10:03am-11:00am   Pt is seen for a virtual video visit.  Pt joins from her home reporting privacy, and counselor from her home office.  Pt consents to virtual visit and is aware of limitations of such visits.    Treatment Type: Individual Therapy  Reported Symptoms: pt reports a lot of recent stressors in family.  Pt reports anxiety and worry, but also acknowledging how processing and coping.    Mental Status Exam: Appearance:  Well Groomed     Behavior: Appropriate  Motor: Normal  Speech/Language:  Clear and Coherent and Normal Rate  Affect: Appropriate  Mood: anxious  Thought process: normal  Thought content:   WNL  Sensory/Perceptual disturbances:   WNL  Orientation: oriented to person, place, time/date, and situation  Attention: Good  Concentration: Good  Memory: WNL  Fund of knowledge:  Good  Insight:   Good  Judgment:  Good  Impulse Control: Good   Risk Assessment: Danger to Self:  No Self-injurious Behavior: No Danger to Others: No Duty to Warn:no Physical Aggression / Violence:No  Access to Firearms a concern: No  Gang Involvement:No   Subjective: Counselor assessed pt current functioning per pt report.  Processed w/ pt recent stressors and how coping. Validated anxiety and impact of stressors.  Assisted pt w/ recognizing how stressors resolved and how she has been working through her anxiety.      Pt affect wnl.  Pt reported that in past couple weeks a lot of unexpected stressors- daughter's breakup, grandson's injury, her tooth emergency, needing to replace washer, son's girlfriend ED visit and husband's ED visit.  Pt acknowledged impact of these stressors and anxiety.  Pt reflected on how she doesn't do well w/ change and unexpected.  Pt also recognized how she was able to be support to others and not staying in  catastrophizing thinking.  Pt discussed use of her coping skills to process through and how able to get through things and not avoid or shut down.  Pt reports on things upcoming that looking forward to.    Interventions: Cognitive Behavioral Therapy, Mindfulness Meditation, and Psycho-education/Bibliotherapy  Diagnosis:GAD (generalized anxiety disorder)  PTSD (post-traumatic stress disorder)  Plan: Pt to f/u in 2 weeks w/ counseling. Pt referred for ADHD testing and given options for pursuing. Pt to f/u as scheduled w/ PCP and specialists.   Individualized Treatment Plan Strengths: enjoys travel and time w/ family  Supports: husband, friends/church community   Goal/Needs for Treatment:  In order of importance to patient 1) cope w/ anxiety 2) -- 3) ---   Client Statement of Needs: "Coping w/ the anxiety"   Treatment Level:outpatient counseling  Symptoms:anxiety/on edge, difficulty w/ worry, mind constantly going.  If slows down fatigue.    Client Treatment Preferences:biweekly counseling.  Continue  w/ PCP and specialists.   Healthcare consumer's goal for treatment:  Counselor, [name], [facility] will support the patient's ability to achieve the goals identified. Cognitive Behavioral Therapy, Assertive Communication/Conflict Resolution Training, Relaxation Training, ACT, Humanistic and other evidenced-based practices will be used to promote progress towards healthy functioning.   Healthcare consumer will: Actively participate in therapy, working towards healthy functioning.    *Justification for Continuation/Discontinuation of Goal: R=Revised, O=Ongoing, A=Achieved, D=Discontinued  Goal 1) Increase use of relaxation/mindfulness and descalation skills to reduce and cop w/ symptoms of anxiety.   Baseline date 11/25/22:  Progress towards goal 0; How Often - Daily Target Date Goal Was reviewed Status Code Progress towards goal/Likert rating  11/25/23                Goal 2)  Identify, challenge, and replace biased, fearful self-talk with positive, realistic, and empowering self-talk. Baseline date 11/25/22: Progress towards goal 0; How Often - Daily Target Date Goal Was reviewed Status Code Progress towards goal  11/25/23                This plan has been reviewed and created by the following participants:  This plan will be reviewed at least every 12 months. Date Behavioral Health Clinician Date Guardian/Patient   11/25/22  [name] [name] [name] 11/25/22 Verbal Consent Provided                           [name] [name]

## 2023-03-31 ENCOUNTER — Ambulatory Visit: Payer: No Typology Code available for payment source | Admitting: Psychology
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
## 2023-04-04 ENCOUNTER — Ambulatory Visit: Payer: No Typology Code available for payment source | Admitting: Psychology

## 2023-04-04 DIAGNOSIS — F89 Unspecified disorder of psychological development: Secondary | ICD-10-CM

## 2023-04-09 ENCOUNTER — Ambulatory Visit (INDEPENDENT_AMBULATORY_CARE_PROVIDER_SITE_OTHER): Payer: No Typology Code available for payment source | Admitting: Psychology

## 2023-04-09 DIAGNOSIS — F411 Generalized anxiety disorder: Secondary | ICD-10-CM | POA: Diagnosis not present

## 2023-04-09 DIAGNOSIS — F4310 Post-traumatic stress disorder, unspecified: Secondary | ICD-10-CM

## 2023-04-09 NOTE — Progress Notes (Signed)
 [facility] Counselor/Therapist Progress Note  Patient ID: Isabel Foley, MRN: [id number],    Date: 04/09/2023  Time Spent: 11:00am-12:00pm   Pt is seen for a virtual video visit.  Pt joins from her home reporting privacy, and counselor from her home office.  Pt consents to virtual visit and is aware of limitations of such visits.    Treatment Type: Individual Therapy  Reported Symptoms: pt reports anxiety and worry w/ husband's health.   Mental Status Exam: Appearance:  Well Groomed     Behavior: Appropriate  Motor: Normal  Speech/Language:  Clear and Coherent and Normal Rate  Affect: Appropriate  Mood: anxious  Thought process: normal  Thought content:   WNL  Sensory/Perceptual disturbances:   WNL  Orientation: oriented to person, place, time/date, and situation  Attention: Good  Concentration: Good  Memory: WNL  Fund of knowledge:  Good  Insight:   Good  Judgment:  Good  Impulse Control: Good   Risk Assessment: Danger to Self:  No Self-injurious Behavior: No Danger to Others: No Duty to Warn:no Physical Aggression / Violence:No  Access to Firearms a concern: No  Gang Involvement:No   Subjective: Counselor assessed pt current functioning per pt report.  Processed w/ pt recent stressors and anxiety.  Validated and normalized anxiety w/ health stressors.  Assisted pt w/ what if distortions and focus on reframe w/ present awareness/facts.   Pt affect wnl.  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  Pt discussed how feeling like puts in waiting pattern to make decisions about being able to go to family wedding/travel and travel for daughter's graduation.  Pt discussed focus on present and reframing when gets into what ifs.  Pt is looking forward to trip to [facility] w/ husband and 3 kids in 2 weeks.   Interventions: Cognitive Behavioral Therapy and Mindfulness  Meditation  Diagnosis:GAD (generalized anxiety disorder)  PTSD (post-traumatic stress disorder)  Plan: Pt to f/u in 2 weeks w/ counseling. Pt referred for ADHD testing and given options for pursuing. Pt to f/u as scheduled w/ PCP and specialists.   Individualized Treatment Plan Strengths: enjoys travel and time w/ family  Supports: husband, friends/church community   Goal/Needs for Treatment:  In order of importance to patient 1) cope w/ anxiety 2) -- 3) ---   Client Statement of Needs: "Coping w/ the anxiety"   Treatment Level:outpatient counseling  Symptoms:anxiety/on edge, difficulty w/ worry, mind constantly going.  If slows down fatigue.    Client Treatment Preferences:biweekly counseling.  Continue  w/ PCP and specialists.   Healthcare consumer's goal for treatment:  Counselor, [name], [facility] will support the patient's ability to achieve the goals identified. Cognitive Behavioral Therapy, Assertive Communication/Conflict Resolution Training, Relaxation Training, ACT, Humanistic and other evidenced-based practices will be used to promote progress towards healthy functioning.   Healthcare consumer will: Actively participate in therapy, working towards healthy functioning.    *Justification for Continuation/Discontinuation of Goal: R=Revised, O=Ongoing, A=Achieved, D=Discontinued  Goal 1) Increase use of relaxation/mindfulness and descalation skills to reduce and cop w/ symptoms of anxiety.   Baseline date 11/25/22: Progress towards goal 0; How Often - Daily Target Date Goal Was reviewed Status Code Progress towards goal/Likert rating  11/25/23                Goal 2) Identify, challenge, and replace biased, fearful self-talk with positive, realistic, and empowering self-talk.  Baseline date 11/25/22: Progress towards goal 0; How Often - Daily Target Date Goal Was reviewed Status Code Progress towards goal  11/25/23                This plan has been reviewed and  created by the following participants:  This plan will be reviewed at least every 12 months. Date Behavioral Health Clinician Date Guardian/Patient   11/25/22  [facility] [name] 11/25/22 Verbal Consent Provided                          [name] [facility]

## 2023-04-16 ENCOUNTER — Other Ambulatory Visit: Payer: Self-pay | Admitting: Student

## 2023-04-16 DIAGNOSIS — I2089 Other forms of angina pectoris: Secondary | ICD-10-CM

## 2023-04-16 DIAGNOSIS — I773 Arterial fibromuscular dysplasia: Secondary | ICD-10-CM

## 2023-04-18 ENCOUNTER — Ambulatory Visit: Admitting: Psychology

## 2023-04-18 DIAGNOSIS — F89 Unspecified disorder of psychological development: Secondary | ICD-10-CM | POA: Diagnosis not present
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
## 2023-04-26 ENCOUNTER — Other Ambulatory Visit: Payer: Self-pay | Admitting: Dermatology

## 2023-04-28 ENCOUNTER — Ambulatory Visit (INDEPENDENT_AMBULATORY_CARE_PROVIDER_SITE_OTHER): Admitting: Psychology

## 2023-04-28 DIAGNOSIS — F411 Generalized anxiety disorder: Secondary | ICD-10-CM | POA: Diagnosis not present

## 2023-04-28 DIAGNOSIS — F4310 Post-traumatic stress disorder, unspecified: Secondary | ICD-10-CM

## 2023-04-28 NOTE — Progress Notes (Signed)
 [facility] Counselor/Therapist Progress Note  Patient ID: Isabel Foley, MRN: [id number],    Date: 04/28/2023  Time Spent: 11:04am-11:59am   Pt is seen for a virtual video visit.  Pt joins from her home reporting privacy, and counselor from her home office.  Pt consents to virtual visit and is aware of limitations of such visits.    Treatment Type: Individual Therapy  Reported Symptoms: pt reports worry w/ recent stressors for kids.  Pt discussed focusing on what's in her control ways to offer support.  Mental Status Exam: Appearance:  Well Groomed     Behavior: Appropriate  Motor: Normal  Speech/Language:  Clear and Coherent and Normal Rate  Affect: Appropriate  Mood: anxious  Thought process: normal  Thought content:   WNL  Sensory/Perceptual disturbances:   WNL  Orientation: oriented to person, place, time/date, and situation  Attention: Good  Concentration: Good  Memory: WNL  Fund of knowledge:  Good  Insight:   Good  Judgment:  Good  Impulse Control: Good   Risk Assessment: Danger to Self:  No Self-injurious Behavior: No Danger to Others: No Duty to Warn:no Physical Aggression / Violence:No  Access to Firearms a concern: No  Gang Involvement:No   Subjective: Counselor assessed pt current functioning per pt report.  Processed w/ pt recent stressors and worries.  Validated and normalized anxiety w/ recent stressors and assisted w/ reframing what ifs to manage anxiety.  Discussed upcoming medical procedures for husband and self and supports. Pt affect wnl.  Pt reported that her husband will have surgery to place a stint and she has another CT scan to assess her cardiac health.  Pt is able ot focus on low risk for husband and positive team of support.  Pt reports they had a good vacation, but did deal w/ stress of her son contact about ex's mental health and worry for him.  Pt discussed how she was able to offer support and recognize boundaries she needs to  keep.       Interventions: Cognitive Behavioral Therapy and Mindfulness Meditation  Diagnosis:GAD (generalized anxiety disorder)  PTSD (post-traumatic stress disorder)  Plan: Pt to f/u in 2 weeks w/ counseling. Pt referred for ADHD testing and given options for pursuing. Pt to f/u as scheduled w/ PCP and specialists.   Individualized Treatment Plan Strengths: enjoys travel and time w/ family  Supports: husband, friends/church community   Goal/Needs for Treatment:  In order of importance to patient 1) cope w/ anxiety 2) -- 3) ---   Client Statement of Needs: "Coping w/ the anxiety"   Treatment Level:outpatient counseling  Symptoms:anxiety/on edge, difficulty w/ worry, mind constantly going.  If slows down fatigue.    Client Treatment Preferences:biweekly counseling.  Continue  w/ PCP and specialists.   Healthcare consumer's goal for treatment:  Counselor, Clydie Darter, [name] will support the patient's ability to achieve the goals identified. Cognitive Behavioral Therapy, Assertive Communication/Conflict Resolution Training, Relaxation Training, ACT, Humanistic and other evidenced-based practices will be used to promote progress towards healthy functioning.   Healthcare consumer will: Actively participate in therapy, working towards healthy functioning.    *Justification for Continuation/Discontinuation of Goal: R=Revised, O=Ongoing, A=Achieved, D=Discontinued  Goal 1) Increase use of relaxation/mindfulness and descalation skills to reduce and cop w/ symptoms of anxiety.   Baseline date 11/25/22: Progress towards goal 0; How Often - Daily Target Date Goal Was reviewed Status Code Progress towards goal/Likert rating  11/25/23  Goal 2) Identify, challenge, and replace biased, fearful self-talk with positive, realistic, and empowering self-talk. Baseline date 11/25/22: Progress towards goal 0; How Often - Daily Target Date Goal Was reviewed Status Code Progress  towards goal  11/25/23                This plan has been reviewed and created by the following participants:  This plan will be reviewed at least every 12 months. Date Behavioral Health Clinician Date Guardian/Patient   11/25/22  [facility] Murrel [facility] 11/25/22 Verbal Consent Provided                           Clydie Darter LCM[facility]

## 2023-05-01 ENCOUNTER — Encounter (HOSPITAL_COMMUNITY): Payer: Self-pay

## 2023-05-01 ENCOUNTER — Telehealth (HOSPITAL_COMMUNITY): Payer: Self-pay | Admitting: *Deleted

## 2023-05-01 ENCOUNTER — Telehealth: Payer: Self-pay | Admitting: Pharmacy Technician

## 2023-05-01 MED ORDER — IVABRADINE HCL 7.5 MG PO TABS: ORAL_TABLET | ORAL | 0 refills | Status: DC

## 2023-05-01 NOTE — Telephone Encounter (Signed)
 Reaching out to patient to offer assistance regarding upcoming cardiac imaging study; pt verbalizes understanding of appt date/time, parking situation and where to check in, pre-test NPO status and medications ordered, and verified current allergies; name and call back number provided for further questions should they arise  Chase Copping RN Navigator Cardiac Imaging [facility] [phone] office [phone] cell  Patient to take 15mg  two hours prior to her cardiac CT scan.

## 2023-05-01 NOTE — Telephone Encounter (Signed)
 We got key [id number] for Ivabradine  HCl 7.5MG  tablet but she is only taking 2 tablets for prior to CT, insurance will not pay for it unless it is for daily. I called [facility] and asked them to run it on cash. Its 20.11

## 2023-05-05 ENCOUNTER — Ambulatory Visit
Admission: RE | Admit: 2023-05-05 | Discharge: 2023-05-05 | Disposition: A | Discharge status: Home / Self Care | Source: Ambulatory Visit | Attending: Student | Admitting: Student

## 2023-05-05 DIAGNOSIS — I773 Arterial fibromuscular dysplasia: Secondary | ICD-10-CM | POA: Diagnosis present

## 2023-05-05 DIAGNOSIS — I2089 Other forms of angina pectoris: Secondary | ICD-10-CM | POA: Diagnosis present

## 2023-05-05 MED ORDER — NITROGLYCERIN 0.4 MG SL SUBL
0.8000 mg | SUBLINGUAL_TABLET | Freq: Once | SUBLINGUAL | Status: AC
  Administered 2023-05-05: 0.8 mg via SUBLINGUAL
  Filled 2023-05-05: qty 25

## 2023-05-05 MED ORDER — NITROGLYCERIN 0.4 MG SL SUBL
SUBLINGUAL_TABLET | SUBLINGUAL | Status: AC
  Filled 2023-05-05: qty 2

## 2023-05-05 MED ORDER — DILTIAZEM HCL 25 MG/5ML IV SOLN
10.0000 mg | INTRAVENOUS | Status: DC | PRN
  Filled 2023-05-05: qty 5

## 2023-05-05 MED ORDER — IOHEXOL 350 MG/ML SOLN
80.0000 mL | Freq: Once | INTRAVENOUS | Status: AC | PRN
  Administered 2023-05-05: 80 mL via INTRAVENOUS

## 2023-05-05 MED ORDER — METOPROLOL TARTRATE 5 MG/5ML IV SOLN
10.0000 mg | INTRAVENOUS | Status: DC | PRN
  Filled 2023-05-05: qty 10

## 2023-05-08 ENCOUNTER — Encounter: Payer: Self-pay | Admitting: Family Medicine
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
## 2023-05-09 ENCOUNTER — Ambulatory Visit (INDEPENDENT_AMBULATORY_CARE_PROVIDER_SITE_OTHER): Admitting: Psychology

## 2023-05-09 ENCOUNTER — Other Ambulatory Visit: Payer: Self-pay | Admitting: Family Medicine

## 2023-05-09 DIAGNOSIS — F4310 Post-traumatic stress disorder, unspecified: Secondary | ICD-10-CM

## 2023-05-09 DIAGNOSIS — F902 Attention-deficit hyperactivity disorder, combined type: Secondary | ICD-10-CM | POA: Diagnosis not present

## 2023-05-09 DIAGNOSIS — E7800 Pure hypercholesterolemia, unspecified: Secondary | ICD-10-CM

## 2023-05-09 DIAGNOSIS — D500 Iron deficiency anemia secondary to blood loss (chronic): Secondary | ICD-10-CM

## 2023-05-09 NOTE — Telephone Encounter (Signed)
 Last office with 03/21/23 with [name] for URI.  Last refilled 03/04/23 for #10 with no refills.  Next Appt: CPE 06/17/23.

## 2023-05-12 ENCOUNTER — Encounter: Payer: Self-pay | Admitting: Family Medicine

## 2023-05-19 ENCOUNTER — Ambulatory Visit (INDEPENDENT_AMBULATORY_CARE_PROVIDER_SITE_OTHER): Admitting: Psychology

## 2023-05-19 DIAGNOSIS — F4310 Post-traumatic stress disorder, unspecified: Secondary | ICD-10-CM

## 2023-05-19 DIAGNOSIS — F902 Attention-deficit hyperactivity disorder, combined type: Secondary | ICD-10-CM | POA: Diagnosis not present

## 2023-05-19 DIAGNOSIS — F411 Generalized anxiety disorder: Secondary | ICD-10-CM | POA: Diagnosis not present

## 2023-05-19 NOTE — Progress Notes (Signed)
 [facility] Counselor/Therapist Progress Note  Patient ID: Isabel Foley, MRN: [id number],    Date: 05/19/2023  Time Spent: 10:56am-11:49am   Pt is seen for a virtual video visit.  Pt joins from her home reporting privacy, and counselor from her home office.  Pt consents to virtual visit and is aware of limitations of such visits.    Treatment Type: Individual Therapy  Reported Symptoms: pt reports worry re: her health  Mental Status Exam: Appearance:  Well Groomed     Behavior: Appropriate  Motor: Normal  Speech/Language:  Clear and Coherent and Normal Rate  Affect: Appropriate  Mood: anxious  Thought process: normal  Thought content:   WNL  Sensory/Perceptual disturbances:   WNL  Orientation: oriented to person, place, time/date, and situation  Attention: Good  Concentration: Good  Memory: WNL  Fund of knowledge:  Good  Insight:   Good  Judgment:  Good  Impulse Control: Good   Risk Assessment: Danger to Self:  No Self-injurious Behavior: No Danger to Others: No Duty to Warn:no Physical Aggression / Violence:No  Access to Firearms a concern: No  Gang Involvement:No   Subjective: Counselor assessed pt current functioning per pt report.  Processed w/ pt recent positives and stressors.  Explored pt want to minimize symptoms noticing and encouraged pt for self care steps.  Discussed fatigue, sleep hygiene and potential impact of stress.  Discussed recent dx of ADHD. Pt affect wnl.  Pt reported that her husband procedure found minimal blockage and no stint needed.  Pt reported that she has been dealing w/ some tightening of legs on recent walks and could be sign of dissection.  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  Pt discussed being back and forth on whether she thinks something is wrong on overacting. Pt acknowledged w/ risks and upcoming plans needs to r/o any concerns.  Pt discussed fatigue and some difficulty falling back asleep after  taking meds at 3am.  Pt discussed daughter leaving for boot camp in 2 weeks. Pt discussed ADHD dx received through testing and will f/u w/ her PCP re: medication options.      Interventions: Cognitive Behavioral Therapy and Mindfulness Meditation  Diagnosis:GAD (generalized anxiety disorder)  PTSD (post-traumatic stress disorder)  Attention deficit hyperactivity disorder (ADHD), combined type, moderate  Plan: Pt to f/u in 2 weeks w/ counseling. Pt referred for ADHD testing and given options for pursuing. Pt to f/u as scheduled w/ PCP and specialists.   Individualized Treatment Plan Strengths: enjoys travel and time w/ family  Supports: husband, friends/church community   Goal/Needs for Treatment:  In order of importance to patient 1) cope w/ anxiety 2) -- 3) ---   Client Statement of Needs: "Coping w/ the anxiety"   Treatment Level:outpatient counseling  Symptoms:anxiety/on edge, difficulty w/ worry, mind constantly going.  If slows down fatigue.    Client Treatment Preferences:biweekly counseling.  Continue  w/ PCP and specialists.   Healthcare consumer's goal for treatment:  Counselor, Clydie Darter, [facility] will support the patient's ability to achieve the goals identified. Cognitive Behavioral Therapy, Assertive Communication/Conflict Resolution Training, Relaxation Training, ACT, Humanistic and other evidenced-based practices will be used to promote progress towards healthy functioning.   Healthcare consumer will: Actively participate in therapy, working towards healthy functioning.    *Justification for Continuation/Discontinuation of Goal: R=Revised, O=Ongoing, A=Achieved, D=Discontinued  Goal 1) Increase use of relaxation/mindfulness and descalation skills to reduce and cop w/ symptoms of  anxiety.   Baseline date 11/25/22: Progress towards goal 0; How Often - Daily Target Date Goal Was reviewed Status Code Progress towards goal/Likert rating  11/25/23                 Goal 2) Identify, challenge, and replace biased, fearful self-talk with positive, realistic, and empowering self-talk. Baseline date 11/25/22: Progress towards goal 0; How Often - Daily Target Date Goal Was reviewed Status Code Progress towards goal  11/25/23                This plan has been reviewed and created by the following participants:  This plan will be reviewed at least every 12 months. Date Behavioral Health Clinician Date Guardian/Patient   11/25/22  [facility] Murrel Arnt [facility] 11/25/22 Verbal Consent Provided                     Clydie Darter LCMHC

## 2023-05-25 ENCOUNTER — Other Ambulatory Visit: Payer: Self-pay | Admitting: Dermatology

## 2023-06-09 ENCOUNTER — Other Ambulatory Visit (INDEPENDENT_AMBULATORY_CARE_PROVIDER_SITE_OTHER)

## 2023-06-09 DIAGNOSIS — E7800 Pure hypercholesterolemia, unspecified: Secondary | ICD-10-CM

## 2023-06-09 DIAGNOSIS — D500 Iron deficiency anemia secondary to blood loss (chronic): Secondary | ICD-10-CM

## 2023-06-09 LAB — COMPREHENSIVE METABOLIC PANEL WITH GFR
ALT: 11 U/L (ref 0–35)
AST: 16 U/L (ref 0–37)
Albumin: 4.2 g/dL (ref 3.5–5.2)
Alkaline Phosphatase: 36 U/L — ABNORMAL LOW (ref 39–117)
BUN: 14 mg/dL (ref 6–23)
CO2: 26 meq/L (ref 19–32)
Calcium: 9.2 mg/dL (ref 8.4–10.5)
Chloride: 106 meq/L (ref 96–112)
Creatinine, Ser: 0.81 mg/dL (ref 0.40–1.20)
GFR: 83.47 mL/min (ref >60.00)
Glucose, Bld: 79 mg/dL (ref 70–99)
Potassium: 4.5 meq/L (ref 3.5–5.1)
Sodium: 138 meq/L (ref 135–145)
Total Bilirubin: 0.5 mg/dL (ref 0.2–1.2)
Total Protein: 6.7 g/dL (ref 6.0–8.3)

## 2023-06-09 LAB — CBC WITH DIFFERENTIAL/PLATELET
Basophils Absolute: 0.1 10*3/uL (ref 0.0–0.1)
Basophils Relative: 0.9 % (ref 0.0–3.0)
Eosinophils Absolute: 0.1 10*3/uL (ref 0.0–0.7)
Eosinophils Relative: 0.9 % (ref 0.0–5.0)
HCT: 40.5 % (ref 36.0–46.0)
Hemoglobin: 13.6 g/dL (ref 12.0–15.0)
Lymphocytes Relative: 18 % (ref 12.0–46.0)
Lymphs Abs: 1.2 10*3/uL (ref 0.7–4.0)
MCHC: 33.5 g/dL (ref 30.0–36.0)
MCV: 94.8 fl (ref 78.0–100.0)
Monocytes Absolute: 0.7 10*3/uL (ref 0.1–1.0)
Monocytes Relative: 10.9 % (ref 3.0–12.0)
Neutro Abs: 4.6 10*3/uL (ref 1.4–7.7)
Neutrophils Relative %: 69.3 % (ref 43.0–77.0)
Platelets: 240 10*3/uL (ref 150.0–400.0)
RBC: 4.27 Mil/uL (ref 3.87–5.11)
RDW: 12.9 % (ref 11.5–15.5)
WBC: 6.6 10*3/uL (ref 4.0–10.5)

## 2023-06-09 LAB — LIPID PANEL
Cholesterol: 173 mg/dL (ref 0–200)
HDL: 59.5 mg/dL (ref >39.00)
LDL Cholesterol: 99 mg/dL (ref 0–99)
NonHDL: 113.34
Total CHOL/HDL Ratio: 3
Triglycerides: 72 mg/dL (ref 0.0–149.0)
VLDL: 14.4 mg/dL (ref 0.0–40.0)

## 2023-06-09 LAB — IBC + FERRITIN
Ferritin: 129.2 ng/mL (ref 10.0–291.0)
Iron: 104 ug/dL (ref 42–145)
Saturation Ratios: 37.5 % (ref 20.0–50.0)
TIBC: 277.2 ug/dL (ref 250.0–450.0)
Transferrin: 198 mg/dL — ABNORMAL LOW (ref 212.0–360.0)

## 2023-06-10 ENCOUNTER — Ambulatory Visit: Payer: Self-pay | Admitting: Family Medicine

## 2023-06-10 NOTE — Progress Notes (Signed)
 No critical labs need to be addressed urgently. We will discuss labs in detail at upcoming office visit.

## 2023-06-11 ENCOUNTER — Ambulatory Visit (INDEPENDENT_AMBULATORY_CARE_PROVIDER_SITE_OTHER): Admitting: Psychology

## 2023-06-11 DIAGNOSIS — F411 Generalized anxiety disorder: Secondary | ICD-10-CM | POA: Diagnosis not present

## 2023-06-11 DIAGNOSIS — F4310 Post-traumatic stress disorder, unspecified: Secondary | ICD-10-CM | POA: Diagnosis not present

## 2023-06-11 DIAGNOSIS — F902 Attention-deficit hyperactivity disorder, combined type: Secondary | ICD-10-CM

## 2023-06-11 NOTE — Progress Notes (Unsigned)
 [facility] Counselor/Therapist Progress Note  Patient ID: Isabel Foley, MRN: [id number],    Date: 06/11/2023  Time Spent: 11:04am-12:02pm   Pt is seen for a virtual video visit.  Pt joins from her home reporting privacy, and counselor from her home office.  Pt consents to virtual visit and is aware of limitations of such visits.    Treatment Type: Individual Therapy  Reported Symptoms: pt reports decreased anxiety, preparing for upcoming plans, worries still present and fatigue  Mental Status Exam: Appearance:  Well Groomed     Behavior: Appropriate  Motor: Normal  Speech/Language:  Clear and Coherent and Normal Rate  Affect: Appropriate  Mood: anxious  Thought process: normal  Thought content:   WNL  Sensory/Perceptual disturbances:   WNL  Orientation: oriented to person, place, time/date, and situation  Attention: Good  Concentration: Good  Memory: WNL  Fund of knowledge:  Good  Insight:   Good  Judgment:  Good  Impulse Control: Good   Risk Assessment: Danger to Self:  No Self-injurious Behavior: No Danger to Others: No Duty to Warn:no Physical Aggression / Violence:No  Access to Firearms a concern: No  Gang Involvement:No   Subjective: Counselor assessed pt current functioning per pt report.  Processed w/ pt recent positives, stressors and emotions.  Explored positive reports from recent tests and encouraging pt to continue to be advocate for self and seek provider guidance as needed.  Discussed send off of daughter to bootcamp and validated and normalized worries.  Explored upcoming plans and assisted in reframing distortions.  Pt affect wnl.  Pt reported that her recent CT scan showed positive progress and no new dissections.  Pt reports feels encouraged w/ this but still doesn't explain her fatigue and recent swelling w/ legs.  Pt reports she is following up w/ her providers next week and has list of her questions to ask.  Pt reports she is seeking w/  best ways of how to advocate for self.  Pt is considering medication for ADHD and will talk w/her providers more about options.  Pt reports that her daughter did leave for boot camp and able to sent her off.  Pt is worried that she might have been exposed to illness before left.  Pt reports is is difficult not being able to hear from her.  Pt reframing that positive experience and she can manage through.Pt has upcoming beach trip w/ grandsons and then travel planned to daughter's graduation from training and then family wedding.  Pt is reframing distorted worried thinking.    Interventions: Cognitive Behavioral Therapy and Mindfulness Meditation  Diagnosis:GAD (generalized anxiety disorder)  PTSD (post-traumatic stress disorder)  Attention deficit hyperactivity disorder (ADHD), combined type, moderate  Plan: Pt to f/u in 2 weeks w/ counseling. Pt referred for ADHD testing and given options for pursuing. Pt to f/u as scheduled w/ PCP and specialists.   Individualized Treatment Plan Strengths: enjoys travel and time w/ family  Supports: husband, friends/church community   Goal/Needs for Treatment:  In order of importance to patient 1) cope w/ anxiety 2) -- 3) ---   Client Statement of Needs: "Coping w/ the anxiety"   Treatment Level:outpatient counseling  Symptoms:anxiety/on edge, difficulty w/ worry, mind constantly going.  If slows down fatigue.    Client Treatment Preferences:biweekly counseling.  Continue  w/ PCP and specialists.   Healthcare consumer's goal for treatment:  Counselor, Clydie Darter, [facility] will support the patient's ability to achieve the goals identified. Cognitive Behavioral Therapy,  Assertive Communication/Conflict Resolution Training, Management consultant, ACT, Humanistic and other evidenced-based practices will be used to promote progress towards healthy functioning.   Healthcare consumer will: Actively participate in therapy, working towards healthy functioning.     *Justification for Continuation/Discontinuation of Goal: R=Revised, O=Ongoing, A=Achieved, D=Discontinued  Goal 1) Increase use of relaxation/mindfulness and descalation skills to reduce and cop w/ symptoms of anxiety.   Baseline date 11/25/22: Progress towards goal 0; How Often - Daily Target Date Goal Was reviewed Status Code Progress towards goal/Likert rating  11/25/23                Goal 2) Identify, challenge, and replace biased, fearful self-talk with positive, realistic, and empowering self-talk. Baseline date 11/25/22: Progress towards goal 0; How Often - Daily Target Date Goal Was reviewed Status Code Progress towards goal  11/25/23                This plan has been reviewed and created by the following participants:  This plan will be reviewed at least every 12 months. Date Behavioral Health Clinician Date Guardian/Patient   11/25/22  [facility] Murrel Arnt [facility] 11/25/22 Verbal Consent Provided                     Clydie Darter LCMHC

## 2023-06-17 ENCOUNTER — Encounter: Payer: Self-pay | Admitting: Family Medicine

## 2023-06-17 ENCOUNTER — Ambulatory Visit (INDEPENDENT_AMBULATORY_CARE_PROVIDER_SITE_OTHER): Admitting: Family Medicine

## 2023-06-17 VITALS — BP 110/70 | HR 63 | Temp 98.3°F | Ht 61.75 in | Wt 118.2 lb

## 2023-06-17 DIAGNOSIS — Z0000 Encounter for general adult medical examination without abnormal findings: Secondary | ICD-10-CM | POA: Diagnosis not present

## 2023-06-17 DIAGNOSIS — F325 Major depressive disorder, single episode, in full remission: Secondary | ICD-10-CM | POA: Diagnosis not present

## 2023-06-17 DIAGNOSIS — D500 Iron deficiency anemia secondary to blood loss (chronic): Secondary | ICD-10-CM

## 2023-06-17 DIAGNOSIS — R41840 Attention and concentration deficit: Secondary | ICD-10-CM

## 2023-06-17 DIAGNOSIS — I773 Arterial fibromuscular dysplasia: Secondary | ICD-10-CM

## 2023-06-17 DIAGNOSIS — E7800 Pure hypercholesterolemia, unspecified: Secondary | ICD-10-CM | POA: Diagnosis not present

## 2023-06-17 DIAGNOSIS — I728 Aneurysm of other specified arteries: Secondary | ICD-10-CM

## 2023-06-17 DIAGNOSIS — I7779 Dissection of other artery: Secondary | ICD-10-CM

## 2023-06-17 DIAGNOSIS — F411 Generalized anxiety disorder: Secondary | ICD-10-CM

## 2023-06-17 DIAGNOSIS — I722 Aneurysm of renal artery: Secondary | ICD-10-CM

## 2023-06-17 NOTE — Assessment & Plan Note (Signed)
 Chronic, followed by counselor.

## 2023-06-17 NOTE — Assessment & Plan Note (Signed)
 Resolved, Now resolved blood loss.  Iron  deficiency anemia resolved.  Per last team note Dr. Katrine Parody, no further IV infusions planned or likely needed.

## 2023-06-17 NOTE — Assessment & Plan Note (Signed)
 New diagnosis.  Struggles to stay organized, easily distracted.  Eval with Dr. Larey Plenty.

## 2023-06-17 NOTE — Progress Notes (Signed)
 Patient ID: Isabel Foley, female    DOB: 03-Sep-1970, 53 y.o.   MRN: [id number]  This visit was conducted in person.  BP 110/70   Pulse 63   Temp 98.3 F (36.8 C) (Oral)   Ht 5' 1.75" (1.568 m)   Wt 118 lb 4 oz (53.6 kg)   SpO2 96%   BMI 21.80 kg/m    CC:  Chief Complaint  Patient presents with   Annual Exam    Subjective:   HPI: Isabel Foley is a 53 y.o. 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 On Eliquis, plavix  anticoagulation. Surgical repair Yuvonne Herald [id number] at [address] [facility] Dr. Terral Ferrari  Dr.  Burdette [facility] in [place].  Recent OV with Rheumatology: Dr. Defoor.  Elevated ANA  Chronic left  knee pain: had X-ray: Mild medial compartment joint space loss.    Has upcoming OV with [facility] specialist Dr. Stefani Edin... having  leg pain. Starting in 3/202  CT lower aorta nml 06/07/2023   Symptoms have resolved in last 2-3 weeks.   She has continued to lose weight but is trying to drink ensure. Wt Readings from Last 3 Encounters:  06/17/23 118 lb 4 oz (53.6 kg)  03/21/23 121 lb (54.9 kg)  12/19/22 122 lb 2 oz (55.4 kg)  Body mass index is 21.8 kg/m.  MDD, GAD, PTSD, ADHD: Moderate control... seeing counselor  Daughter joined the [employer].     06/17/2023   12:12 PM 12/19/2022   12:02 PM 06/25/2022    1:29 PM  PHQ9 SCORE ONLY  PHQ-9 Total Score 14 7 5       06/17/2023   12:12 PM 12/19/2022   12:02 PM 06/25/2022    1:29 PM 02/20/2022    4:01 PM  GAD 7 : Generalized Anxiety Score  Nervous, Anxious, on Edge 2 3 3 3   Control/stop worrying 3 3 3 1   Worry too much - different things 2 3 3 3   Trouble relaxing 3 2 2 1   Restless 2 1 1 1   Easily annoyed or irritable 1 2 1 1   Afraid - awful might happen 3 2 1 1   Total GAD 7 Score 16 16 14 11   Anxiety Difficulty Somewhat difficult Somewhat difficult Not difficult at all Not difficult at all      Elevated Cholesterol:  LDL at goal < 100 Lab Results  Component Value Date   CHOL 173 06/09/2023   HDL 59.50 06/09/2023   LDLCALC 99 06/09/2023   TRIG 72.0 06/09/2023   CHOLHDL 3 06/09/2023   The 10-year ASCVD risk score (Arnett DK, et al., 2019) is: 0.8%   Values used to calculate the score:     Age: 70 years     Sex: Female     Is Non-Hispanic African American: No     Diabetic: No     Tobacco smoker: No     Systolic Blood Pressure: 110 mmHg     Is BP treated: No     HDL Cholesterol: 59.5 mg/dL     Total Cholesterol: 173 mg/dL Using medications without problems: Muscle aches:  Diet compliance: minimal given pain with dissection Exercise: none Other complaints:  working on starting low cholesterol, now able to eat everything.  Chronic migraines managed with topamax  25 mg daily preventative.   Multinodular goiter and family history of thyroid  disease... due for re-eval.  Heavy menses now resolved.. per Dr. Coley Davis does not  expect further iron  infusions needed. Rec checking iron  panel q 6 month.  Liver lesion: MRI liver with and without contrast from November 2024 Showed unchanged lesion in the hepatic segment 8 measuring 2.5 x 2 cm.  Consistent with benign focal nodular hyperplasia.  No further follow-up required.  Presence of hepatic parenchymal iron  deposition.  It was also noted on the MRI from April 2024.   Per Heme Dr. Katrine Parody # Hepatic iron  deposition -Noted on MRI liver done for liver lesion.  Unclear etiology.  Hepatitis C negative.  Will check for hepatitis B. Ferritin and iron  saturation has usually been normal or low.  Is less likely to have hereditary hemochromatosis.  But because there is no clear underlying etiology I will check her for [facility].  Denies any significant alcohol use.  No obesity.  Does not take OTC iron  supplements.  Inflammatory markers have been normal.  Has ANA positive and seen rheumatology with no clear diagnosis of any connective tissue  disease.  Heme per note  discussed MRI imaging with radiology.  Note stated: Difficult to tell if iron  deposition has progressed over the past 6 months since it is not a quantitative study.  Hemochromatosis gene panel is negative.  Hepatitis B/C nonreactive.  No alcohol use.  BMI within normal limits.  ESR has been normal in the past.  No apparent etiology of liver iron  deposition.  Her iron  panel and ferritin is within normal limit.  No clear indication for phlebotomy or iron  chelating agent.  She can continue to follow with Dr. Cherlyn Cornet for monitoring of iron  studies/ferritin and LFTs every 6 months.  She can be referred back to hematology if her iron  levels are high.    Relevant past medical, surgical, family and social history reviewed and updated as indicated. Interim medical history since our last visit reviewed. Allergies and medications reviewed and updated. Outpatient Medications Prior to Visit  Medication Sig Dispense Refill   acetaminophen  (TYLENOL ) 500 MG tablet Take 1,000 mg by mouth every 6 (six) hours as needed for moderate pain.     ALPRAZolam  (XANAX ) 0.5 MG tablet CAN USE 1 TAB DAILY AS NEEDED FOR ANXIETY ON LONG TRIPS. 20 tablet 0   apixaban (ELIQUIS) 5 MG TABS tablet Take 5 mg by mouth 2 (two) times daily.     aspirin  81 MG chewable tablet Chew 81 mg by mouth daily.     azelastine (OPTIVAR) 0.05 % ophthalmic solution Place 1 drop into both eyes 2 (two) times daily as needed (allergies).     cetirizine (ZYRTEC) 10 MG tablet Take 10 mg by mouth daily.     Cholecalciferol (DIALYVITE VITAMIN D  5000) 125 MCG (5000 UT) capsule Take 5,000 Units by mouth daily.     COLLAGEN PO Take 1 Scoop by mouth daily.     cyclobenzaprine  (FLEXERIL ) 5 MG tablet Take 5 mg by mouth 3 (three) times daily as needed for muscle spasms.     diphenhydrAMINE (BENADRYL) 25 MG tablet Take 25 mg by mouth at bedtime as needed for itching.     fexofenadine (ALLEGRA) 180 MG tablet Take 180 mg by mouth daily as needed  for allergies.  5   fluticasone  (FLONASE) 50 MCG/ACT nasal spray Place 2 sprays into both nostrils daily as needed for allergies.     hydrocortisone cream 1 % Apply 1 Application topically 2 (two) times daily as needed for itching.     ipratropium (ATROVENT) 0.06 % nasal spray Place into both nostrils.  levalbuterol (XOPENEX HFA) 45 MCG/ACT inhaler Inhale 1 puff into the lungs every 4 (four) hours as needed for wheezing.     levothyroxine (SYNTHROID) 25 MCG tablet Take 25 mcg by mouth every morning.     metroNIDAZOLE  (METROCREAM ) 0.75 % cream APPLY TO AFFECTED AREAS FACE ONCE TO TWICE DAILY FOR ROSACEA. 45 g 3   mometasone  (ELOCON ) 0.1 % cream APPLY TO AFFECTED AREAS RASH ON BODY ONCE TO TWICE DAILY AS NEEDED. AVOID APPLYING TO FACE, GROIN, AND AXILLA. USE AS DIRECTED. LONG-TERM USE CAN CAUSE THINNING OF THE SKIN. 45 g 1   montelukast (SINGULAIR) 10 MG tablet Take 10 mg by mouth at bedtime.     Multiple Vitamin (MULTIVITAMIN) tablet Take 1 tablet by mouth daily.     PREBIOTIC PRODUCT PO Take 1 capsule by mouth daily.     predniSONE  (DELTASONE ) 20 MG tablet Take by mouth.     predniSONE  (DELTASONE ) 20 MG tablet Take 1 tablet (20 mg total) by mouth daily with breakfast. 7 tablet 0   Probiotic Product (PROBIOTIC FORMULA PO) Take 1 tablet by mouth daily.     Ubrogepant (UBRELVY) 100 MG TABS Take 1 tablet by mouth daily as needed (For Migraines). Can repeat dose after 72 hours     albuterol  (VENTOLIN  HFA) 108 (90 Base) MCG/ACT inhaler Inhale 1-2 puffs into the lungs every 6 (six) hours as needed for wheezing or shortness of breath.     amoxicillin -clavulanate (AUGMENTIN ) 875-125 MG tablet Take 1 tablet by mouth 2 (two) times daily. 20 tablet 0   Fluticasone  Furoate (ARNUITY ELLIPTA ) 100 MCG/ACT AEPB Inhale into the lungs.     ivabradine  (CORLANOR) 7.5 MG TABS tablet Take tablets (15mg ) TWO hours prior to your cardiac CT scan. 2 tablet 0   No facility-administered medications prior to visit.      Per HPI unless specifically indicated in ROS section below Review of Systems  Constitutional:  Negative for fatigue and fever.  HENT:  Negative for ear pain.   Eyes:  Negative for pain.  Respiratory:  Negative for chest tightness and shortness of breath.   Cardiovascular:  Negative for chest pain, palpitations and leg swelling.  Gastrointestinal:  Negative for abdominal pain.  Genitourinary:  Negative for dysuria.   Objective:  BP 110/70   Pulse 63   Temp 98.3 F (36.8 C) (Oral)   Ht 5' 1.75" (1.568 m)   Wt 118 lb 4 oz (53.6 kg)   SpO2 96%   BMI 21.80 kg/m   Wt Readings from Last 3 Encounters:  06/17/23 118 lb 4 oz (53.6 kg)  03/21/23 121 lb (54.9 kg)  12/19/22 122 lb 2 oz (55.4 kg)      Physical Exam Vitals and nursing note reviewed.  Constitutional:      General: She is not in acute distress.    Appearance: Normal appearance. She is well-developed. She is not ill-appearing or toxic-appearing.  HENT:     Head: Normocephalic.     Right Ear: Hearing, tympanic membrane, ear canal and external ear normal.     Left Ear: Hearing, tympanic membrane, ear canal and external ear normal.     Nose: Nose normal.  Eyes:     General: Lids are normal. Lids are everted, no foreign bodies appreciated.     Conjunctiva/sclera: Conjunctivae normal.     Pupils: Pupils are equal, round, and reactive to light.  Neck:     Thyroid : No thyroid  mass or thyromegaly.     Vascular: No carotid  bruit.     Trachea: Trachea normal.  Cardiovascular:     Rate and Rhythm: Normal rate and regular rhythm.     Heart sounds: Normal heart sounds, S1 normal and S2 normal. No murmur heard.    No gallop.  Pulmonary:     Effort: Pulmonary effort is normal. No respiratory distress.     Breath sounds: Normal breath sounds. No wheezing, rhonchi or rales.  Abdominal:     General: Bowel sounds are normal. There is no distension or abdominal bruit.     Palpations: Abdomen is soft. There is no fluid wave or  mass.     Tenderness: There is no abdominal tenderness. There is no guarding or rebound.     Hernia: No hernia is present.  Musculoskeletal:     Cervical back: Normal range of motion and neck supple.  Lymphadenopathy:     Cervical: No cervical adenopathy.  Skin:    General: Skin is warm and dry.     Findings: No rash.  Neurological:     Mental Status: She is alert.     Cranial Nerves: No cranial nerve deficit.     Sensory: No sensory deficit.  Psychiatric:        Mood and Affect: Mood is not anxious or depressed.        Speech: Speech normal.        Behavior: Behavior normal. Behavior is cooperative.        Judgment: Judgment normal.       Results for orders placed or performed in visit on 06/09/23  Comprehensive metabolic panel with GFR   Collection Time: 06/09/23  9:33 AM  Result Value Ref Range   Sodium 138 135 - 145 mEq/L   Potassium 4.5 3.5 - 5.1 mEq/L   Chloride 106 96 - 112 mEq/L   CO2 26 19 - 32 mEq/L   Glucose, Bld 79 70 - 99 mg/dL   BUN 14 6 - 23 mg/dL   Creatinine, Ser 0.45 0.40 - 1.20 mg/dL   Total Bilirubin 0.5 0.2 - 1.2 mg/dL   Alkaline Phosphatase 36 (L) 39 - 117 U/L   AST 16 0 - 37 U/L   ALT 11 0 - 35 U/L   Total Protein 6.7 6.0 - 8.3 g/dL   Albumin 4.2 3.5 - 5.2 g/dL   GFR 40.98 >11.91 mL/min   Calcium 9.2 8.4 - 10.5 mg/dL  Lipid panel   Collection Time: 06/09/23  9:33 AM  Result Value Ref Range   Cholesterol 173 0 - 200 mg/dL   Triglycerides 47.8 0.0 - 149.0 mg/dL   HDL 29.56 >21.30 mg/dL   VLDL 86.5 0.0 - 78.4 mg/dL   LDL Cholesterol 99 0 - 99 mg/dL   Total CHOL/HDL Ratio 3    NonHDL 113.34   IBC + Ferritin   Collection Time: 06/09/23  9:33 AM  Result Value Ref Range   Iron  104 42 - 145 ug/dL   Transferrin 696.2 (L) 212.0 - 360.0 mg/dL   Saturation Ratios 95.2 20.0 - 50.0 %   Ferritin 129.2 10.0 - 291.0 ng/mL   TIBC 277.2 250.0 - 450.0 mcg/dL  CBC with Differential/Platelet   Collection Time: 06/09/23  9:33 AM  Result Value Ref Range    WBC 6.6 4.0 - 10.5 K/uL   RBC 4.27 3.87 - 5.11 Mil/uL   Hemoglobin 13.6 12.0 - 15.0 g/dL   HCT 84.1 32.4 - 40.1 %   MCV 94.8 78.0 - 100.0 fl   MCHC  33.5 30.0 - 36.0 g/dL   RDW 16.1 09.6 - 04.5 %   Platelets 240.0 150.0 - 400.0 K/uL   Neutrophils Relative % 69.3 43.0 - 77.0 %   Lymphocytes Relative 18.0 12.0 - 46.0 %   Monocytes Relative 10.9 3.0 - 12.0 %   Eosinophils Relative 0.9 0.0 - 5.0 %   Basophils Relative 0.9 0.0 - 3.0 %   Neutro Abs 4.6 1.4 - 7.7 K/uL   Lymphs Abs 1.2 0.7 - 4.0 K/uL   Monocytes Absolute 0.7 0.1 - 1.0 K/uL   Eosinophils Absolute 0.1 0.0 - 0.7 K/uL   Basophils Absolute 0.1 0.0 - 0.1 K/uL    This visit occurred during the SARS-CoV-2 public health emergency.  Safety protocols were in place, including screening questions prior to the visit, additional usage of staff PPE, and extensive cleaning of exam room while observing appropriate contact time as indicated for disinfecting solutions.   COVID 19 screen:  No recent travel or known exposure to COVID19 The patient denies respiratory symptoms of COVID 19 at this time. The importance of social distancing was discussed today.   Assessment and Plan The patient's preventative maintenance and recommended screening tests for an annual wellness exam were reviewed in full today. Brought up to date unless services declined.  Counselled on the importance of diet, exercise, and its role in overall health and mortality. The patient's FH and SH was reviewed, including their home life, tobacco status, and drug and alcohol status.   Vaccines: Consider shingrix. Pap/DVE: 08/05/19, repeat in 2026, Dr. Wayna Hails, GYN Mammo:  02/2023, US , breast MRI  yearly given dense breasts Colon:  06/2022 family history precancerous polyps.  Smoking Status: none ETOH/ drug use: none/none  Hep C:  due  HIV screen:  done  Problem List Items Addressed This Visit     Aneurysm of right renal artery (HCC)    Followed by vascular.       Aneurysm of splenic artery (HCC)   Followed by vascular.      Attention or concentration deficit    New diagnosis.  Struggles to stay organized, easily distracted.  Eval with Dr. Larey Plenty.      Depression, major, in remission [facility])    Seeing counselor.      Dissection of mesenteric artery (HCC)   S/P stent.  Followed by vascualr.      Fibromuscular dysplasia (HCC)    Chronic  Followed by Dr. Burdette Carolin Cardiology in [place].        GAD (generalized anxiety disorder)    Chronic, followed by counselor.      High iron  content of liver determined by magnetic resonance imaging   Unclear cause of iron  deposition in liver.  Iron  panels appear normal, LFTs normal. Negative workup for hemochromatosis.  Of note liver lesion seen at the same time on MRI stable over 6 months.  No further evaluation needed low.  Will continue to follow iron  panel/ferritin, LFTs and CBC every 6 months.      HYPERCHOLESTEROLEMIA   Chronic, LDL at goal.      Iron  deficiency anemia due to chronic blood loss   Resolved, Now resolved blood loss.  Iron  deficiency anemia resolved.  Per last team note Dr. Katrine Parody, no further IV infusions planned or likely needed.      Other Visit Diagnoses       Routine general medical examination at a health care facility    -  Primary  Herby Lolling, MD

## 2023-06-17 NOTE — Assessment & Plan Note (Signed)
Chronic  Followed by Dr. Maudie [facility] Cardiology in [place].

## 2023-06-17 NOTE — Assessment & Plan Note (Signed)
 Followed by vascular

## 2023-06-17 NOTE — Assessment & Plan Note (Addendum)
 Unclear cause of iron  deposition in liver.  Iron  panels appear normal, LFTs normal. Negative workup for hemochromatosis.  Of note liver lesion seen at the same time on MRI stable over 6 months.  No further evaluation needed low.  Will continue to follow iron  panel/ferritin, LFTs and CBC every 6 months.

## 2023-06-17 NOTE — Assessment & Plan Note (Signed)
 Chronic, LDL at goal.

## 2023-06-17 NOTE — Assessment & Plan Note (Signed)
S/P stent.  Followed by vascualr.

## 2023-06-17 NOTE — Assessment & Plan Note (Addendum)
Seeing counselor

## 2023-07-02 ENCOUNTER — Ambulatory Visit (INDEPENDENT_AMBULATORY_CARE_PROVIDER_SITE_OTHER): Admitting: Psychology

## 2023-07-02 DIAGNOSIS — F4310 Post-traumatic stress disorder, unspecified: Secondary | ICD-10-CM | POA: Diagnosis not present

## 2023-07-02 DIAGNOSIS — F902 Attention-deficit hyperactivity disorder, combined type: Secondary | ICD-10-CM | POA: Diagnosis not present

## 2023-07-02 DIAGNOSIS — F411 Generalized anxiety disorder: Secondary | ICD-10-CM | POA: Diagnosis not present

## 2023-07-02 NOTE — Progress Notes (Signed)
 Isabel Foley, Isabel Foley,    Date: 07/02/2023  Time Spent: 10:00am-11:02am   Pt is seen for a virtual video visit.  Pt joins from her home reporting privacy, and counselor from her home office.  Pt consents to virtual visit and is aware of limitations of such visits.    Treatment Type: Individual Therapy  Reported Symptoms: pt reports increasing anxiety as prepares for travel next month.   Mental Status Exam: Appearance:  Well Groomed     Behavior: Appropriate  Motor: Normal  Speech/Language:  Clear and Coherent and Normal Rate  Affect: Appropriate  Mood: anxious  Thought process: normal  Thought content:   WNL  Sensory/Perceptual disturbances:   WNL  Orientation: oriented to person, place, time/date, and situation  Attention: Good  Concentration: Good  Memory: WNL  Fund of knowledge:  Good  Insight:   Good  Judgment:  Good  Impulse Control: Good   Risk Assessment: Danger to Self:  No Self-injurious Behavior: No Danger to Others: No Duty to Warn:no Physical Aggression / Violence:No  Access to Firearms a concern: No  Gang Involvement:No   Subjective: Counselor assessed pt current functioning per pt report.  Processed w/ pt recent positives and stressors.  Explored contributing factors to increased anxiety.  assisted w/ naming and reframing anxious distortions.   Assisted in identifying daily grounding and relaxation skills practice to assist coping.  Pt affect wnl.  Pt reported that at recent visit no new dissections confirmed and others healed. Pt reports she does f/u w/ specialist in next week.  Pt reports some increased stress w/ preparing to leave and visit daughter's boot camp graduation next month.  Pt worries for leaving home, things being ok and for the plane travel.  Pt is able to acknowledge some distortions and reframe.  Pt also recognizes worry for her son's mental health.  Pt reports  he talked w/ her this week and receptive to seeking counseling.   Pt agrees for use of grounding skills to assist coping.    Interventions: Cognitive Behavioral Therapy and Mindfulness Meditation  Diagnosis:GAD (generalized anxiety disorder)  PTSD (post-traumatic stress disorder)  Attention deficit hyperactivity disorder (ADHD), combined type, moderate  Plan: Pt to f/u in 2 weeks w/ counseling.  Pt to f/u as scheduled w/ PCP and specialists.   Individualized Treatment Plan Strengths: enjoys travel and time w/ family  Supports: husband, friends/church community   Goal/Needs for Treatment:  In order of importance to patient 1) cope w/ anxiety 2) -- 3) ---   Client Statement of Needs: Coping w/ the anxiety   Treatment Level:outpatient counseling  Symptoms:anxiety/on edge, difficulty w/ worry, mind constantly going.  If slows down fatigue.    Client Treatment Preferences:biweekly counseling.  Continue  w/ PCP and specialists.   Healthcare consumer's goal for treatment:  Counselor, Damien Herald, [facility] will support the patient's ability to achieve the goals identified. Cognitive Behavioral Therapy, Assertive Communication/Conflict Resolution Training, Relaxation Training, ACT, Humanistic and other evidenced-based practices will be used to promote progress towards healthy functioning.   Healthcare consumer will: Actively participate in therapy, working towards healthy functioning.    *Justification for Continuation/Discontinuation of Goal: R=Revised, O=Ongoing, A=Achieved, D=Discontinued  Goal 1) Increase use of relaxation/mindfulness and descalation skills to reduce and cop w/ symptoms of anxiety.   Baseline date 11/25/22: Progress towards goal 0; How Often - Daily Target Date Goal Was reviewed Status Code Progress towards goal/Likert rating  11/25/23  Goal 2) Identify, challenge, and replace biased, fearful self-talk with positive, realistic, and empowering  self-talk. Baseline date 11/25/22: Progress towards goal 0; How Often - Daily Target Date Goal Was reviewed Status Code Progress towards goal  11/25/23                This plan has been reviewed and created by the following participants:  This plan will be reviewed at least every 12 months. Date Behavioral Health Clinician Date Guardian/Patient   11/25/22  [name] [facility] 11/25/22 Verbal Consent Provided                   [name] APPL [facility]

## 2023-07-07 ENCOUNTER — Encounter: Payer: Self-pay | Admitting: Family Medicine

## 2023-07-08 ENCOUNTER — Other Ambulatory Visit: Payer: Self-pay | Admitting: Family Medicine

## 2023-07-08 MED ORDER — VILOXAZINE HCL ER 200 MG PO CP24: 200.0000 mg | ORAL_CAPSULE | Freq: Every day | ORAL | 0 refills | Status: DC

## 2023-07-09 ENCOUNTER — Other Ambulatory Visit: Payer: Self-pay | Admitting: Family Medicine

## 2023-07-09 NOTE — Telephone Encounter (Signed)
 Pharmacy comment: Alternative Requested:NOT COVERED.

## 2023-07-10 ENCOUNTER — Telehealth: Payer: Self-pay

## 2023-07-10 ENCOUNTER — Other Ambulatory Visit (HOSPITAL_COMMUNITY): Payer: Self-pay

## 2023-07-10 ENCOUNTER — Encounter: Payer: Self-pay | Admitting: Oncology

## 2023-07-10 NOTE — Telephone Encounter (Signed)
 Pharmacy Patient Advocate Encounter   Received notification from Patient Pharmacy that prior authorization for Qelbree 200 is required/requested.   Insurance verification completed.   The patient is insured through [employer] . Patient has plan benefit exclusion for this medication.   Per test claim: PA required; PA submitted to above mentioned insurance via CoverMyMeds Key/confirmation #/EOC [id number] Status is pending

## 2023-07-10 NOTE — Telephone Encounter (Signed)
 Pharmacy Patient Advocate Encounter  Received notification from [employer] that Prior Authorization for Qelbree 200 has been DENIED.  No reason given; No denial letter received via Fax or CMM. It has been requested and will be uploaded to the media tab once received.   PA #/Case ID/Reference #: [id number]

## 2023-07-14 ENCOUNTER — Ambulatory Visit: Admitting: Psychology

## 2023-07-15 ENCOUNTER — Ambulatory Visit (INDEPENDENT_AMBULATORY_CARE_PROVIDER_SITE_OTHER): Admitting: Psychology

## 2023-07-15 DIAGNOSIS — F4310 Post-traumatic stress disorder, unspecified: Secondary | ICD-10-CM

## 2023-07-15 DIAGNOSIS — F411 Generalized anxiety disorder: Secondary | ICD-10-CM

## 2023-07-15 DIAGNOSIS — F902 Attention-deficit hyperactivity disorder, combined type: Secondary | ICD-10-CM
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
## 2023-07-21 ENCOUNTER — Other Ambulatory Visit: Payer: Self-pay | Admitting: Dermatology

## 2023-07-22 ENCOUNTER — Other Ambulatory Visit (HOSPITAL_COMMUNITY): Payer: Self-pay

## 2023-07-23 ENCOUNTER — Other Ambulatory Visit: Payer: Self-pay | Admitting: Obstetrics and Gynecology

## 2023-07-23 DIAGNOSIS — R92343 Mammographic extreme density, bilateral breasts: Secondary | ICD-10-CM

## 2023-07-23 MED ORDER — ATOMOXETINE HCL 40 MG PO CAPS: 40.0000 mg | ORAL_CAPSULE | Freq: Every day | ORAL | 0 refills | Status: DC

## 2023-07-23 NOTE — Addendum Note (Signed)
 Addended by: AVELINA NO E on: 07/23/2023 01:31 PM   Modules accepted: Orders

## 2023-07-28 ENCOUNTER — Encounter: Payer: Self-pay | Admitting: Family Medicine

## 2023-07-30 ENCOUNTER — Ambulatory Visit (INDEPENDENT_AMBULATORY_CARE_PROVIDER_SITE_OTHER): Admitting: Psychology

## 2023-07-30 DIAGNOSIS — F902 Attention-deficit hyperactivity disorder, combined type: Secondary | ICD-10-CM

## 2023-07-30 DIAGNOSIS — F411 Generalized anxiety disorder: Secondary | ICD-10-CM

## 2023-07-30 DIAGNOSIS — F4310 Post-traumatic stress disorder, unspecified: Secondary | ICD-10-CM

## 2023-07-30 NOTE — Progress Notes (Signed)
 [facility] Counselor/Therapist Progress Note  Patient ID: Cristal Qadir, MRN: [id number],    Date: 07/30/2023  Time Spent: 11:00am-12:03pm   Pt is seen for a virtual video visit.  Pt joins from her home reporting privacy, and counselor from her home office.  Pt consents to virtual visit and is aware of limitations of such visits.    Treatment Type: Individual Therapy  Reported Symptoms: pt reports increased anxiety w/ driving recent.  Pt reports on stressors w/ worry for kids.     Mental Status Exam: Appearance:  Well Groomed     Behavior: Appropriate  Motor: Normal  Speech/Language:  Clear and Coherent and Normal Rate  Affect: Appropriate  Mood: anxious  Thought process: normal  Thought content:   WNL  Sensory/Perceptual disturbances:   WNL  Orientation: oriented to person, place, time/date, and situation  Attention: Good  Concentration: Good  Memory: WNL  Fund of knowledge:  Good  Insight:   Good  Judgment:  Good  Impulse Control: Good   Risk Assessment: Danger to Self:  No Self-injurious Behavior: No Danger to Others: No Duty to Warn:no Physical Aggression / Violence:No  Access to Firearms a concern: No  Gang Involvement:No   Subjective: Counselor assessed pt current functioning per pt report.  Processed w/ pt recent positives and stressors.  Discussed anxiety and what if thinking distortions.  Explored challenges and reframes for what ifs thinking patterns and provided psychoeducation re: connection between thoughts and anxiety.    Pt affect wnl.  Pt reports visit to daughter and seeing her growth was good.  Pt reports plane ride was ok, but pt really struggled w/ riding in the car.  Pt reflected on pattern of what ifs and increased awareness of how feeding anxiety.  Pt was able to reframe w/ counselor assistance.  Pt shared about stressors w/ kids- teen and 108 y/o sneaking out while gone, adult daughter wanting to move back in but not receptive to rules  discussed.    Interventions: Cognitive Behavioral Therapy and Mindfulness Meditation  Diagnosis:GAD (generalized anxiety disorder)  PTSD (post-traumatic stress disorder)  Attention deficit hyperactivity disorder (ADHD), combined type, moderate  Plan: Pt to f/u in 2 weeks w/ counseling.  Pt to f/u as scheduled w/ PCP and specialists.   Individualized Treatment Plan Strengths: enjoys travel and time w/ family  Supports: husband, friends/church community   Goal/Needs for Treatment:  In order of importance to patient 1) cope w/ anxiety 2) -- 3) ---   Client Statement of Needs: Coping w/ the anxiety   Treatment Level:outpatient counseling  Symptoms:anxiety/on edge, difficulty w/ worry, mind constantly going.  If slows down fatigue.    Client Treatment Preferences:biweekly counseling.  Continue  w/ PCP and specialists.   Healthcare consumer's goal for treatment:  Counselor, Damien Herald, [facility] will support the patient's ability to achieve the goals identified. Cognitive Behavioral Therapy, Assertive Communication/Conflict Resolution Training, Relaxation Training, ACT, Humanistic and other evidenced-based practices will be used to promote progress towards healthy functioning.   Healthcare consumer will: Actively participate in therapy, working towards healthy functioning.    *Justification for Continuation/Discontinuation of Goal: R=Revised, O=Ongoing, A=Achieved, D=Discontinued  Goal 1) Increase use of relaxation/mindfulness and descalation skills to reduce and cop w/ symptoms of anxiety.   Baseline date 11/25/22: Progress towards goal 0; How Often - Daily Target Date Goal Was reviewed Status Code Progress towards goal/Likert rating  11/25/23  Goal 2) Identify, challenge, and replace biased, fearful self-talk with positive, realistic, and empowering self-talk. Baseline date 11/25/22: Progress towards goal 0; How Often - Daily Target Date Goal Was reviewed Status  Code Progress towards goal  11/25/23                This plan has been reviewed and created by the following participants:  This plan will be reviewed at least every 12 months. Date Behavioral Health Clinician Date Guardian/Patient   11/25/22  [facility] [name] [facility] 11/25/22 Verbal Consent Provided                       [name] APPL LCMHC

## 2023-08-04 ENCOUNTER — Ambulatory Visit
Admission: RE | Admit: 2023-08-04 | Discharge: 2023-08-04 | Disposition: A | Discharge status: Home / Self Care | Source: Ambulatory Visit | Attending: Obstetrics and Gynecology | Admitting: Obstetrics and Gynecology

## 2023-08-04 ENCOUNTER — Other Ambulatory Visit: Payer: Self-pay | Admitting: Family Medicine

## 2023-08-04 DIAGNOSIS — R92343 Mammographic extreme density, bilateral breasts: Secondary | ICD-10-CM | POA: Insufficient documentation

## 2023-08-04 MED ORDER — GADOBUTROL 1 MMOL/ML IV SOLN
5.0000 mL | Freq: Once | INTRAVENOUS | Status: AC | PRN
  Administered 2023-08-04: 5 mL via INTRAVENOUS

## 2023-08-05 ENCOUNTER — Encounter: Payer: Self-pay | Admitting: Family Medicine

## 2023-08-05 NOTE — Telephone Encounter (Signed)
 Copied from CRM [id number]. Topic: Clinical - Prescription Issue >> Aug 05, 2023  1:16 PM Mesmerise C wrote: Reason for CRM: Patient stated the .25mg  is for BP regulation and .50mg  for travel for her Xanax  and needing refill for .25mg 

## 2023-08-06 MED ORDER — ALPRAZOLAM 0.25 MG PO TABS: 0.2500 mg | ORAL_TABLET | Freq: Every day | ORAL | 0 refills | Status: AC | PRN

## 2023-08-17 ENCOUNTER — Other Ambulatory Visit: Payer: Self-pay | Admitting: Family Medicine

## 2023-08-18 NOTE — Telephone Encounter (Signed)
 Last office visit 06/17/23 for CPE.  Last refilled 07/23/23 for #30.  Pharmacy is asking for 90 day supply.  Next Appt: 08/27/23 for follow up Strattera .

## 2023-08-26 ENCOUNTER — Ambulatory Visit (INDEPENDENT_AMBULATORY_CARE_PROVIDER_SITE_OTHER): Admitting: Psychology

## 2023-08-26 DIAGNOSIS — F902 Attention-deficit hyperactivity disorder, combined type: Secondary | ICD-10-CM | POA: Diagnosis not present

## 2023-08-26 DIAGNOSIS — F4310 Post-traumatic stress disorder, unspecified: Secondary | ICD-10-CM | POA: Diagnosis not present

## 2023-08-26 DIAGNOSIS — F411 Generalized anxiety disorder: Secondary | ICD-10-CM | POA: Diagnosis not present

## 2023-08-26 NOTE — Progress Notes (Signed)
 [facility] Counselor/Therapist Progress Note  Patient ID: Isabel Foley, MRN: [id number],    Date: 08/26/2023  Time Spent: 11:00am-12:02pm   Pt is seen for a virtual video visit.  Pt joins from her home reporting privacy, and counselor from her home office.  Pt consents to virtual visit and is aware of limitations of such visits.    Treatment Type: Individual Therapy  Reported Symptoms: pt reports positive trips to see family.  Pt reports worry for her children.     Mental Status Exam: Appearance:  Well Groomed     Behavior: Appropriate  Motor: Normal  Speech/Language:  Clear and Coherent and Normal Rate  Affect: Appropriate  Mood: anxious  Thought process: normal  Thought content:   WNL  Sensory/Perceptual disturbances:   WNL  Orientation: oriented to person, place, time/date, and situation  Attention: Good  Concentration: Good  Memory: WNL  Fund of knowledge:  Good  Insight:   Good  Judgment:  Good  Impulse Control: Good   Risk Assessment: Danger to Self:  No Self-injurious Behavior: No Danger to Others: No Duty to Warn:no Physical Aggression / Violence:No  Access to Firearms a concern: No  Gang Involvement:No   Subjective: Counselor assessed pt current functioning per pt report.  Processed w/ pt recent positives and stressors.   Explored positives of travel and coping w/ anxiety.  Discussed worry for children, decisions they are making and supporting their decision making.  Assisted pt in recognizing what if distortions and reframing.    Pt affect wnl.  Pt reports she had a good trip to [place]  for wedding and [place]  to see her family and meet newest grandchild.  Pt reports things when well w/ the trip and her anxiety.  Pt daughter after training will be stationed in [place] and pt expressed worries about- what ifs.  Pt discussed worry for son and his mental health and daughter's decisions w/ next steps for job/housing.  Pt recognized what ifs and able  to reframe w/ facts and how can support.    Interventions: Cognitive Behavioral Therapy and supportive  Diagnosis:GAD (generalized anxiety disorder)  PTSD (post-traumatic stress disorder)  Attention deficit hyperactivity disorder (ADHD), combined type, moderate  Plan: Pt to f/u in 2 weeks w/ counseling.  Pt to f/u as scheduled w/ PCP and specialists.   Individualized Treatment Plan Strengths: enjoys travel and time w/ family  Supports: husband, friends/church community   Goal/Needs for Treatment:  In order of importance to patient 1) cope w/ anxiety 2) -- 3) ---   Client Statement of Needs: Coping w/ the anxiety   Treatment Level:outpatient counseling  Symptoms:anxiety/on edge, difficulty w/ worry, mind constantly going.  If slows down fatigue.    Client Treatment Preferences:biweekly counseling.  Continue  w/ PCP and specialists.   Healthcare consumer's goal for treatment:  Counselor, Isabel Foley, [facility] will support the patient's ability to achieve the goals identified. Cognitive Behavioral Therapy, Assertive Communication/Conflict Resolution Training, Relaxation Training, ACT, Humanistic and other evidenced-based practices will be used to promote progress towards healthy functioning.   Healthcare consumer will: Actively participate in therapy, working towards healthy functioning.    *Justification for Continuation/Discontinuation of Goal: R=Revised, O=Ongoing, A=Achieved, D=Discontinued  Goal 1) Increase use of relaxation/mindfulness and descalation skills to reduce and cop w/ symptoms of anxiety.   Baseline date 11/25/22: Progress towards goal 0; How Often - Daily Target Date Goal Was reviewed Status Code Progress towards goal/Likert rating  11/25/23  Goal 2) Identify, challenge, and replace biased, fearful self-talk with positive, realistic, and empowering self-talk. Baseline date 11/25/22: Progress towards goal 0; How Often - Daily Target Date Goal  Was reviewed Status Code Progress towards goal  11/25/23                This plan has been reviewed and created by the following participants:  This plan will be reviewed at least every 12 months. Date Behavioral Health Clinician Date Guardian/Patient   11/25/22  [facility] Isabel [facility] 11/25/22 Verbal Consent Provided                      Isabel Foley

## 2023-08-27 ENCOUNTER — Ambulatory Visit: Payer: Self-pay | Admitting: Family Medicine

## 2023-08-27 ENCOUNTER — Ambulatory Visit (INDEPENDENT_AMBULATORY_CARE_PROVIDER_SITE_OTHER): Admitting: Family Medicine

## 2023-08-27 VITALS — BP 102/70 | HR 81 | Temp 98.1°F | Ht 61.75 in | Wt 120.2 lb

## 2023-08-27 DIAGNOSIS — I773 Arterial fibromuscular dysplasia: Secondary | ICD-10-CM | POA: Diagnosis not present

## 2023-08-27 DIAGNOSIS — R109 Unspecified abdominal pain: Secondary | ICD-10-CM | POA: Insufficient documentation

## 2023-08-27 DIAGNOSIS — N951 Menopausal and female climacteric states: Secondary | ICD-10-CM | POA: Insufficient documentation

## 2023-08-27 DIAGNOSIS — I998 Other disorder of circulatory system: Secondary | ICD-10-CM | POA: Diagnosis not present

## 2023-08-27 DIAGNOSIS — R41840 Attention and concentration deficit: Secondary | ICD-10-CM

## 2023-08-27 LAB — COMPREHENSIVE METABOLIC PANEL WITH GFR
ALT: 10 U/L (ref 0–35)
AST: 18 U/L (ref 0–37)
Albumin: 4.1 g/dL (ref 3.5–5.2)
Alkaline Phosphatase: 41 U/L (ref 39–117)
BUN: 16 mg/dL (ref 6–23)
CO2: 29 meq/L (ref 19–32)
Calcium: 9 mg/dL (ref 8.4–10.5)
Chloride: 103 meq/L (ref 96–112)
Creatinine, Ser: 0.73 mg/dL (ref 0.40–1.20)
GFR: 94.42 mL/min (ref >60.00)
Glucose, Bld: 66 mg/dL — ABNORMAL LOW (ref 70–99)
Potassium: 4.1 meq/L (ref 3.5–5.1)
Sodium: 137 meq/L (ref 135–145)
Total Bilirubin: 0.4 mg/dL (ref 0.2–1.2)
Total Protein: 7 g/dL (ref 6.0–8.3)

## 2023-08-27 LAB — POC URINALSYSI DIPSTICK (AUTOMATED)
Bilirubin, UA: NEGATIVE
Blood, UA: NEGATIVE
Clarity, UA: NEGATIVE
Glucose, UA: NEGATIVE
Ketones, UA: NEGATIVE
Leukocytes, UA: NEGATIVE
Nitrite, UA: NEGATIVE
Protein, UA: NEGATIVE
Spec Grav, UA: 1.015 (ref 1.010–1.025)
Urobilinogen, UA: 0.2 U/dL
pH, UA: 6 (ref 5.0–8.0)

## 2023-08-27 LAB — IBC + FERRITIN
Ferritin: 119.6 ng/mL (ref 10.0–291.0)
Iron: 105 ug/dL (ref 42–145)
Saturation Ratios: 39.7 % (ref 20.0–50.0)
TIBC: 264.6 ug/dL (ref 250.0–450.0)
Transferrin: 189 mg/dL — ABNORMAL LOW (ref 212.0–360.0)

## 2023-08-27 LAB — ESTRADIOL: Estradiol: 132 pg/mL

## 2023-08-27 NOTE — Progress Notes (Signed)
 ADD Compliant with meds: Stattera started on 7/16 benefit from med (ie increase in concentration): maybe slight improvement in focus, but not much change in mood: none  No abdominal pain, no HA. change in appetite:  initially but better now.. has gained weight back. Insomnia: none tremor: none compliant with behavioral modification:   At home 100-130/70-91  BP Readings from Last 3 Encounters:  08/27/23 102/70  06/17/23 110/70  05/05/23 102/63   Wt Readings from Last 3 Encounters:  08/27/23 120 lb 3.2 oz (54.5 kg)  06/17/23 118 lb 4 oz (53.6 kg)  03/21/23 121 lb (54.9 kg)    Occ sharp pain in right kidney off and on.. ongoing x 1 week.  No dysuria, no frequency or urgency.    PMH and SH reviewed  ROS: Per HPI unless specifically indicated in ROS section   Meds, vitals, and allergies reviewed.   GEN: nad, alert and oriented, affect wnl and appropriate HEENT: mucous membranes moist NECK: supple w/o LA CV: rrr.  PULM: ctab, no inc wob ABD: soft, +bs EXT: no edema CN 2-12 wnl, s/s/dtr wnl x4.  No tremor.   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  Blood pressure fluctuating slightly but within normal range.  Patient request to continue Strattera  to see if more time may improve ADD symptoms.  She continues to see counselor for behavioral treatment.  Orders: -     Comprehensive metabolic panel with GFR  Fibromuscular dysplasia (HCC)  Fluctuating blood pressure  Flank pain Assessment & Plan: Acute, intermittent flank pain.  Will check urinalysis to make sure no associated hematuria or sign of infection.  Orders: -     POCT Urinalysis Dipstick (Automated)  Menopausal symptoms Assessment & Plan: Chronic, she requests repeat estradiol  evaluation.  We did discuss that this is difficult to follow for a trend given fluctuation that is normal with different times in her  cycle.  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  P plan every 6 month CBC and iron  panel.  Check in office today.  Orders: -     IBC + Ferritin

## 2023-08-27 NOTE — Assessment & Plan Note (Signed)
 Chronic, past iron  deposition in the liver.  P plan every 6 month CBC and iron  panel.  Check in office today.

## 2023-08-27 NOTE — Assessment & Plan Note (Signed)
 Acute, intermittent flank pain.  Will check urinalysis to make sure no associated hematuria or sign of infection.

## 2023-08-27 NOTE — Assessment & Plan Note (Signed)
 Chronic, she requests repeat estradiol  evaluation.  We did discuss that this is difficult to follow for a trend given fluctuation that is normal with different times in her cycle.

## 2023-08-27 NOTE — Assessment & Plan Note (Signed)
 Chronic, no significant benefit so far from Strattera .  Blood pressure fluctuating slightly but within normal range.  Patient request to continue Strattera  to see if more time may improve ADD symptoms.  She continues to see counselor for behavioral treatment.

## 2023-09-01 ENCOUNTER — Encounter: Payer: Self-pay | Admitting: Family Medicine

## 2023-09-11 ENCOUNTER — Ambulatory Visit (INDEPENDENT_AMBULATORY_CARE_PROVIDER_SITE_OTHER): Admitting: Family Medicine

## 2023-09-11 VITALS — BP 118/88 | HR 86 | Temp 99.0°F | Ht 61.0 in | Wt 120.4 lb

## 2023-09-11 DIAGNOSIS — M357 Hypermobility syndrome: Secondary | ICD-10-CM | POA: Insufficient documentation

## 2023-09-11 DIAGNOSIS — G5603 Carpal tunnel syndrome, bilateral upper limbs: Secondary | ICD-10-CM | POA: Diagnosis not present

## 2023-09-11 NOTE — Assessment & Plan Note (Signed)
 Chronic, recommended patient to wear at least right carpal tunnel brace at bedtime.  We discussed briefly gabapentin suggested by her neurologist.  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 Otherwise she can use Tylenol .

## 2023-09-11 NOTE — Assessment & Plan Note (Signed)
>   10 hyper flexion of pinkies, elbows, knees bilaterally  Also able to place hands flat on floor when leaning over with knee fairly straight   > 5 on  Beighton Scale  Referred patient to PM&R EDS specialist Dr. Ozzie at [facility] per patient request.

## 2023-09-11 NOTE — Progress Notes (Signed)
 Patient ID: Isabel Foley, female    DOB: [birth year], 53 y.o.   MRN: [id number]  This visit was conducted in person.  BP 118/88   Pulse 86   Temp 99 F (37.2 C) (Oral)   Ht 5' 1 (1.549 m)   Wt 120 lb 6 oz (54.6 kg)   SpO2 99%   BMI 22.74 kg/m    CC:  Chief Complaint  Patient presents with   Medical Management of Chronic Issues    Pt here for Hypermobility evaluation    Subjective:   HPI: Isabel Foley is a 53 y.o. female presenting on 09/11/2023 for Medical Management of Chronic Issues (Pt here for Hypermobility evaluation)    She continues to have joint pains... neg rheum eval in past.  Dx with carpal tunnel on right by nerve conduction.. starting to wear brace.  Referred to Ortho  Has chronic joint pains..  Hip, knee, hands,  toes and finger  Has FMD  connective tissues disease.   Per pt had negative Ehler's Danlos vascular genetic testing at [facility] with Geneticist.  No available hypermobility genetic testing.   She requests referral  to EDS specialist at [facility]. Her current Rheum cannot assess for it.  [facility] Dr. Jama [place]  ADD.. BP well controlled.. focus seems to be better.  Relevant past medical, surgical, family and social history reviewed and updated as indicated. Interim medical history since our last visit reviewed. Allergies and medications reviewed and updated. Outpatient Medications Prior to Visit  Medication Sig Dispense Refill   acetaminophen  (TYLENOL ) 500 MG tablet Take 1,000 mg by mouth every 6 (six) hours as needed for moderate pain.     albuterol  (VENTOLIN  HFA) 108 (90 Base) MCG/ACT inhaler INHALE 2 PUFFS EVERY 4 TO 6 HOURS AS NEEDED FOR COUGH /WHEEZE     ALPRAZolam  (XANAX ) 0.25 MG tablet Take 1-2 tablets (0.25-0.5 mg total) by mouth daily as needed (BP > 140/90). 30 tablet 0   ALPRAZolam  (XANAX ) 0.5 MG tablet CAN USE 1 TAB DAILY AS NEEDED FOR ANXIETY ON LONG TRIPS. 20 tablet 0   apixaban (ELIQUIS) 5 MG TABS tablet Take 5 mg by  mouth 2 (two) times daily.     aspirin  81 MG chewable tablet Chew 81 mg by mouth daily.     atomoxetine  (STRATTERA ) 40 MG capsule TAKE 1 CAPSULE (40 MG TOTAL) BY MOUTH DAILY. 90 capsule 0   azelastine (OPTIVAR) 0.05 % ophthalmic solution Place 1 drop into both eyes 2 (two) times daily as needed (allergies).     cetirizine (ZYRTEC) 10 MG tablet Take 10 mg by mouth daily.     Cholecalciferol (DIALYVITE VITAMIN D  5000) 125 MCG (5000 UT) capsule Take 5,000 Units by mouth daily.     COLLAGEN PO Take 1 Scoop by mouth daily.     cyanocobalamin  (VITAMIN B12) 1000 MCG tablet Take 1,000 mcg by mouth.     cyclobenzaprine  (FLEXERIL ) 5 MG tablet Take 5 mg by mouth 3 (three) times daily as needed for muscle spasms.     diphenhydrAMINE (BENADRYL) 25 MG tablet Take 25 mg by mouth at bedtime as needed for itching.     fexofenadine (ALLEGRA) 180 MG tablet Take 180 mg by mouth daily as needed for allergies.  5   fluticasone  (FLONASE) 50 MCG/ACT nasal spray Place 2 sprays into both nostrils daily as needed for allergies.     hydrocortisone cream 1 % Apply 1 Application topically 2 (two) times daily as needed for itching.  ipratropium (ATROVENT) 0.06 % nasal spray Place into both nostrils.     levalbuterol (XOPENEX HFA) 45 MCG/ACT inhaler Inhale 1 puff into the lungs every 4 (four) hours as needed for wheezing.     levothyroxine (SYNTHROID) 25 MCG tablet Take 25 mcg by mouth every morning.     metroNIDAZOLE  (METROCREAM ) 0.75 % cream APPLY TO AFFECTED AREAS FACE ONCE TO TWICE DAILY FOR ROSACEA. 45 g 3   metroNIDAZOLE  (METROCREAM ) 0.75 % cream Apply topically 2 (two) times daily.     mometasone  (ELOCON ) 0.1 % cream APPLY TO AFFECTED AREAS RASH ON BODY ONCE TO TWICE DAILY AS NEEDED. AVOID APPLYING TO FACE, GROIN, AND AXILLA. USE AS DIRECTED. LONG-TERM USE CAN CAUSE THINNING OF THE SKIN. 45 g 1   montelukast (SINGULAIR) 10 MG tablet Take 10 mg by mouth at bedtime.     Multiple Vitamin (MULTIVITAMIN) tablet Take 1  tablet by mouth daily.     Multiple Vitamins-Minerals (HAIR SKIN & NAILS PO) Take by mouth.     OVER THE COUNTER MEDICATION Plexus pre & probiotic     PREBIOTIC PRODUCT PO Take 1 capsule by mouth daily.     predniSONE  (DELTASONE ) 20 MG tablet Take by mouth.     predniSONE  (DELTASONE ) 20 MG tablet Take 1 tablet (20 mg total) by mouth daily with breakfast. 7 tablet 0   Probiotic Product (PROBIOTIC FORMULA PO) Take 1 tablet by mouth daily.     Ubrogepant (UBRELVY) 100 MG TABS Take 1 tablet by mouth daily as needed (For Migraines). Can repeat dose after 72 hours     No facility-administered medications prior to visit.     Per HPI unless specifically indicated in ROS section below Review of Systems  Constitutional:  Negative for fatigue and fever.  HENT:  Negative for congestion.   Eyes:  Negative for pain.  Respiratory:  Negative for cough and shortness of breath.   Cardiovascular:  Negative for chest pain, palpitations and leg swelling.  Gastrointestinal:  Negative for abdominal pain.  Genitourinary:  Negative for dysuria and vaginal bleeding.  Musculoskeletal:  Positive for arthralgias. Negative for back pain.  Neurological:  Negative for syncope, light-headedness and headaches.  Psychiatric/Behavioral:  Negative for dysphoric mood.    Objective:  BP 118/88   Pulse 86   Temp 99 F (37.2 C) (Oral)   Ht 5' 1 (1.549 m)   Wt 120 lb 6 oz (54.6 kg)   SpO2 99%   BMI 22.74 kg/m   Wt Readings from Last 3 Encounters:  09/11/23 120 lb 6 oz (54.6 kg)  08/27/23 120 lb 3.2 oz (54.5 kg)  06/17/23 118 lb 4 oz (53.6 kg)      Physical Exam Constitutional:      General: She is not in acute distress.    Appearance: Normal appearance. She is well-developed. She is not ill-appearing or toxic-appearing.  HENT:     Head: Normocephalic.     Right Ear: Hearing, tympanic membrane, ear canal and external ear normal. Tympanic membrane is not erythematous, retracted or bulging.     Left Ear:  Hearing, tympanic membrane, ear canal and external ear normal. Tympanic membrane is not erythematous, retracted or bulging.     Nose: No mucosal edema or rhinorrhea.     Right Sinus: No maxillary sinus tenderness or frontal sinus tenderness.     Left Sinus: No maxillary sinus tenderness or frontal sinus tenderness.     Mouth/Throat:     Pharynx: Uvula midline.  Eyes:  General: Lids are normal. Lids are everted, no foreign bodies appreciated.     Conjunctiva/sclera: Conjunctivae normal.     Pupils: Pupils are equal, round, and reactive to light.  Neck:     Thyroid : No thyroid  mass or thyromegaly.     Vascular: No carotid bruit.     Trachea: Trachea normal.  Cardiovascular:     Rate and Rhythm: Normal rate and regular rhythm.     Pulses: Normal pulses.     Heart sounds: Normal heart sounds, S1 normal and S2 normal. No murmur heard.    No friction rub. No gallop.  Pulmonary:     Effort: Pulmonary effort is normal. No tachypnea or respiratory distress.     Breath sounds: Normal breath sounds. No decreased breath sounds, wheezing, rhonchi or rales.  Abdominal:     General: Bowel sounds are normal.     Palpations: Abdomen is soft.     Tenderness: There is no abdominal tenderness.  Musculoskeletal:     Cervical back: Normal range of motion and neck supple.     Comments:  > 10 hyper flexion of pinkies, elbows, knees bilaterally  Also able to place hands flat on floor when leaning over with knee fairly straight   > 5 on  Beighton Scale  Skin:    General: Skin is warm and dry.     Findings: No rash.  Neurological:     Mental Status: She is alert.  Psychiatric:        Mood and Affect: Mood is not anxious or depressed.        Speech: Speech normal.        Behavior: Behavior normal. Behavior is cooperative.        Thought Content: Thought content normal.        Judgment: Judgment normal.       Results for orders placed or performed in visit on 08/27/23  Comprehensive metabolic  panel with GFR   Collection Time: 08/27/23 11:59 AM  Result Value Ref Range   Sodium 137 135 - 145 mEq/L   Potassium 4.1 3.5 - 5.1 mEq/L   Chloride 103 96 - 112 mEq/L   CO2 29 19 - 32 mEq/L   Glucose, Bld 66 (L) 70 - 99 mg/dL   BUN 16 6 - 23 mg/dL   Creatinine, Ser 9.26 0.40 - 1.20 mg/dL   Total Bilirubin 0.4 0.2 - 1.2 mg/dL   Alkaline Phosphatase 41 39 - 117 U/L   AST 18 0 - 37 U/L   ALT 10 0 - 35 U/L   Total Protein 7.0 6.0 - 8.3 g/dL   Albumin 4.1 3.5 - 5.2 g/dL   GFR 05.57 >39.99 mL/min   Calcium 9.0 8.4 - 10.5 mg/dL  Estradiol    Collection Time: 08/27/23 11:59 AM  Result Value Ref Range   Estradiol  132 pg/mL  IBC + Ferritin   Collection Time: 08/27/23 11:59 AM  Result Value Ref Range   Iron  105 42 - 145 ug/dL   Transferrin 810.9 (L) 212.0 - 360.0 mg/dL   Saturation Ratios 60.2 20.0 - 50.0 %   Ferritin 119.6 10.0 - 291.0 ng/mL   TIBC 264.6 250.0 - 450.0 mcg/dL  POCT Urinalysis Dipstick (Automated)   Collection Time: 08/27/23 12:47 PM  Result Value Ref Range   Color, UA light yellow    Clarity, UA neg    Glucose, UA Negative Negative   Bilirubin, UA neg    Ketones, UA neg    Spec Grav,  UA 1.015 1.010 - 1.025   Blood, UA neg    pH, UA 6.0 5.0 - 8.0   Protein, UA Negative Negative   Urobilinogen, UA 0.2 0.2 or 1.0 E.U./dL   Nitrite, UA neg    Leukocytes, UA Negative Negative    Assessment and Plan  Hypermobility syndrome Assessment & Plan:  > 10 hyper flexion of pinkies, elbows, knees bilaterally  Also able to place hands flat on floor when leaning over with knee fairly straight   > 5 on  Beighton Scale  Referred patient to PM&R EDS specialist Dr. Ozzie at [facility] per patient request.  Orders: -     Ambulatory referral to Physical Medicine Rehab  Bilateral carpal tunnel syndrome Assessment & Plan: Chronic, recommended patient to wear at least right carpal tunnel brace at bedtime.  We discussed briefly gabapentin suggested by her neurologist.  It  appears that gabapentin is safe to take with FMD but we did discuss possible side effects. Otherwise she can use Tylenol .     No follow-ups on file.   Greig Ring, MD

## 2023-09-12 ENCOUNTER — Encounter: Payer: Self-pay | Admitting: Family Medicine

## 2023-09-16 ENCOUNTER — Ambulatory Visit: Admitting: Psychology

## 2023-09-16 DIAGNOSIS — F411 Generalized anxiety disorder: Secondary | ICD-10-CM | POA: Diagnosis not present

## 2023-09-16 DIAGNOSIS — F4310 Post-traumatic stress disorder, unspecified: Secondary | ICD-10-CM | POA: Diagnosis not present

## 2023-09-16 DIAGNOSIS — F902 Attention-deficit hyperactivity disorder, combined type: Secondary | ICD-10-CM

## 2023-09-16 NOTE — Progress Notes (Signed)
 [facility] Counselor/Therapist Progress Note  Patient ID: Isabel Foley, MRN: [id number],    Date: 09/16/2023  Time Spent: 11:01am-12:01pm   Pt is seen for a virtual video visit.  Pt joins from her home reporting privacy, and counselor from her home office.  Pt consents to virtual visit and is aware of limitations of such visits.    Treatment Type: Individual Therapy  Reported Symptoms: pt reports some worries for her health, pt able to reflect on positives in her life.     Mental Status Exam: Appearance:  Well Groomed     Behavior: Appropriate  Motor: Normal  Speech/Language:  Clear and Coherent and Normal Rate  Affect: Appropriate  Mood: anxious  Thought process: normal  Thought content:   WNL  Sensory/Perceptual disturbances:   WNL  Orientation: oriented to person, place, time/date, and situation  Attention: Good  Concentration: Good  Memory: WNL  Fund of knowledge:  Good  Insight:   Good  Judgment:  Good  Impulse Control: Good   Risk Assessment: Danger to Self:  No Self-injurious Behavior: No Danger to Others: No Duty to Warn:no Physical Aggression / Violence:No  Access to Firearms a concern: No  Gang Involvement:No   Subjective: Counselor assessed pt current functioning per pt report.  Processed w/ pt recent positives and stressors.  Explored worries with health concerns/sensations that concern.  Discussed ways of coping focus on facts and when to reach out to providers.  Assisted pt in reflecting on positives seeing her life and supporting kids and progress they are making. .  Discussed worry for children, decisions they are making and supporting their decision making.  Pt affect wnl.  Pt reports on positive labor day family get together.  Pt feels encouraged by progress she is seeing for adult children and steps they are taking.  Pt discussed some worries for her health- discomfort, sensations experiences as times that may be related symptoms of her  FMD.  Pt discussed how reframe castrophizing, monitoring, informer supports and when to reach out to her providers.      Interventions: Cognitive Behavioral Therapy and supportive  Diagnosis:No diagnosis found.  Plan: Pt to f/u in 2 weeks w/ counseling.  Pt to f/u as scheduled w/ PCP and specialists.   Individualized Treatment Plan Strengths: enjoys travel and time w/ family  Supports: husband, friends/church community   Goal/Needs for Treatment:  In order of importance to patient 1) cope w/ anxiety 2) -- 3) ---   Client Statement of Needs: Coping w/ the anxiety   Treatment Level:outpatient counseling  Symptoms:anxiety/on edge, difficulty w/ worry, mind constantly going.  If slows down fatigue.    Client Treatment Preferences:biweekly counseling.  Continue  w/ PCP and specialists.   Healthcare consumer's goal for treatment:  Counselor, Damien Herald, [facility] will support the patient's ability to achieve the goals identified. Cognitive Behavioral Therapy, Assertive Communication/Conflict Resolution Training, Relaxation Training, ACT, Humanistic and other evidenced-based practices will be used to promote progress towards healthy functioning.   Healthcare consumer will: Actively participate in therapy, working towards healthy functioning.    *Justification for Continuation/Discontinuation of Goal: R=Revised, O=Ongoing, A=Achieved, D=Discontinued  Goal 1) Increase use of relaxation/mindfulness and descalation skills to reduce and cop w/ symptoms of anxiety.   Baseline date 11/25/22: Progress towards goal 0; How Often - Daily Target Date Goal Was reviewed Status Code Progress towards goal/Likert rating  11/25/23                Goal  2) Identify, challenge, and replace biased, fearful self-talk with positive, realistic, and empowering self-talk. Baseline date 11/25/22: Progress towards goal 0; How Often - Daily Target Date Goal Was reviewed Status Code Progress towards goal   11/25/23                This plan has been reviewed and created by the following participants:  This plan will be reviewed at least every 12 months. Date Behavioral Health Clinician Date Guardian/Patient   11/25/22  [facility] 11/25/22 Verbal Consent Provided                         Isabel Foley LCMHC

## 2023-09-17 ENCOUNTER — Ambulatory Visit: Admitting: Psychology

## 2023-09-19 ENCOUNTER — Telehealth: Payer: Self-pay | Admitting: Family Medicine

## 2023-09-19 NOTE — Addendum Note (Signed)
 Addended by: AVELINA NO E on: 09/19/2023 06:02 PM   Modules accepted: Orders

## 2023-09-19 NOTE — Telephone Encounter (Signed)
 My Chart message sent

## 2023-09-26 ENCOUNTER — Other Ambulatory Visit: Payer: Self-pay

## 2023-09-26 ENCOUNTER — Emergency Department

## 2023-09-26 ENCOUNTER — Emergency Department
Admission: EM | Admit: 2023-09-26 | Discharge: 2023-09-26 | Disposition: A | Discharge status: Home / Self Care | Source: Ambulatory Visit | Attending: Emergency Medicine | Admitting: Emergency Medicine

## 2023-09-26 ENCOUNTER — Ambulatory Visit: Payer: Self-pay

## 2023-09-26 DIAGNOSIS — J189 Pneumonia, unspecified organism: Secondary | ICD-10-CM | POA: Insufficient documentation

## 2023-09-26 DIAGNOSIS — R059 Cough, unspecified: Secondary | ICD-10-CM | POA: Insufficient documentation

## 2023-09-26 DIAGNOSIS — D72829 Elevated white blood cell count, unspecified: Secondary | ICD-10-CM | POA: Insufficient documentation

## 2023-09-26 DIAGNOSIS — R079 Chest pain, unspecified: Secondary | ICD-10-CM

## 2023-09-26 DIAGNOSIS — Z7901 Long term (current) use of anticoagulants: Secondary | ICD-10-CM | POA: Insufficient documentation

## 2023-09-26 DIAGNOSIS — J45909 Unspecified asthma, uncomplicated: Secondary | ICD-10-CM | POA: Diagnosis not present

## 2023-09-26 DIAGNOSIS — Z7982 Long term (current) use of aspirin: Secondary | ICD-10-CM | POA: Diagnosis not present

## 2023-09-26 LAB — CBC
HCT: 37.5 % (ref 36.0–46.0)
Hemoglobin: 12.5 g/dL (ref 12.0–15.0)
MCH: 31.9 pg (ref 26.0–34.0)
MCHC: 33.3 g/dL (ref 30.0–36.0)
MCV: 95.7 fL (ref 80.0–100.0)
Platelets: 247 K/uL (ref 150–400)
RBC: 3.92 MIL/uL (ref 3.87–5.11)
RDW: 13.2 % (ref 11.5–15.5)
WBC: 16.5 K/uL — ABNORMAL HIGH (ref 4.0–10.5)
nRBC: 0 % (ref 0.0–0.2)

## 2023-09-26 LAB — BASIC METABOLIC PANEL WITH GFR
Anion gap: 9 (ref 5–15)
BUN: 16 mg/dL (ref 6–20)
CO2: 24 mmol/L (ref 22–32)
Calcium: 9.1 mg/dL (ref 8.9–10.3)
Chloride: 105 mmol/L (ref 98–111)
Creatinine, Ser: 0.8 mg/dL (ref 0.44–1.00)
GFR, Estimated: >60 mL/min (ref >60)
Glucose, Bld: 124 mg/dL — ABNORMAL HIGH (ref 70–99)
Potassium: 3.8 mmol/L (ref 3.5–5.1)
Sodium: 138 mmol/L (ref 135–145)

## 2023-09-26 LAB — RESP PANEL BY RT-PCR (RSV, FLU A&B, COVID)  RVPGX2
Influenza A by PCR: NEGATIVE
Influenza B by PCR: NEGATIVE
Resp Syncytial Virus by PCR: NEGATIVE
SARS Coronavirus 2 by RT PCR: NEGATIVE

## 2023-09-26 LAB — TROPONIN I (HIGH SENSITIVITY)
Troponin I (High Sensitivity): 3 ng/L (ref <18)
Troponin I (High Sensitivity): 3 ng/L (ref <18)

## 2023-09-26 MED ORDER — AZITHROMYCIN 250 MG PO TABS
ORAL_TABLET | ORAL | 0 refills | Status: AC
Start: 2023-09-26 — End: 2023-10-01

## 2023-09-26 MED ORDER — SODIUM CHLORIDE 0.9 % IV SOLN
1.0000 g | Freq: Once | INTRAVENOUS | Status: AC
  Administered 2023-09-26: 1 g via INTRAVENOUS
  Filled 2023-09-26: qty 10

## 2023-09-26 MED ORDER — SODIUM CHLORIDE 0.9 % IV BOLUS
500.0000 mL | Freq: Once | INTRAVENOUS | Status: AC
  Administered 2023-09-26: 500 mL via INTRAVENOUS

## 2023-09-26 MED ORDER — IOHEXOL 350 MG/ML SOLN
50.0000 mL | Freq: Once | INTRAVENOUS | Status: AC | PRN
  Administered 2023-09-26: 50 mL via INTRAVENOUS

## 2023-09-26 NOTE — Telephone Encounter (Signed)
 Agree with ED dispo  especially given high risk patient

## 2023-09-26 NOTE — ED Triage Notes (Addendum)
 Pt comes with cp started on Monday. Pt states heaviness in chest and then pain in right arm and fever. Pt states some tightness in legs. Pt states hx of dissections.  Pt is on thinners. Pt also have 2 aneurysms the docs have been following .

## 2023-09-26 NOTE — Discharge Instructions (Addendum)
 Please follow up with your primary care doctor. Please return to the emergency department if you develop further fevers, chest pain, shortness of breath or any other new or concerning symptoms.

## 2023-09-26 NOTE — Telephone Encounter (Signed)
 FYI Only or Action Required?: FYI only for provider.  Patient was last seen in primary care on 09/11/2023 by Isabel Greig BRAVO, MD.  Called Nurse Triage reporting right  sided Chest Pain with radiation down right arm. HR is elevated and pt also has fever cough with yellow phlegm. Due to hx advised ED. Had chest heaviness and leg cramping Monday   Symptoms began Monday.  Interventions attempted: Rest, hydration, or home remedies.  Symptoms are: gradually worsening.  Triage Disposition: Go to ED Now (or PCP Triage)  Patient/caregiver understands and will follow disposition?: yes          Copied from CRM #[id number]. Topic: Clinical - Red Word Triage >> Sep 26, 2023  8:06 AM Isabel Foley wrote: [facility] that prompted transfer to Nurse Triage: Patient called in stated she had chest pain that went to her right arm, and had a fever 101 Reason for Disposition  Taking a deep breath makes pain worse  Answer Assessment - Initial Assessment Questions 1. LOCATION: Where does it hurt?       Right   2. RADIATION: Does the pain go anywhere else? (e.g., into neck, jaw, arms, back)     To the bicep area and then down the arm  3. ONSET: When did the chest pain begin? (Minutes, hours or days)      Monday chest heaviness with exertion that went away after walking stopped.  4. PATTERN: Does the pain come and go, or has it been constant since it started?  Does it get worse with exertion?      Constant worse with bending over feels more chest pain  5. DURATION: How long does it last (e.g., seconds, minutes, hours)     N/a 6. SEVERITY: How bad is the pain?  (e.g., Scale 1-10; mild, moderate, or severe)     Varies 7/10  last night 10/10 7. CARDIAC RISK FACTORS: Do you have any history of heart problems or risk factors for heart disease? (e.g., angina, prior heart attack; diabetes, high blood pressure, high cholesterol, smoker, or strong family history of heart disease)     H/o FMD with h/o 2  dissections  8. PULMONARY RISK FACTORS: Do you have any history of lung disease?  (e.g., blood clots in lung, asthma, emphysema, birth control pills)     Asthma  9. CAUSE: What do you think is causing the chest pain?    Has h/o pleurisy  10. OTHER SYMPTOMS: Do you have any other symptoms? (e.g., dizziness, nausea, vomiting, sweating, fever, difficulty breathing, cough)       Last night 101   this am : BP 109/73 HR 112 - cough- yellow, leg cramping,  Chest with heaviness used inhaler last night  Protocols used: Chest Pain-A-AH

## 2023-09-26 NOTE — ED Provider Notes (Signed)
 [facility] Provider Note    Event Date/Time   First MD Initiated Contact with Patient 09/26/23 1259     (approximate)   History   Chest Pain   HPI  Isabel Foley is a 53 y.o. 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  Patient states that she had an episode of chest pain last night that was severe to her right side.  States that her husband told her she had to come in today to be evaluated.  Does state that she has had a history of dissection in the past endorses some mild chest discomfort at this time that is substernal.  Denies any tearing chest pain.  Endorses some mild cough and shortness of breath over the past couple of days.  Endorses a low-grade fever at home.  Denies any history of a blood clot.  Does endorse that use of Eliquis and states that she has not missed any doses.  Does have a family history of coronary artery disease.  Recent coronary CT scan that was overall normal.     Physical Exam   Triage Vital Signs: ED Triage Vitals  Encounter Vitals Group     BP 09/26/23 1232 119/82     Girls Systolic BP Percentile --      Girls Diastolic BP Percentile --      Boys Systolic BP Percentile --      Boys Diastolic BP Percentile --      Pulse Rate 09/26/23 1232 91     Resp 09/26/23 1232 18     Temp 09/26/23 1232 98 F (36.7 C)     Temp src --      SpO2 09/26/23 1232 100 %     Weight 09/26/23 1227 117 lb (53.1 kg)     Height 09/26/23 1227 5' 1 (1.549 m)     Head Circumference --      Peak Flow --      Pain Score 09/26/23 1227 4     Pain Loc --      Pain Education --      Exclude from Growth Chart --     Most recent vital signs: Vitals:   09/26/23 1336 09/26/23  1500  BP:  122/88  Pulse:  87  Resp:  16  Temp:    SpO2: 100% 100%    Physical Exam Constitutional:      Appearance: She is well-developed.  HENT:     Head: Atraumatic.  Eyes:     Conjunctiva/sclera: Conjunctivae normal.  Cardiovascular:     Rate and Rhythm: Regular rhythm.     Pulses:          Radial pulses are 2+ on the right side and 2+ on the left side.       Dorsalis pedis pulses are 2+ on the right side and 2+ on the left side.  Pulmonary:     Effort: No respiratory distress.  Abdominal:     General: There is no distension.     Palpations: Abdomen is soft.     Tenderness: There is no abdominal tenderness.  Musculoskeletal:        General: Normal range of motion.     Cervical back: Normal range of motion.     Right  lower leg: No edema.     Left lower leg: No edema.  Skin:    General: Skin is warm.     Capillary Refill: Capillary refill takes less than 2 seconds.  Neurological:     General: No focal deficit present.     Mental Status: She is alert. Mental status is at baseline.     IMPRESSION / MDM / ASSESSMENT AND PLAN / ED COURSE  I reviewed the triage vital signs and the nursing notes.  Differential diagnosis including dissection, pneumonia, viral illness including COVID/influenza, anemia, ACS, pulmonary embolism, dehydration, electrolyte abnormality, pericarditis, SCAD  EKG  I, Clotilda Punter, the attending physician, personally viewed and interpreted this ECG.  EKG showed sinus tachycardia with a heart rate of 101.  Normal intervals.  No chamber enlargement.  No significant ST elevation or depression.  No findings of acute ischemia or dysrhythmia.  No tachycardic or bradycardic dysrhythmias while on cardiac telemetry.  RADIOLOGY I independently reviewed imaging, my interpretation of imaging: Chest x-ray with no acute findings.  LABS (all labs ordered are listed, but only abnormal results are displayed) Labs interpreted as -    Labs Reviewed  BASIC  METABOLIC PANEL WITH GFR - Abnormal; Notable for the following components:      Result Value   Glucose, Bld 124 (*)    All other components within normal limits  CBC - Abnormal; Notable for the following components:   WBC 16.5 (*)    All other components within normal limits  RESP PANEL BY RT-PCR (RSV, FLU A&B, COVID)  RVPGX2  TROPONIN I (HIGH SENSITIVITY)  TROPONIN I (HIGH SENSITIVITY)     MDM  Patient has a leukocytosis of 16.5 which is new.  No recent steroid use.  Anemia but hemoglobin appears to be stable at 12.5 and normal platelets.  Creatinine is at her baseline with no significant electrolyte abnormality.  COVID and influenza testing are negative.  Initial troponin is negative  Given 500 bolus of IV fluids  CTA to further evaluate for dissection, pneumonia or pulmonary embolism.  Very low suspicion for scad given that the patient has normal heart enzyme in the emergency department.  If CTA is negative and no other findings of pneumonia plan will be to discharge home with outpatient follow-up.     PROCEDURES:  Critical Care performed: No  Procedures  Patient's presentation is most consistent with acute presentation with potential threat to life or bodily function.   MEDICATIONS ORDERED IN ED: Medications  sodium chloride  0.9 % bolus 500 mL (500 mLs Intravenous New Bag/Given 09/26/23 1520)  iohexol  (OMNIPAQUE ) 350 MG/ML injection 50 mL (50 mLs Intravenous Contrast Given 09/26/23 1533)    FINAL CLINICAL IMPRESSION(S) / ED DIAGNOSES   Final diagnoses:  Chest pain, unspecified type     Rx / DC Orders   ED Discharge Orders     None        Note:  This document was prepared using Dragon voice recognition software and may include unintentional dictation errors.   Punter Clotilda, MD 09/26/23 1544

## 2023-09-26 NOTE — ED Provider Notes (Signed)
 CT: IMPRESSION:  1. No evidence for aortic dissection or aneurysm.  2. Small amount of focal airspace disease in the medial right middle  lobe with surrounding ground-glass opacities. Findings are worrisome  for pneumonia. Follow-up imaging recommended in 4-6 weeks to confirm  resolution.    Discussed findings with the patient.  At this time I do think pneumonia would explain the patient's symptoms.  Does have leukocytosis in the blood work.  I did offer admission of the patient however at this time she feels comfortable with the discharge which I think is reasonable.  Patient appears nontoxic.  Will give dose of IV antibiotics here and plan on discharging with further antibiotics.   Floy Roberts, MD 09/26/23 [phone]

## 2023-09-30 ENCOUNTER — Ambulatory Visit (INDEPENDENT_AMBULATORY_CARE_PROVIDER_SITE_OTHER): Admitting: Psychology

## 2023-09-30 DIAGNOSIS — F4310 Post-traumatic stress disorder, unspecified: Secondary | ICD-10-CM | POA: Diagnosis not present

## 2023-09-30 DIAGNOSIS — F411 Generalized anxiety disorder: Secondary | ICD-10-CM

## 2023-09-30 DIAGNOSIS — F902 Attention-deficit hyperactivity disorder, combined type: Secondary | ICD-10-CM

## 2023-09-30 NOTE — Progress Notes (Signed)
 [facility] Counselor/Therapist Progress Note  Patient ID: Isabel Foley, MRN: [id number],    Date: 09/30/2023  Time Spent: 11:00am-12:06pm   Pt is seen for a virtual video visit.  Pt joins from her home reporting privacy, and counselor from her home office.  Pt consents to virtual visit and is aware of limitations of such visits.    Treatment Type: Individual Therapy  Reported Symptoms: pt reports recent health stressor, pt reports still struggling w/ anxiety w/ driving significantly.  Mental Status Exam: Appearance:  Well Groomed     Behavior: Appropriate  Motor: Normal  Speech/Language:  Clear and Coherent and Normal Rate  Affect: Appropriate  Mood: anxious  Thought process: normal  Thought content:   WNL  Sensory/Perceptual disturbances:   WNL  Orientation: oriented to person, place, time/date, and situation  Attention: Good  Concentration: Good  Memory: WNL  Fund of knowledge:  Good  Insight:   Good  Judgment:  Good  Impulse Control: Good   Risk Assessment: Danger to Self:  No Self-injurious Behavior: No Danger to Others: No Duty to Warn:no Physical Aggression / Violence:No  Access to Firearms a concern: No  Gang Involvement:No   Subjective: Counselor assessed pt current functioning per pt report.  Processed w/ pt recent health stressors and self care following.  Assisted pt in recognizing areas for increased self care and slowing down.  Explored worries and utilizing mindful and grounding coping skills daily.  Assisted in identifying ways to incorporate a daily practice. Pt affect wnl.  Pt reports she was in ED 4 days ago for chest pain and dx w/ pneumonia.  Pt feels identified that still pushes self to hard and not being mindful of slowing down for her self care w/ medical issues.  Pt discussed busy w/ things for family.  Pt discussed how anxiety not improving w/ driving/riding.  Pt receptive to establishing more consistent practice daily.  Pt also  discussed want to focus more on organization for self.      Interventions: Cognitive Behavioral Therapy, Mindfulness Meditation, and supportive  Diagnosis:GAD (generalized anxiety disorder)  PTSD (post-traumatic stress disorder)  Attention deficit hyperactivity disorder (ADHD), combined type, moderate  Plan: Pt to f/u in 2 weeks w/ counseling.  Pt to f/u as scheduled w/ PCP and specialists. Practice mindfulness and grounding skills daily.    Individualized Treatment Plan Strengths: enjoys travel and time w/ family  Supports: husband, friends/church community   Goal/Needs for Treatment:  In order of importance to patient 1) cope w/ anxiety 2) -- 3) ---   Client Statement of Needs: Coping w/ the anxiety   Treatment Level:outpatient counseling  Symptoms:anxiety/on edge, difficulty w/ worry, mind constantly going.  If slows down fatigue.    Client Treatment Preferences:biweekly counseling.  Continue  w/ PCP and specialists.   Healthcare consumer's goal for treatment:  Counselor, Damien Herald, [facility] will support the patient's ability to achieve the goals identified. Cognitive Behavioral Therapy, Assertive Communication/Conflict Resolution Training, Relaxation Training, ACT, Humanistic and other evidenced-based practices will be used to promote progress towards healthy functioning.   Healthcare consumer will: Actively participate in therapy, working towards healthy functioning.    *Justification for Continuation/Discontinuation of Goal: R=Revised, O=Ongoing, A=Achieved, D=Discontinued  Goal 1) Increase use of relaxation/mindfulness and descalation skills to reduce and cop w/ symptoms of anxiety.   Baseline date 11/25/22: Progress towards goal 0; How Often - Daily Target Date Goal Was reviewed Status Code Progress towards goal/Likert rating  11/25/23  Goal 2) Identify, challenge, and replace biased, fearful self-talk with positive, realistic, and empowering  self-talk. Baseline date 11/25/22: Progress towards goal 0; How Often - Daily Target Date Goal Was reviewed Status Code Progress towards goal  11/25/23                This plan has been reviewed and created by the following participants:  This plan will be reviewed at least every 12 months. Date Behavioral Health Clinician Date Guardian/Patient   11/25/22  [facility] Isabel [facility] 11/25/22 Verbal Consent Provided                       Isabel Foley LCMHC

## 2023-10-01 LAB — CULTURE, BLOOD (ROUTINE X 2)
Culture: NO GROWTH
Culture: NO GROWTH
Special Requests: ADEQUATE
Special Requests: ADEQUATE

## 2023-10-03 ENCOUNTER — Encounter: Payer: Self-pay | Admitting: Family Medicine

## 2023-10-03 ENCOUNTER — Ambulatory Visit (INDEPENDENT_AMBULATORY_CARE_PROVIDER_SITE_OTHER): Admitting: Family Medicine

## 2023-10-03 VITALS — BP 104/82 | HR 124 | Ht 61.0 in | Wt 120.0 lb

## 2023-10-03 DIAGNOSIS — Q796 Ehlers-Danlos syndrome, unspecified: Secondary | ICD-10-CM

## 2023-10-03 DIAGNOSIS — M545 Low back pain, unspecified: Secondary | ICD-10-CM | POA: Diagnosis not present

## 2023-10-03 DIAGNOSIS — I773 Arterial fibromuscular dysplasia: Secondary | ICD-10-CM | POA: Diagnosis not present

## 2023-10-03 DIAGNOSIS — M542 Cervicalgia: Secondary | ICD-10-CM | POA: Diagnosis not present

## 2023-10-03 DIAGNOSIS — M357 Hypermobility syndrome: Secondary | ICD-10-CM | POA: Diagnosis not present

## 2023-10-03 DIAGNOSIS — G8929 Other chronic pain: Secondary | ICD-10-CM

## 2023-10-03 NOTE — Progress Notes (Signed)
 I, Isabel Foley, CMA acting as a scribe for Isabel Lloyd, MD.  Isabel Foley is a 53 y.o. 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   Dominant symptoms today as relates to MSK pain is neck pain and dysfunction and some radicular pain down both arms.  Additionally she has chronic low back pain with some pain radiating down her legs.  She denies significant weakness.  This is a chronic ongoing issue for years now.  She has had extensive imaging including CT scan of the head neck and lumbar spine that did show some degenerative changes but has not had MRI or CT myelogram.  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   Exam:  BP 104/82   Pulse (!) 124   Ht 5' 1 (1.549 m)   Wt 120 lb (54.4 kg)   SpO2 98%   BMI 22.67 kg/m  General: Well Developed, well nourished, and in no acute distress.   MSK: Hypermobility evaluation Beighton hypermobility score is positive with a score of 9/9.  Ehlers-Danlos evaluation is positive with a score of 5.  Patient also has recurrent  musculoskeletal pain and widespread pain.   Lab and Radiology Results No results found for this or any previous visit (from the past 72 hours). No results found.     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  She has had genetic testing in [place] more specifically for fibromuscular dysplasia which was negative.  It seems that she has not had genetic testing for other connective tissue diseases.  Although I am not certain.  We are obtaining medical records from [place] to try to get a copy of her genetic tests to see what was assessed and what was not assessed.  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  She does meet criteria today for Ehlers-Danlos but I am uncertain of the type.  We are obtaining medical records from her previous genetic test and likely will be obtaining genetic test ourselves in the near future to assess for Ehlers-Danlos syndrome and other connective tissue diseases including Marfan's or Loeys-Dietz Syndrome (Probably the [employer] Connective Tissue Disorders Panel Test code: [phone])  As for treatment we will go ahead and start physical therapy with physical therapist well-versed  in hypermobility conditions.  Of note she should avoid having chiropractor type or DO type manipulations of her cervical spine to reduce risk of vertebral artery dissection.  In my professional medical opinion it is reasonable for her to have basic physical therapy treatments as this could significantly improve her pain.   Additionally will proceed with MRI C-spine and L-spine.  She does have some indication for lumbar and cervical radiculopathy.  She has had extensive imaging already but not imaging of the actual spinal cord or nerve roots.  Recheck in 1 month   PDMP not reviewed this encounter. Orders Placed This Encounter  Procedures    MR CERVICAL SPINE WO CONTRAST    Standing Status:   Future    Expiration Date:   11/02/2023    What is the patient's sedation requirement?:   No Sedation    Does the patient have a pacemaker or implanted devices?:   No    Preferred imaging location?:   [facility]   MR Lumbar Spine Wo Contrast    Standing Status:   Future    Expiration Date:   10/02/2024    What is the patient's sedation requirement?:   No Sedation    Does the patient have a pacemaker or implanted devices?:   No    Preferred imaging location?:   [facility]   Ambulatory referral to Physical Therapy    Referral Priority:   Routine    Referral Type:   Physical Medicine    Referral Reason:   Specialty Services Required    Requested Specialty:   Physical Therapy    Number of Visits Requested:   1   No orders of the defined types were placed in this encounter.    Discussed warning signs or symptoms. Please see discharge instructions. Patient expresses understanding.   The above documentation has been reviewed and is accurate and complete Isabel Foley, M.D.

## 2023-10-03 NOTE — Patient Instructions (Addendum)
 Thank you for coming in today.   I've referred you to Physical Therapy.  Let us  know if you don't hear from them in one week.   You should hear from MRI scheduling within 1 week. If you do not hear please let me know.    Check out the book Disjointed Navigating the Diagnosis and Management of Hypermobile Ehlers-Danlos Syndrome and Hypermobility Spectrum Disorders.   I will talk to your team  Check back in 1 month

## 2023-10-06 ENCOUNTER — Telehealth: Payer: Self-pay | Admitting: Family Medicine

## 2023-10-06 NOTE — Telephone Encounter (Signed)
 Patient called and stated the MRI referral was made to [facility] but was supposed to be sent to [facility]. They let her know the work order needs to be changed. Please advise.

## 2023-10-06 NOTE — Telephone Encounter (Signed)
 Order updated for MRI C-Spine and MRI L-spine with [facility] to [facility].   Forwarding to [place] as FYI.

## 2023-10-06 NOTE — Telephone Encounter (Signed)
 Location was changed to [facility] location but I cannot change it through insurance currently because it is in the middle of review

## 2023-10-06 NOTE — Addendum Note (Signed)
 Addended by: MARDY LEOTIS RAMAN on: 10/06/2023 03:02 PM   Modules accepted: Orders

## 2023-10-07 ENCOUNTER — Ambulatory Visit: Payer: Self-pay | Admitting: Family Medicine

## 2023-10-07 ENCOUNTER — Ambulatory Visit (INDEPENDENT_AMBULATORY_CARE_PROVIDER_SITE_OTHER): Admitting: Family Medicine

## 2023-10-07 ENCOUNTER — Encounter: Payer: Self-pay | Admitting: Family Medicine

## 2023-10-07 VITALS — BP 100/80 | HR 79 | Temp 99.8°F | Ht 61.0 in | Wt 120.0 lb

## 2023-10-07 DIAGNOSIS — J189 Pneumonia, unspecified organism: Secondary | ICD-10-CM | POA: Insufficient documentation

## 2023-10-07 DIAGNOSIS — J019 Acute sinusitis, unspecified: Secondary | ICD-10-CM | POA: Diagnosis not present

## 2023-10-07 DIAGNOSIS — D72829 Elevated white blood cell count, unspecified: Secondary | ICD-10-CM | POA: Diagnosis not present

## 2023-10-07 DIAGNOSIS — R918 Other nonspecific abnormal finding of lung field: Secondary | ICD-10-CM | POA: Diagnosis not present

## 2023-10-07 LAB — CBC WITH DIFFERENTIAL/PLATELET
Basophils Absolute: 0.1 K/uL (ref 0.0–0.1)
Basophils Relative: 0.9 % (ref 0.0–3.0)
Eosinophils Absolute: 0.1 K/uL (ref 0.0–0.7)
Eosinophils Relative: 0.7 % (ref 0.0–5.0)
HCT: 39.9 % (ref 36.0–46.0)
Hemoglobin: 13.3 g/dL (ref 12.0–15.0)
Lymphocytes Relative: 18.8 % (ref 12.0–46.0)
Lymphs Abs: 1.7 K/uL (ref 0.7–4.0)
MCHC: 33.5 g/dL (ref 30.0–36.0)
MCV: 94 fl (ref 78.0–100.0)
Monocytes Absolute: 0.8 K/uL (ref 0.1–1.0)
Monocytes Relative: 9.4 % (ref 3.0–12.0)
Neutro Abs: 6.3 K/uL (ref 1.4–7.7)
Neutrophils Relative %: 70.2 % (ref 43.0–77.0)
Platelets: 349 K/uL (ref 150.0–400.0)
RBC: 4.24 Mil/uL (ref 3.87–5.11)
RDW: 13 % (ref 11.5–15.5)
WBC: 9 K/uL (ref 4.0–10.5)

## 2023-10-07 MED ORDER — AMOXICILLIN 500 MG PO CAPS: 1000.0000 mg | ORAL_CAPSULE | Freq: Two times a day (BID) | ORAL | 0 refills | Status: DC

## 2023-10-07 NOTE — Assessment & Plan Note (Signed)
 Acute, difficult to assess whether symptoms are due to allergies versus continued bacterial infection. Patient will increase Flonase to 2 sprays per nostril daily while continuing her routine allergy treatment including Zyrtec, Singulair and ipratropium nasal spray. Provided prescription for amoxicillin  500 mg 2 tablets twice daily for 10 days to use if symptoms progress. She also has a course of prednisone  at home for 5 days she could use to alleviate eustachian tube dysfunction and facial pressure.  Return and ER precautions provided.

## 2023-10-07 NOTE — Progress Notes (Signed)
 Patient ID: Isabel Foley, female    DOB: 13-Mar-1970, 53 y.o.   MRN: [id number]  This visit was conducted in person.  BP 100/80   Pulse 79   Temp 99.8 F (37.7 C) (Temporal)   Ht 5' 1 (1.549 m)   Wt 120 lb (54.4 kg)   SpO2 94%   BMI 22.67 kg/m    CC:  Chief Complaint  Patient presents with   Hospitalization Follow-up    ER follow up 09/26/23    Subjective:   HPI: Isabel Foley is a 53 y.o. 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 Fever 101 Presented with chest pain severe on the right   Associated with mild cough and shortness of breath EKG showed sinus tachycardia, heart rate 101 no significant ST elevation or depression. Chest x-ray with no acute findings CBC showed leukocytosis Respiratory viral panel negative Troponins negative x 2 Chest CT angio:  IMPRESSION: 1. No evidence for aortic dissection or aneurysm. 2. Small amount of focal airspace disease in the medial right middle lobe with surrounding ground-glass opacities. Findings are worrisome for pneumonia. Follow-up imaging recommended in 4-6 weeks to confirm resolution.   In the ED she received 1 dose of Rocephin  and was discharged home on Z-Pak.  Today she reports   continued head congestion ( clear mucus) and sneezing. Has headache though. Ears popping.  Using zyrtec, Singulair and flonase 1 sprays per nostril, ipratropium 1 per nostril daily.  No further fever, no additional chest pain.  No SOB, no wheeze.    Relevant past medical, surgical, family and social history reviewed and updated as indicated. Interim medical history since our last visit reviewed. Allergies and medications reviewed and  updated. Outpatient Medications Prior to Visit  Medication Sig Dispense Refill   acetaminophen  (TYLENOL ) 500 MG tablet Take 1,000 mg by mouth every 6 (six) hours as needed for moderate pain.     ALPRAZolam  (XANAX ) 0.25 MG tablet Take 1-2 tablets (0.25-0.5 mg total) by mouth daily as needed (BP > 140/90). 30 tablet 0   ALPRAZolam  (XANAX ) 0.5 MG tablet CAN USE 1 TAB DAILY AS NEEDED FOR ANXIETY ON LONG TRIPS. 20 tablet 0   apixaban (ELIQUIS) 5 MG TABS tablet Take 5 mg by mouth 2 (two) times daily.     aspirin  81 MG chewable tablet Chew 81 mg by mouth daily.     atomoxetine  (STRATTERA ) 40 MG capsule TAKE 1 CAPSULE (40 MG TOTAL) BY MOUTH DAILY. 90 capsule 0   azelastine (OPTIVAR) 0.05 % ophthalmic solution Place 1 drop into both eyes 2 (two) times daily as needed (allergies).     cetirizine (ZYRTEC) 10 MG tablet Take 10 mg by mouth daily.     Cholecalciferol (DIALYVITE VITAMIN D  5000) 125 MCG (5000 UT) capsule Take 5,000 Units by mouth daily.     COLLAGEN PO Take 1 Scoop by mouth daily.     cyanocobalamin  (VITAMIN B12) 1000 MCG tablet Take 1,000 mcg by mouth.     cyclobenzaprine  (FLEXERIL ) 5 MG tablet Take 5 mg by mouth 3 (three) times daily as needed for muscle spasms.     diphenhydrAMINE (BENADRYL) 25 MG tablet Take 25 mg by mouth at bedtime as needed for itching.     fexofenadine (  ALLEGRA) 180 MG tablet Take 180 mg by mouth daily as needed for allergies.  5   fluticasone  (FLONASE) 50 MCG/ACT nasal spray Place 2 sprays into both nostrils daily as needed for allergies.     hydrocortisone cream 1 % Apply 1 Application topically 2 (two) times daily as needed for itching.     ipratropium (ATROVENT) 0.06 % nasal spray Place into both nostrils.     levalbuterol (XOPENEX HFA) 45 MCG/ACT inhaler Inhale 1 puff into the lungs every 4 (four) hours as needed for wheezing.     levothyroxine (SYNTHROID) 25 MCG tablet Take 25 mcg by mouth every morning.     metroNIDAZOLE  (METROCREAM ) 0.75 % cream Apply topically  2 (two) times daily.     mometasone  (ELOCON ) 0.1 % cream APPLY TO AFFECTED AREAS RASH ON BODY ONCE TO TWICE DAILY AS NEEDED. AVOID APPLYING TO FACE, GROIN, AND AXILLA. USE AS DIRECTED. LONG-TERM USE CAN CAUSE THINNING OF THE SKIN. 45 g 1   montelukast (SINGULAIR) 10 MG tablet Take 10 mg by mouth at bedtime.     Multiple Vitamin (MULTIVITAMIN) tablet Take 1 tablet by mouth daily.     Multiple Vitamins-Minerals (HAIR SKIN & NAILS PO) Take by mouth.     OVER THE COUNTER MEDICATION Plexus pre & probiotic     PREBIOTIC PRODUCT PO Take 1 capsule by mouth daily.     predniSONE  (DELTASONE ) 20 MG tablet Take 1 tablet (20 mg total) by mouth daily with breakfast. 7 tablet 0   Probiotic Product (PROBIOTIC FORMULA PO) Take 1 tablet by mouth daily.     Ubrogepant (UBRELVY) 100 MG TABS Take 1 tablet by mouth daily as needed (For Migraines). Can repeat dose after 72 hours     metroNIDAZOLE  (METROCREAM ) 0.75 % cream APPLY TO AFFECTED AREAS FACE ONCE TO TWICE DAILY FOR ROSACEA. 45 g 3   predniSONE  (DELTASONE ) 20 MG tablet Take by mouth.     No facility-administered medications prior to visit.     Per HPI unless specifically indicated in ROS section below Review of Systems  Constitutional:  Negative for fatigue and fever.  HENT:  Positive for congestion, postnasal drip, rhinorrhea, sinus pressure and sneezing. Negative for ear discharge, ear pain, sinus pain and sore throat.   Eyes:  Positive for itching. Negative for pain.  Respiratory:  Negative for cough and shortness of breath.   Cardiovascular:  Negative for chest pain, palpitations and leg swelling.  Gastrointestinal:  Negative for abdominal pain.  Genitourinary:  Negative for dysuria and vaginal bleeding.  Musculoskeletal:  Negative for back pain.  Neurological:  Positive for headaches. Negative for syncope and light-headedness.  Psychiatric/Behavioral:  Negative for dysphoric mood.    Objective:  BP 100/80   Pulse 79   Temp 99.8 F (37.7 C)  (Temporal)   Ht 5' 1 (1.549 m)   Wt 120 lb (54.4 kg)   SpO2 94%   BMI 22.67 kg/m   Wt Readings from Last 3 Encounters:  10/07/23 120 lb (54.4 kg)  10/03/23 120 lb (54.4 kg)  09/26/23 117 lb (53.1 kg)      Physical Exam Constitutional:      General: She is not in acute distress.    Appearance: Normal appearance. She is well-developed. She is not ill-appearing or toxic-appearing.  HENT:     Head: Normocephalic.     Right Ear: Hearing, ear canal and external ear normal. A middle ear effusion is present. Tympanic membrane is not erythematous, retracted or bulging.  Left Ear: Hearing, ear canal and external ear normal.  No middle ear effusion. Tympanic membrane is scarred. Tympanic membrane is not erythematous, retracted or bulging.     Nose: No mucosal edema or rhinorrhea.     Right Sinus: No maxillary sinus tenderness or frontal sinus tenderness.     Left Sinus: No maxillary sinus tenderness or frontal sinus tenderness.     Mouth/Throat:     Pharynx: Uvula midline.  Eyes:     General: Lids are normal. Lids are everted, no foreign bodies appreciated.     Conjunctiva/sclera: Conjunctivae normal.     Pupils: Pupils are equal, round, and reactive to light.  Neck:     Thyroid : No thyroid  mass or thyromegaly.     Vascular: No carotid bruit.     Trachea: Trachea normal.  Cardiovascular:     Rate and Rhythm: Normal rate and regular rhythm.     Pulses: Normal pulses.     Heart sounds: Normal heart sounds, S1 normal and S2 normal. No murmur heard.    No friction rub. No gallop.  Pulmonary:     Effort: Pulmonary effort is normal. No tachypnea or respiratory distress.     Breath sounds: Normal breath sounds. No decreased breath sounds, wheezing, rhonchi or rales.  Abdominal:     General: Bowel sounds are normal.     Palpations: Abdomen is soft.     Tenderness: There is no abdominal tenderness.  Musculoskeletal:     Cervical back: Normal range of motion and neck supple.  Skin:     General: Skin is warm and dry.     Findings: No rash.  Neurological:     Mental Status: She is alert.  Psychiatric:        Mood and Affect: Mood is not anxious or depressed.        Speech: Speech normal.        Behavior: Behavior normal. Behavior is cooperative.        Thought Content: Thought content normal.        Judgment: Judgment normal.       Results for orders placed or performed during the hospital encounter of 09/26/23  Basic metabolic panel   Collection Time: 09/26/23 12:28 PM  Result Value Ref Range   Sodium 138 135 - 145 mmol/L   Potassium 3.8 3.5 - 5.1 mmol/L   Chloride 105 98 - 111 mmol/L   CO2 24 22 - 32 mmol/L   Glucose, Bld 124 (H) 70 - 99 mg/dL   BUN 16 6 - 20 mg/dL   Creatinine, Ser 9.19 0.44 - 1.00 mg/dL   Calcium 9.1 8.9 - 89.6 mg/dL   GFR, Estimated >39 >39 mL/min   Anion gap 9 5 - 15  CBC   Collection Time: 09/26/23 12:28 PM  Result Value Ref Range   WBC 16.5 (H) 4.0 - 10.5 K/uL   RBC 3.92 3.87 - 5.11 MIL/uL   Hemoglobin 12.5 12.0 - 15.0 g/dL   HCT 62.4 63.9 - 53.9 %   MCV 95.7 80.0 - 100.0 fL   MCH 31.9 26.0 - 34.0 pg   MCHC 33.3 30.0 - 36.0 g/dL   RDW 86.7 88.4 - 84.4 %   Platelets 247 150 - 400 K/uL   nRBC 0.0 0.0 - 0.2 %  Troponin I (High Sensitivity)   Collection Time: 09/26/23 12:28 PM  Result Value Ref Range   Troponin I (High Sensitivity) 3 <18 ng/L  Resp panel by RT-PCR (RSV, Flu  A&B, Covid) Anterior Nasal Swab   Collection Time: 09/26/23  1:57 PM   Specimen: Anterior Nasal Swab  Result Value Ref Range   SARS Coronavirus 2 by RT PCR NEGATIVE NEGATIVE   Influenza A by PCR NEGATIVE NEGATIVE   Influenza B by PCR NEGATIVE NEGATIVE   Resp Syncytial Virus by PCR NEGATIVE NEGATIVE  Troponin I (High Sensitivity)   Collection Time: 09/26/23  2:53 PM  Result Value Ref Range   Troponin I (High Sensitivity) 3 <18 ng/L  Blood culture (routine x 2)   Collection Time: 09/26/23  5:25 PM   Specimen: BLOOD  Result Value Ref Range   Specimen  Description BLOOD RIGHT ANTECUBITAL    Special Requests      BOTTLES DRAWN AEROBIC AND ANAEROBIC Blood Culture adequate volume   Culture      NO GROWTH 5 DAYS Performed at [facility], [address], [place], [place] [address]    Report Status 10/01/2023 FINAL   Blood culture (routine x 2)   Collection Time: 09/26/23  5:25 PM   Specimen: BLOOD  Result Value Ref Range   Specimen Description BLOOD LEFT ANTECUBITAL    Special Requests      BOTTLES DRAWN AEROBIC AND ANAEROBIC Blood Culture adequate volume   Culture      NO GROWTH 5 DAYS Performed at [facility], [address] [place], [place] [address]    Report Status 10/01/2023 FINAL     Assessment and Plan  Pneumonia of right middle lobe due to infectious organism Assessment & Plan: Acute, most likely cause of recent fever, leukocytosis and chest pain. Will reevaluate with CBC. Given fever and chest pain, shortness of breath resolved, likely resolved infection status post Rocephin  injection and azithromycin  course.  Given chest x-ray clear but CT scan showed pneumonia, recommended repeat CT chest in 4 to 6 weeks to make sure changes resolved.  Orders: -     CT CHEST WO CONTRAST; Future  Abnormal CT scan, lung -     CT CHEST WO CONTRAST; Future  Leukocytosis, unspecified type -     CBC with Differential/Platelet  Acute non-recurrent sinusitis, unspecified location Assessment & Plan: Acute, difficult to assess whether symptoms are due to allergies versus continued bacterial infection. Patient will increase Flonase to 2 sprays per nostril daily while continuing her routine allergy treatment including Zyrtec, Singulair and ipratropium nasal spray. Provided prescription for amoxicillin  500 mg 2 tablets twice daily for 10 days to use if symptoms progress. She also has a course of prednisone  at home for 5 days she could use to alleviate eustachian tube dysfunction and facial pressure.  Return and ER  precautions provided.   Other orders -     Amoxicillin ; Take 2 capsules (1,000 mg total) by mouth 2 (two) times daily.  Dispense: 40 capsule; Refill: 0    No follow-ups on file.   Greig Ring, MD

## 2023-10-07 NOTE — Assessment & Plan Note (Signed)
 Acute, most likely cause of recent fever, leukocytosis and chest pain. Will reevaluate with CBC. Given fever and chest pain, shortness of breath resolved, likely resolved infection status post Rocephin  injection and azithromycin  course.  Given chest x-ray clear but CT scan showed pneumonia, recommended repeat CT chest in 4 to 6 weeks to make sure changes resolved.

## 2023-10-10 NOTE — Telephone Encounter (Signed)
 Patient called to check in because she had not heard back about her MRI being switched. I advised that she is welcome to call [facility] to schedule. She also asked about PT because she has not heard from them. I advised that she is welcome to call them to schedule as well. She did ask if she is to see a specific PT and that was not within her referral. Please advise on that.

## 2023-10-13 ENCOUNTER — Ambulatory Visit (INDEPENDENT_AMBULATORY_CARE_PROVIDER_SITE_OTHER): Admitting: Psychology

## 2023-10-13 DIAGNOSIS — F902 Attention-deficit hyperactivity disorder, combined type: Secondary | ICD-10-CM | POA: Diagnosis not present

## 2023-10-13 DIAGNOSIS — F4310 Post-traumatic stress disorder, unspecified: Secondary | ICD-10-CM | POA: Diagnosis not present

## 2023-10-13 DIAGNOSIS — F411 Generalized anxiety disorder: Secondary | ICD-10-CM | POA: Diagnosis not present
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
## 2023-10-13 NOTE — Telephone Encounter (Signed)
 MRI's have been scheduled for 10/23/23.   For Physical Therapy, this should be with Delon Campus at [facility].

## 2023-10-15 ENCOUNTER — Telehealth: Payer: Self-pay | Admitting: Family Medicine

## 2023-10-15 NOTE — Telephone Encounter (Addendum)
 Called and spoke with patient regarding who to see for PT per last note and she asked if there is somewhere closer that she can go for PT that has someone that is similar to [facility]. Please advise.

## 2023-10-15 NOTE — Telephone Encounter (Signed)
 Here is the list of providers that we've put together from recommendations from patients and other providers:  PT [facility] [address] [place], [place]  PT [facility] [address] [place], [place] [address]  PT [facility] [address] [place], [place] [address]  PT  Leita Mas / [facility] [address] [place], [place] [address]  PT [facility] [address], [place] [address]  PT Vertell Leventhal / [facility] [address], [place] [address]  [facility] [address], [place] [address]  PT Delon Norma [address] [place] [address]  PT [facility] [place]. [address], [place] [address]    We are only personally familiar with Hallie at [facility] and Delon Norma at [facility].   I hope this helps!

## 2023-10-23 ENCOUNTER — Ambulatory Visit
Admission: RE | Admit: 2023-10-23 | Discharge: 2023-10-23 | Disposition: A | Discharge status: Home / Self Care | Source: Ambulatory Visit | Attending: Family Medicine | Admitting: Family Medicine

## 2023-10-23 DIAGNOSIS — M545 Low back pain, unspecified: Secondary | ICD-10-CM | POA: Insufficient documentation

## 2023-10-23 DIAGNOSIS — M542 Cervicalgia: Secondary | ICD-10-CM | POA: Insufficient documentation

## 2023-10-23 DIAGNOSIS — G8929 Other chronic pain: Secondary | ICD-10-CM | POA: Insufficient documentation

## 2023-10-27 ENCOUNTER — Ambulatory Visit: Payer: Self-pay | Admitting: Family Medicine

## 2023-10-27 NOTE — Progress Notes (Signed)
 Lumbar spine MRI shows some arthritis and bulging disc but no definitely pinched nerves.  Again we will talk about this result in more detail during your follow-up appointment on the 27th.

## 2023-10-27 NOTE — Progress Notes (Signed)
 Cervical spine MRI shows some arthritis with some narrowing where the nerves need to leave from the spine but no definitely pinched nerves.  We will talk about this MRI result in more detail during your follow-up appointment on the 27th.

## 2023-10-29 ENCOUNTER — Ambulatory Visit (INDEPENDENT_AMBULATORY_CARE_PROVIDER_SITE_OTHER): Admitting: Psychology

## 2023-10-29 DIAGNOSIS — F902 Attention-deficit hyperactivity disorder, combined type: Secondary | ICD-10-CM

## 2023-10-29 DIAGNOSIS — F411 Generalized anxiety disorder: Secondary | ICD-10-CM

## 2023-10-29 DIAGNOSIS — F4310 Post-traumatic stress disorder, unspecified: Secondary | ICD-10-CM | POA: Diagnosis not present

## 2023-10-29 NOTE — Progress Notes (Signed)
 [facility] Counselor/Therapist Progress Note  Patient ID: Isabel Foley, MRN: [id number],    Date: 10/29/2023  Time Spent: 1:30pm-2:34pm   Pt is seen for a virtual video visit via caregility.  Pt joins from her home, reporting privacy, and counselor from her home office.  Pt consents to virtual visit and is aware of limitations of such visits.    Treatment Type: Individual Therapy  Reported Symptoms: pt reports feeling stressed w/ concerns for her sons and their mental health.  Mental Status Exam: Appearance:  Well Groomed     Behavior: Appropriate  Motor: Normal  Speech/Language:  Clear and Coherent and Normal Rate  Affect: Appropriate  Mood: anxious and worry  Thought process: normal  Thought content:   WNL  Sensory/Perceptual disturbances:   WNL  Orientation: oriented to person, place, time/date, and situation  Attention: Good  Concentration: Good  Memory: WNL  Fund of knowledge:  Good  Insight:   Good  Judgment:  Good  Impulse Control: Good   Risk Assessment: Danger to Self:  No Self-injurious Behavior: No Danger to Others: No Duty to Warn:no Physical Aggression / Violence:No  Access to Firearms a concern: No  Gang Involvement:No   Subjective: Counselor assessed pt current functioning per pt report.  Processed w/ pt recent stressors and anxiety.  Validated and normalized worries and concerns. Discussed way supporting and how to cope w/ stress to not internalize and negatively impact.  Pt affect wnl.  Pt reports she is worrying about her kids. Pt discussed concerns for 2 son's mental health and wanting to support them and recognizing that can't fix for them. Pt worries for son how lives w/ her ex and patterns she is seeing w/ her son that remind of her ex who is dx w/ bipolar disorder.  Pt discussed boundaries for self and ways to support and support he has.  Pt identified need to keep her own stress managed and utilize her supports.     Interventions: Cognitive Behavioral Therapy, Mindfulness Meditation, and supportive  Diagnosis:GAD (generalized anxiety disorder)  PTSD (post-traumatic stress disorder)  Attention deficit hyperactivity disorder (ADHD), combined type, moderate  Plan: Pt to f/u in 2 weeks w/ counseling.  Pt to f/u as scheduled w/ PCP and specialists. Practice mindfulness and grounding skills daily.    Individualized Treatment Plan Strengths: enjoys travel and time w/ family  Supports: husband, friends/church community   Goal/Needs for Treatment:  In order of importance to patient 1) cope w/ anxiety 2) -- 3) ---   Client Statement of Needs: Coping w/ the anxiety   Treatment Level:outpatient counseling  Symptoms:anxiety/on edge, difficulty w/ worry, mind constantly going.  If slows down fatigue.    Client Treatment Preferences:biweekly counseling.  Continue  w/ PCP and specialists.   Healthcare consumer's goal for treatment:  Counselor, Damien Herald, [facility] will support the patient's ability to achieve the goals identified. Cognitive Behavioral Therapy, Assertive Communication/Conflict Resolution Training, Relaxation Training, ACT, Humanistic and other evidenced-based practices will be used to promote progress towards healthy functioning.   Healthcare consumer will: Actively participate in therapy, working towards healthy functioning.    *Justification for Continuation/Discontinuation of Goal: R=Revised, O=Ongoing, A=Achieved, D=Discontinued  Goal 1) Increase use of relaxation/mindfulness and descalation skills to reduce and cop w/ symptoms of anxiety.   Baseline date 11/25/22: Progress towards goal 0; How Often - Daily Target Date Goal Was reviewed Status Code Progress towards goal/Likert rating  11/25/23  Goal 2) Identify, challenge, and replace biased, fearful self-talk with positive, realistic, and empowering self-talk. Baseline date 11/25/22: Progress towards goal 0; How  Often - Daily Target Date Goal Was reviewed Status Code Progress towards goal  11/25/23                This plan has been reviewed and created by the following participants:  This plan will be reviewed at least every 12 months. Date Behavioral Health Clinician Date Guardian/Patient   11/25/22  [facility] [name] [facility] 11/25/22 Verbal Consent Provided                     [name] APPL LCMHC

## 2023-11-03 ENCOUNTER — Ambulatory Visit: Admitting: Family Medicine

## 2023-11-03 VITALS — BP 104/70 | HR 97 | Ht 61.0 in | Wt 121.6 lb

## 2023-11-03 DIAGNOSIS — M545 Low back pain, unspecified: Secondary | ICD-10-CM

## 2023-11-03 DIAGNOSIS — G8929 Other chronic pain: Secondary | ICD-10-CM

## 2023-11-03 DIAGNOSIS — M542 Cervicalgia: Secondary | ICD-10-CM

## 2023-11-03 DIAGNOSIS — I773 Arterial fibromuscular dysplasia: Secondary | ICD-10-CM | POA: Diagnosis not present

## 2023-11-03 DIAGNOSIS — M357 Hypermobility syndrome: Secondary | ICD-10-CM | POA: Diagnosis not present

## 2023-11-03 NOTE — Patient Instructions (Addendum)
 Thank you for coming in today.   [facility] Connective Tissue Disorders Panel Test code: [id number]  92 genes  [email or web address]  Add Famotidine to allergy meds.   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   Recheck in 1 month.   Let me know if you want us  to order that genetic test panel.

## 2023-11-03 NOTE — Progress Notes (Signed)
   I, Claretha Schimke am a scribe for Dr. Artist Lloyd, MD.  Isabel Foley is a 53 y.o. 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 Pt was last seen by Dr. Lloyd on 10/03/23 and we attempted to obtain medical records from [place]. Also, MRI's were ordered and pt was referred to PT, 1st visit 11/3.  Today, pt reports that she has been having dizzy spells and lower extremity weakness. Going through boot camp for hypertension. Just started metoprolol  this week. Discuss the likelihood of her having POTS. She does have jumps in her heart rate. Discuss MRI. Did Dr. Lloyd talk to her vascular doctor? Genetic testing that was done last year discussion.   Dx testing: 10/23/23 C-spine & L-spine MRI  Pertinent review of systems: No fevers or chills  Relevant historical information: Fibromuscular dysplasia   Exam:  BP 104/70   Pulse 97   Ht 5' 1 (1.549 m)   Wt 121 lb 9.6 oz (55.2 kg)   SpO2 98%   BMI 22.98 kg/m  General: Well Developed, well nourished, and in no acute distress.   MSK: C and L-spine decreased motion.    Lab and Radiology Results No results found for this or any previous visit (from the past 72 hours). No results found.     Assessment and Plan: 53 y.o. 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  Patient is starting physical therapy shortly which I think overall will be helpful.  We could consider fibromyalgia medications if needed.  We also talked about POTS treatment strategies.  Recommend salt solution and water before exercise along with compression stockings.  We need to be a wary of increasing blood pressure with her fibromuscular dysplasia and history of aneurysms.  We spent time talking about genetic testing.  I would recommend the [facility] connective tissue disorder panel.  She will let me know if she would like me to  order it.  Also recommend adding famotidine.  Provided list of resources and handouts.  PDMP not reviewed this encounter. No orders of the defined types were placed in this encounter.  No orders of the defined types were placed in this encounter.    Discussed warning signs or symptoms. Please see discharge instructions. Patient expresses understanding.   Recheck in 1 month The above documentation has been reviewed and is accurate and complete Artist Lloyd, M.D. Total encounter time 30 minutes including face-to-face time with the patient and, reviewing past medical record, and charting on the date of service.

## 2023-11-04 ENCOUNTER — Ambulatory Visit
Admission: RE | Admit: 2023-11-04 | Discharge: 2023-11-04 | Disposition: A | Discharge status: Home / Self Care | Source: Ambulatory Visit | Attending: Family Medicine | Admitting: Family Medicine

## 2023-11-04 DIAGNOSIS — J189 Pneumonia, unspecified organism: Secondary | ICD-10-CM | POA: Diagnosis present

## 2023-11-04 DIAGNOSIS — R918 Other nonspecific abnormal finding of lung field: Secondary | ICD-10-CM | POA: Insufficient documentation

## 2023-11-10 ENCOUNTER — Ambulatory Visit: Admitting: Physical Therapy

## 2023-11-10 ENCOUNTER — Encounter: Payer: Self-pay | Admitting: Physical Therapy

## 2023-11-10 ENCOUNTER — Encounter: Payer: Self-pay | Admitting: Family Medicine

## 2023-11-10 DIAGNOSIS — M542 Cervicalgia: Secondary | ICD-10-CM

## 2023-11-10 DIAGNOSIS — M357 Hypermobility syndrome: Secondary | ICD-10-CM | POA: Diagnosis not present

## 2023-11-10 DIAGNOSIS — M5459 Other low back pain: Secondary | ICD-10-CM | POA: Diagnosis not present

## 2023-11-10 NOTE — Therapy (Unsigned)
 OUTPATIENT PHYSICAL THERAPY HYPERMOBILITY EVAL  Patient Name: Isabel Foley MRN: [id number] DOB:11-Jul-1970, 53 y.o., female Today's Date: 11/11/2023  END OF SESSION:  PT End of Session - 11/10/23 1221     Visit Number 1    Number of Visits 8    Date for Recertification  01/05/24    Authorization Type [employer]it[employer]    PT Start Time 1216    PT Stop Time 1303    PT Time Calculation (min) 47 min    Activity Tolerance Patient tolerated treatment well    Behavior During Therapy WFL for tasks assessed/performed          Past Medical History:  Diagnosis Date   Abdominal pain, right upper quadrant    Allergic rhinitis, cause unspecified    Allergy-induced asthma    Anemia    Aneurysm of right renal artery    Anxiety    Arthritis    Depressive disorder, not elsewhere classified    Dizziness and giddiness    Esophageal reflux    Fibromuscular dysplasia    Gallstones    Headache    Liver lesion    Nontoxic multinodular goiter    Other chronic sinusitis    Other malaise and fatigue    Pain in limb    Peptic ulcer, unspecified site, unspecified as acute or chronic, without mention of hemorrhage, perforation, or obstruction    Premenstrual tension syndromes    Pure hypercholesterolemia    TIA (transient ischemic attack)    Unspecified hypothyroidism    Past Surgical History:  Procedure Laterality Date   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     with 2 stents   BREAST BIOPSY Right 06/01/2015   stereo, path pending   COLONOSCOPY WITH PROPOFOL  N/A 06/21/2022   Procedure: COLONOSCOPY WITH PROPOFOL ;  Surgeon: Unk Corinn Skiff, MD;  Location: [facility] ENDOSCOPY;  Service: Gastroenterology;  Laterality: N/A;   DILATION AND CURETTAGE OF UTERUS  01/08/2000   DILITATION & CURRETTAGE/HYSTROSCOPY WITH NOVASURE ABLATION N/A 12/06/2021   Procedure: DILATATION & CURETTAGE/HYSTEROSCOPY WITH NOVASURE ABLATION;  Surgeon: Estelle Service, MD;  Location: [facility] OR;  Service:  Gynecology;  Laterality: N/A;   ESOPHAGOGASTRODUODENOSCOPY (EGD) WITH PROPOFOL  N/A 06/21/2022   Procedure: ESOPHAGOGASTRODUODENOSCOPY (EGD) WITH PROPOFOL ;  Surgeon: Unk Corinn Skiff, MD;  Location: [facility] ENDOSCOPY;  Service: Gastroenterology;  Laterality: N/A;   LEEP  01/07/1998   Patient Active Problem List   Diagnosis Date Noted   Pneumonia of right middle lobe due to infectious organism 10/07/2023   Leukocytosis 10/07/2023   Ehlers-Danlos syndrome (tentative) 10/03/2023   Hypermobility syndrome 09/11/2023   Bilateral carpal tunnel syndrome 09/11/2023   Flank pain 08/27/2023   Menopausal symptoms 08/27/2023   Attention or concentration deficit 06/17/2023   Fluctuating blood pressure 12/31/2022   Iron  overload 12/17/2022   Adenomatous polyp of ascending colon 06/21/2022   Iron  deficiency anemia due to chronic blood loss 11/20/2021   Generalized postprandial abdominal pain 05/29/2021   Chronic allergic conjunctivitis 02/22/2021   Fibromuscular dysplasia    Aneurysm of right renal artery    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   Aneurysm of splenic artery 02/08/2021   Chronic pain in left shoulder 10/26/2019   PTSD (post-traumatic stress disorder) 06/25/2018   Acute non-recurrent sinusitis 02/07/2017   Chronic pain of left knee 07/09/2016   GAD (generalized anxiety disorder) 06/11/2016   Pruritus 10/20/2015   Chronic back pain 03/21/2015   Decreased hearing of both ears 11/22/2014   Dizziness 10/04/2009   OTHER CHRONIC SINUSITIS  12/12/2008   History of hypothyroidism 09/08/2008   HYPERCHOLESTEROLEMIA 09/08/2008   Depression, major, in remission 09/08/2008   Allergic rhinitis 09/08/2008   GERD 09/08/2008   Peptic ulcer 09/08/2008   Common migraine 09/08/2008    PCP: Avelina Greig BRAVO MD   REFERRING PROVIDER: Dr. Artist Lloyd, MD.  REFERRING DIAG: M54.2 (ICD-10-CM) - Cervicalgia M54.50,G89.29 (ICD-10-CM) - Chronic midline low back pain without sciatica M35.7 (ICD-10-CM)  - Hypermobility syndrome  Rationale for Evaluation and Treatment: Rehabilitation  THERAPY DIAG:  Hypermobility syndrome  Cervicalgia  Other low back pain  ONSET DATE: chronic  SUBJECTIVE:                                                                                                                                                                                           SUBJECTIVE STATEMENT: This patient has a 53 y.o. 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 In particular, she reports dizziness and leg weakness which is intermittent since a month ago.  Most of her pain is located in her neck and left lower back.  Pain started in L side of her neck.  The pain radiated to the Rt. and even in the back of her base of skull.  She had Rt UE pain as well but that has resolved.  POTS diagnosis is in question.  Patient's condition is complicated by she has had genetic testingFMA.  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 but Dr. Lloyd does not have the results.  She is followed closely by cardiology and neurologist.  She denies left-sided leg weakness associated with her back pain but does report lower body weakness with periods of walking.  She feels that her Rt UE is weaker- grip/hand.   Patient reports symptoms and/or instability in the following areas: DID NOT FULLY COMPLETE THIS ON EVAL:   - Cervical Spine: [x] Pain [] Instability [x] Limited ROM [] Paresthesia  - Thoracic Spine: [] Pain [] Instability  - Lumbar Spine: [x] Pain [] Instability [] Frequent locking/giving out  - Shoulder: [] L [] R  [] Subluxation [] Dislocations [] Pain [] Fatigue  - Elbow: [] L [] R  [] Instability [] Hyperextension [] Pain  - Wrist/Hand: [] L [] R  [] Instability [] Fatigue with use [] Pain  - Hip: [x] L [] R  [] Instability [] Pain [] Clicking [] Gait deviations  - Knee: [] L [] R  [] Instability [] Hyperextension [] Pain  - Ankle/Foot: [x] L [x] R  [x] Instability  [] Frequent sprains Pain   Does the patient have a diagnosis of any of the following: Fatigue[x]  Brain fog[x]  Lightheadedness[x]  Allergies[]  GI[]  Neurodiverse[x]  ADHD  Additional Notes:  ______________________________________   Patient-reported Beighton Score (if known): ______9_/9  Clinician-assessed Beighton Score (today): _NT______/9    Ehlers-Danlos evaluation is  positive with a score of 5.  Patient also has recurrent musculoskeletal pain and widespread pain.   PERTINENT HISTORY:  Pneumonia, fibromyalgia, muscular dysplasia, allergies/sinus, asthma, vascular dissections, new HTN, chest pain, anemia, anxiety/depression, TIA, hypermobility and Ehlers-Danlos syndrome.   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    Relevant historical information:   MS: joint hypermobility, mild scoliosis Skin/Immune reactions: hyperextensible skin, poor wound healing Nervous system: fatigue, temperature intolerance, dizziness, clumsiness, pre-syncope, migraine Head/Spine: ADHD, slipping ribs CV: palpitations, dissection of the mesenteric artery, aneurism of the renal and splenic arteries, mild regurgitation, tachycardia GI: pain bloating Genitourinary: heavy menstrual cycle in the past Hands & Feet: turn purple at times, piezogenic papules    DDD, Fibromuscular Dysplasia (followed by Rheumatology Dr. 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      PAIN:  Are you having pain? Yes: NPRS scale: none current, was up to 8/10 Pain location: low back , R or L Pain description: tight  Aggravating factors: sitting,   Relieving factors: changing position Ankle pain and stiffness in AM   Are you having pain? Yes: NPRS scale: none today.  Pain location: neck bilateral  Pain description: stiff,  tension  Aggravating factors: cold, looking slightly up  Relieving factors: MHP, gets massages monthly   PRECAUTIONS: Other: cardiac No lifting more than 5 lbs, HR should stay less than 130 bpm  Minimze stretching, twisting     RED FLAGS: PT with current aneurysms    WEIGHT BEARING RESTRICTIONS: No  FALLS:  Has patient fallen in last 6 months? 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  DIAGNOSTIC FINDINGS:  10/23/23 Lumbar MRI IMPRESSION: 1. Small central disc protrusion at L5-S1 without significant stenosis or impingement. 2. Minimal disc bulging and facet hypertrophy at L4-L5 without stenosis or impingement. 3. Fibroid uterus  10/23/23 Cervical IMPRESSION: 1. C4-C5 degenerative changes with disc bulge and bilateral uncovertebral spurring causing mild right C5 foraminal narrowing. No spinal stenosis. 2. C5-C6 degenerative changes with disc space narrowing and mild diffuse disc bulge with left greater than right uncovertebral spurring. No spinal stenosis or foraminal narrowing. 3. Overall, changes are mildly progressed as compared to previous MRI from 2023.IMPRESSION: 1. Small central disc protrusion at L5-S1 without significant stenosis or impingement. 2. Minimal disc bulging and facet hypertrophy at L4-L5 without stenosis or impingement. 3. Fibroid  uterus   CARDIO/ORTHOSTATICS: Baseline RHR on beta blocker  Standing HR Baseline BP Standing BP   PATIENT SURVEYS:  Modified Oswestry: NT on eval   COGNITION: Overall cognitive status: Within functional limits for tasks assessed     SENSATION: WFL   POSTURE: No Significant postural limitations  PALPATION: Appropriate tenderness in posterior cervicals, tension in upper traps Stiffness along cervical spinal segments, gentle   LUMBAR ROM:  WFL no pain, ext felt  good  AROM eval  Flexion   Extension   Right lateral flexion   Left lateral flexion   Right rotation NT  Left rotation NT    (Blank rows = not tested)  LOWER EXTREMITY ROM:   WNL  Active  Right eval Left eval  Hip flexion  Tighter than RT    Hip extension    Hip abduction    Hip adduction    Hip internal rotation  Tight   Hip external rotation    Knee flexion    Knee extension +10 +10  Ankle dorsiflexion 0-5 deg 0-5 deg   Ankle plantarflexion 80+ 80+  Ankle inversion hyper hyper  Ankle eversion     (Blank rows = not tested)  LOWER EXTREMITY MMT:    MMT Right eval Left eval  Hip flexion 4+ 4+  Hip extension    Hip abduction    Hip adduction    Hip internal rotation    Hip external rotation    Knee flexion 5 5  Knee extension 5 5  Ankle dorsiflexion 5 5  Ankle plantarflexion    Ankle inversion    Ankle eversion     (Blank rows = not tested)  LUMBAR SPECIAL TESTS:  Neg SLR    CERVICAL ROM:  Limited in all planes due to tension, discomfort, appears fearful    Active ROM A/PROM (deg) 11/11/2023  Flexion 40  Extension 25  Right lateral flexion 30  Left lateral flexion 30  Right rotation 50  Left rotation 50   (Blank rows = not tested)  UE ROM: AROM NT on eval   Passive ROM Right 11/11/2023 Left 11/11/2023  Shoulder flexion    Shoulder extension    Shoulder abduction    Shoulder adduction    Shoulder extension    Shoulder internal rotation hyper hyper  Shoulder  external rotation hyper Hyper  Elbow flexion    Elbow extension    Wrist flexion    Wrist extension    Wrist ulnar deviation    Wrist radial deviation    Wrist pronation    Wrist supination     (Blank rows = not tested)  UE MMT:    4-/5 grossly in [facility]    MMT Right 11/11/2023 Left 11/11/2023  Shoulder flexion    Shoulder extension    Shoulder abduction    Shoulder adduction    Shoulder extension    Shoulder internal rotation 5 5  Shoulder external rotation 5 5  Elbow flexion    Elbow extension    Grip strength     (Blank rows = not tested)  CERVICAL SPECIAL TESTS:  NT   FUNCTIONAL TESTS:  SLS WNL Strain in lumbar spine with 90/90 lumbar DNF endurance testing 10 sec    GAIT: Distance walked: 150 Assistive device utilized: None Level of assistance: Complete Independence Comments: no deviations notable   TREATMENT DATE:   [facility] Adult PT Treatment:                                                DATE: 11/10/23  Neuromuscular re-ed: Lesleigh tuck Light rotation cervical spine in supine TrA bracing, light with and without pelvic tilt  Self Care: Walking shorter bouts for safety Pain vs strain Degenerative Disc on MRI Flexion vs Ext based exercises  Posture and neck position  PATIENT EDUCATION:  Education details: see above Posture, alignment, neutral zone and joint protection PNE/Explain pain   Joint inflammation/collagen/ligament laxity, muscular support Stretching vs stabilizing  Person educated: Patient Education method: Explanation, Demonstration, and Handouts Education comprehension: verbalized understanding and needs further education  [email or web address]  The following are key points to remember from this Viewpoint article on physical activity and exercise in patients with  spontaneous coronary artery dissection (SCAD) and fibromuscular dysplasia (FMD):  Patients with SCAD and FMD remain at risk of recurrent vascular dissection in the setting of increased mechanical and shear stress of the arteries. Extreme or unusual exercise has been reported as a trigger for SCAD in up to 29% of patients, although causation has not been proven. Exercise in general is associated with positive effects on vascular function, and as such, limiting exercise among patients with SCAD or FMD due to mechanistic risk should be avoided. With limited available data, moderate aerobic training among SCAD patients is recommended for 30-40 minutes 5-7 days/week. Cardiac rehabilitation should be recommended for all patients. In addition, patients should be counseled to remain active using interval training and low resistance weight training. Activity such as endurance aerobic training, muscle building, and yoga with extreme head and neck positions should be done with caution. Patients should use proper breathing and lifting techniques, avoid Valsalva or straining, and avoid high-intensity exercises in extreme environmental conditions. Patients with carotid or vertebral artery dissections should avoid exercises such as push-ups and sit-ups for 8-12 weeks after the acute dissection after which the recommendations would be similar as for SCAD. There are no data currently to support restricting activities such as riding a roller coaster, sky diving, scuba diving, or sexual intercourse.   HOME EXERCISE PROGRAM:  Access Code: [email or web address] URL: [email or web address] Date: 11/10/2023 Prepared by: Delon Norma  Exercises - Supine Cervical Retraction with Towel  - 1 x daily - 7 x weekly - 2 sets - 10 reps - 5 hold - Seated Cervical Rotation AROM  - 1 x daily - 7 x weekly - 2 sets - 10 reps - Supine Transversus Abdominis Bracing - Hands on Stomach  - 1 x daily - 7 x weekly - 2 sets - 10 reps - 5  hold    ASSESSMENT:  CLINICAL IMPRESSION: Patient is a 53y.o. 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  Physical therapy will proceed gently and limit torso rotations but try to instill confidence in being active.   OBJECTIVE IMPAIRMENTS: cardiopulmonary status limiting activity, decreased activity tolerance, decreased endurance, decreased mobility, difficulty walking, decreased ROM, decreased strength, increased fascial restrictions, impaired flexibility, impaired UE functional use, postural dysfunction, and pain.   ACTIVITY LIMITATIONS: carrying, lifting, bending, sitting, standing, squatting, stairs, reach over head, locomotion level, and caring for others  PARTICIPATION LIMITATIONS: meal prep, cleaning, laundry, interpersonal relationship, driving, shopping, and community activity  PERSONAL FACTORS: 3+ comorbidities: FMD- see above/cardiac, joint hypermobility, widespread pain are also affecting patient's functional outcome.   REHAB POTENTIAL: Good  CLINICAL DECISION MAKING: Unstable/unpredictable  EVALUATION COMPLEXITY: High   GOALS: Goals reviewed with patient? Yes  SHORT TERM GOALS: Target date: 12/08/2023    Patient will be able to show independence for initial HEP to include posture, core and hip strength and stability.   Baseline: Goal status: INITIAL   2.  Patient will modify walking in shorter bouts for safety and pain management.  Baseline:  Goal status: INITIAL  3.  Patient will complete functional baseline testing and goals set Baseline:  Goal status: INITIAL   LONG TERM GOALS: Target date: 01/05/2024    Patient will be independent with final HEP upon discharge from PT and report consistent benefit following exercise completion.    Baseline:  Goal status: INITIAL  2.  Patient will report less neck  pain/stiffness reduced by 25% in the morning hours Baseline:  Goal status: INITIAL  3.  Patient will be able to demonstrate proper posture and lifting techniques related to spine health and reduction of symptoms Baseline:  Goal status: INITIAL  4.  M-ODI score TBA Baseline:  Goal status: INITIAL  5.  Pt will be able to walk for 15 minutes or more without increasing leg weakness, dizziness  Baseline:  Goal status: INITIAL   PLAN:  PT FREQUENCY: 1x/week  PT DURATION: 8 weeks  PLANNED INTERVENTIONS: 97164- PT Re-evaluation, 97750- Physical Performance Testing, 97110-Therapeutic exercises, 97530- Therapeutic activity, [id number]- Neuromuscular re-education, 97535- Self Care, [id number]- Manual therapy, Patient/Family education, Balance training, Taping, Cryotherapy, and Moist heat.  PLAN FOR NEXT SESSION: breathing and avoid Valsalva, consider recumbent bike versus walking on treadmill.  Light stabilization cervical spine and lumbar spine, pelvis.  Okay to do light resistance with higher reps.  See above for exercise guidelines   Kristoph Sattler, PT 11/11/2023, 7:49 AM   Delon Norma, PT 11/11/23 7:54 AM Phone: [phone] Fax: [phone]

## 2023-11-10 NOTE — Telephone Encounter (Signed)
Forwarding to Dr. Corey to review.  

## 2023-11-13 ENCOUNTER — Ambulatory Visit: Admitting: Psychology

## 2023-11-13 DIAGNOSIS — F4310 Post-traumatic stress disorder, unspecified: Secondary | ICD-10-CM | POA: Diagnosis not present

## 2023-11-13 DIAGNOSIS — F411 Generalized anxiety disorder: Secondary | ICD-10-CM

## 2023-11-13 DIAGNOSIS — F902 Attention-deficit hyperactivity disorder, combined type: Secondary | ICD-10-CM | POA: Diagnosis not present

## 2023-11-13 NOTE — Progress Notes (Signed)
 [facility] Counselor/Therapist Progress Note  Patient ID: Isabel Foley, MRN: [id number],    Date: 11/13/2023  Time Spent: 1:30pm-2:34pm   Pt is seen for a virtual video visit via caregility.  Pt joins from her home, reporting privacy, and counselor from her office.  Pt consents to virtual visit and is aware of limitations of such visits.    Treatment Type: Individual Therapy  Reported Symptoms: pt reports significant family stressor, with worry and feeling helpless.    Mental Status Exam: Appearance:  Well Groomed     Behavior: Appropriate  Motor: Normal  Speech/Language:  Clear and Coherent and Normal Rate  Affect: Appropriate  Mood: anxious and worry  Thought process: normal  Thought content:   WNL  Sensory/Perceptual disturbances:   WNL  Orientation: oriented to person, place, time/date, and situation  Attention: Good  Concentration: Good  Memory: WNL  Fund of knowledge:  Good  Insight:   Good  Judgment:  Good  Impulse Control: Good   Risk Assessment: Danger to Self:  No Self-injurious Behavior: No Danger to Others: No Duty to Warn:no Physical Aggression / Violence:No  Access to Firearms a concern: No  Gang Involvement:No   Subjective: Counselor assessed pt current functioning per pt report.  Processed w/ pt recent stressors and related emotions.  Validated and normalized worries and concerns. Discussed way supporting her son and expressing acceptance for support.  Pt affect congruent w/ report of anxiety and stress feeling.  Pt reports that her and husband found out that son thinks he is gay and that he has connected with a 52y/o man.  Pt expressed how beliefs no supportive of his identity and how has strong concerns of this man being predator.  Pt reports concern as he has been buying gifts, saying will give car/phone and can live w/him.  Pt recognizes that w/ son's age limited in placing barriers.  Pt reflected on how siblings are support to him as  well and wants to show support to son and not accidentally push away.      Interventions: Cognitive Behavioral Therapy, Mindfulness Meditation, and supportive  Diagnosis:GAD (generalized anxiety disorder)  PTSD (post-traumatic stress disorder)  Attention deficit hyperactivity disorder (ADHD), combined type, moderate  Plan: Pt to f/u in 2 weeks w/ counseling.  Pt to f/u as scheduled w/ PCP and specialists. Practice mindfulness and grounding skills daily.    Individualized Treatment Plan Strengths: enjoys travel and time w/ family  Supports: husband, friends/church community   Goal/Needs for Treatment:  In order of importance to patient 1) cope w/ anxiety 2) -- 3) ---   Client Statement of Needs: Coping w/ the anxiety   Treatment Level:outpatient counseling  Symptoms:anxiety/on edge, difficulty w/ worry, mind constantly going.  If slows down fatigue.    Client Treatment Preferences:biweekly counseling.  Continue  w/ PCP and specialists.   Healthcare consumer's goal for treatment:  Counselor, Damien Herald, [facility] will support the patient's ability to achieve the goals identified. Cognitive Behavioral Therapy, Assertive Communication/Conflict Resolution Training, Relaxation Training, ACT, Humanistic and other evidenced-based practices will be used to promote progress towards healthy functioning.   Healthcare consumer will: Actively participate in therapy, working towards healthy functioning.    *Justification for Continuation/Discontinuation of Goal: R=Revised, O=Ongoing, A=Achieved, D=Discontinued  Goal 1) Increase use of relaxation/mindfulness and descalation skills to reduce and cop w/ symptoms of anxiety.   Baseline date 11/25/22: Progress towards goal 0; How Often - Daily Target Date Goal Was reviewed Status Code  Progress towards goal/Likert rating  11/25/23                Goal 2) Identify, challenge, and replace biased, fearful self-talk with positive, realistic, and  empowering self-talk. Baseline date 11/25/22: Progress towards goal 0; How Often - Daily Target Date Goal Was reviewed Status Code Progress towards goal  11/25/23                This plan has been reviewed and created by the following participants:  This plan will be reviewed at least every 12 months. Date Behavioral Health Clinician Date Guardian/Patient   11/25/22  [facility] [name] [facility] 11/25/22 Verbal Consent Provided                     [name] APPL [facility]

## 2023-11-20 ENCOUNTER — Encounter: Payer: Self-pay | Admitting: Family Medicine

## 2023-11-20 MED ORDER — UBRELVY 100 MG PO TABS: 1.0000 | ORAL_TABLET | Freq: Every day | ORAL | 0 refills | Status: DC | PRN

## 2023-11-20 MED ORDER — ATOMOXETINE HCL 40 MG PO CAPS: 40.0000 mg | ORAL_CAPSULE | Freq: Every day | ORAL | 0 refills | Status: AC

## 2023-11-21 ENCOUNTER — Encounter: Payer: Self-pay | Admitting: Oncology

## 2023-11-21 ENCOUNTER — Other Ambulatory Visit (HOSPITAL_COMMUNITY): Payer: Self-pay

## 2023-11-21 ENCOUNTER — Telehealth: Payer: Self-pay

## 2023-11-21 NOTE — Telephone Encounter (Signed)
 Pharmacy Patient Advocate Encounter   Received notification from Onbase that prior authorization for Ubrelvy 100 is required/requested.   Insurance verification completed.   The patient is insured through [employer].   Per test claim: PA required; PA started via CoverMyMeds. KEY [id number] . Waiting for clinical questions to populate.

## 2023-11-21 NOTE — Telephone Encounter (Signed)
 Clinical questions have been answered and PA submitted. PA currently Pending. Please be advised that most companies allow up to 30 days to make a decision. We will advise when a determination has been made, or follow up in 1 week.   Please reach out to our team, Rx Prior Auth Pool, if you haven't heard back in a week.

## 2023-11-24 ENCOUNTER — Other Ambulatory Visit (HOSPITAL_COMMUNITY): Payer: Self-pay

## 2023-11-24 NOTE — Telephone Encounter (Signed)
 Prior Authorization form/request asks a question that requires your assistance. Please see the question below and advise accordingly. The PA will not be submitted until the necessary information is received.

## 2023-11-25 ENCOUNTER — Ambulatory Visit (INDEPENDENT_AMBULATORY_CARE_PROVIDER_SITE_OTHER): Admitting: Physical Therapy

## 2023-11-25 ENCOUNTER — Encounter: Payer: Self-pay | Admitting: Physical Therapy

## 2023-11-25 DIAGNOSIS — M357 Hypermobility syndrome: Secondary | ICD-10-CM

## 2023-11-25 DIAGNOSIS — M542 Cervicalgia: Secondary | ICD-10-CM

## 2023-11-25 DIAGNOSIS — M5459 Other low back pain: Secondary | ICD-10-CM

## 2023-11-25 MED ORDER — UBRELVY 100 MG PO TABS: 1.0000 | ORAL_TABLET | Freq: Every day | ORAL | 0 refills | Status: AC | PRN

## 2023-11-25 NOTE — Telephone Encounter (Signed)
 Standard Quantity limit for [place] is 16 tablets per 30 day period.  Resent Rx for #16 with no refills to [facility] in [place].

## 2023-11-25 NOTE — Therapy (Signed)
 OUTPATIENT PHYSICAL THERAPY NOTE   Patient Name: Isabel Foley MRN: [id number] DOB:08/17/1970, 53 y.o., female Today's Date: 11/25/2023  END OF SESSION:  PT End of Session - 11/25/23 1355     Visit Number 2    Number of Visits 8    Date for Recertification  01/05/24    Authorization Type [employer]    PT Start Time 1352    PT Stop Time 1440    PT Time Calculation (min) 48 min    Activity Tolerance Patient tolerated treatment well    Behavior During Therapy WFL for tasks assessed/performed           Past Medical History:  Diagnosis Date   Abdominal pain, right upper quadrant    Allergic rhinitis, cause unspecified    Allergy-induced asthma    Anemia    Aneurysm of right renal artery    Anxiety    Arthritis    Depressive disorder, not elsewhere classified    Dizziness and giddiness    Esophageal reflux    Fibromuscular dysplasia    Gallstones    Headache    Liver lesion    Nontoxic multinodular goiter    Other chronic sinusitis    Other malaise and fatigue    Pain in limb    Peptic ulcer, unspecified site, unspecified as acute or chronic, without mention of hemorrhage, perforation, or obstruction    Premenstrual tension syndromes    Pure hypercholesterolemia    TIA (transient ischemic attack)    Unspecified hypothyroidism    Past Surgical History:  Procedure Laterality Date   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     with 2 stents   BREAST BIOPSY Right 06/01/2015   stereo, path pending   COLONOSCOPY WITH PROPOFOL  N/A 06/21/2022   Procedure: COLONOSCOPY WITH PROPOFOL ;  Surgeon: Unk Corinn Skiff, MD;  Location: [facility] ENDOSCOPY;  Service: Gastroenterology;  Laterality: N/A;   DILATION AND CURETTAGE OF UTERUS  01/08/2000   DILITATION & CURRETTAGE/HYSTROSCOPY WITH NOVASURE ABLATION N/A 12/06/2021   Procedure: DILATATION & CURETTAGE/HYSTEROSCOPY WITH NOVASURE ABLATION;  Surgeon: Estelle Service, MD;  Location: [facility] OR;  Service: Gynecology;   Laterality: N/A;   ESOPHAGOGASTRODUODENOSCOPY (EGD) WITH PROPOFOL  N/A 06/21/2022   Procedure: ESOPHAGOGASTRODUODENOSCOPY (EGD) WITH PROPOFOL ;  Surgeon: Unk Corinn Skiff, MD;  Location: [facility] ENDOSCOPY;  Service: Gastroenterology;  Laterality: N/A;   LEEP  01/07/1998   Patient Active Problem List   Diagnosis Date Noted   Pneumonia of right middle lobe due to infectious organism 10/07/2023   Leukocytosis 10/07/2023   Ehlers-Danlos syndrome (tentative) 10/03/2023   Hypermobility syndrome 09/11/2023   Bilateral carpal tunnel syndrome 09/11/2023   Flank pain 08/27/2023   Menopausal symptoms 08/27/2023   Attention or concentration deficit 06/17/2023   Fluctuating blood pressure 12/31/2022   Iron  overload 12/17/2022   Adenomatous polyp of ascending colon 06/21/2022   Iron  deficiency anemia due to chronic blood loss 11/20/2021   Generalized postprandial abdominal pain 05/29/2021   Chronic allergic conjunctivitis 02/22/2021   Fibromuscular dysplasia    Aneurysm of right renal artery    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   Aneurysm of splenic artery 02/08/2021   Chronic pain in left shoulder 10/26/2019   PTSD (post-traumatic stress disorder) 06/25/2018   Acute non-recurrent sinusitis 02/07/2017   Chronic pain of left knee 07/09/2016   GAD (generalized anxiety disorder) 06/11/2016   Pruritus 10/20/2015   Chronic back pain 03/21/2015   Decreased hearing of both ears 11/22/2014   Dizziness 10/04/2009   OTHER CHRONIC  SINUSITIS 12/12/2008   History of hypothyroidism 09/08/2008   HYPERCHOLESTEROLEMIA 09/08/2008   Depression, major, in remission 09/08/2008   Allergic rhinitis 09/08/2008   GERD 09/08/2008   Peptic ulcer 09/08/2008   Common migraine 09/08/2008    PCP: Avelina Greig BRAVO MD   REFERRING PROVIDER: Dr. Artist Lloyd, MD.  REFERRING DIAG: M54.2 (ICD-10-CM) - Cervicalgia M54.50,G89.29 (ICD-10-CM) - Chronic midline low back pain without sciatica M35.7 (ICD-10-CM) -  Hypermobility syndrome  Rationale for Evaluation and Treatment: Rehabilitation  THERAPY DIAG:  Hypermobility syndrome  Cervicalgia  Other low back pain  ONSET DATE: chronic  SUBJECTIVE:                                                                                                                                                                                           SUBJECTIVE STATEMENT: Pt still in the boot camp for HTN, upped her Metoprolol .  She continues to do hour long walking.     This patient has a 53 y.o. 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 In particular, she reports dizziness and leg weakness which is intermittent since a month ago.  Most of her pain is located in her neck and left lower back.  Pain started in L side of her neck.  The pain radiated to the Rt. and even in the back of her base of skull.  She had Rt UE pain as well but that has resolved.  POTS diagnosis is in question.  Patient's condition is complicated by she has had genetic testingFMA.  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 but Dr. Lloyd does not have the results.  She is followed closely by cardiology and neurologist.  She denies left-sided leg weakness associated with her back pain but does report lower body weakness with periods of walking.  She feels that her Rt UE is weaker- grip/hand.   Patient reports symptoms and/or instability in the following areas: DID NOT FULLY COMPLETE THIS ON EVAL:   - Cervical Spine: [x] Pain [] Instability [x] Limited ROM [] Paresthesia  - Thoracic Spine: [] Pain [] Instability  - Lumbar Spine: [x] Pain [] Instability [] Frequent locking/giving out  - Shoulder: [] L [] R  [] Subluxation [] Dislocations [] Pain [] Fatigue  - Elbow: [] L [] R  [] Instability [] Hyperextension [] Pain  - Wrist/Hand: [] L [] R  [] Instability [] Fatigue with use [] Pain  - Hip: [x] L [] R  [] Instability [] Pain [] Clicking [] Gait deviations  -  Knee: [] L [] R  [] Instability [] Hyperextension [] Pain  - Ankle/Foot: [x] L [x] R  [x] Instability [] Frequent sprains Pain   Does the patient have a diagnosis of any of the following: Fatigue[x]  Brain fog[x]  Lightheadedness[x]  Allergies[]  GI[]  Neurodiverse[x]  ADHD  Additional Notes:  ______________________________________   Patient-reported Beighton Score (if known): ______9_/9  Clinician-assessed Beighton Score (today): _NT______/9    Ehlers-Danlos evaluation is positive with a score of 5.  Patient also has recurrent musculoskeletal pain and widespread pain.   PERTINENT HISTORY:  Pneumonia, fibromyalgia, muscular dysplasia, allergies/sinus, asthma, vascular dissections, new HTN, chest pain, anemia, anxiety/depression, TIA, hypermobility and Ehlers-Danlos syndrome.   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    Relevant historical information:   MS: joint hypermobility, mild scoliosis Skin/Immune reactions: hyperextensible skin, poor wound healing Nervous system: fatigue, temperature intolerance, dizziness, clumsiness, pre-syncope, migraine Head/Spine: ADHD, slipping ribs CV: palpitations, dissection of the mesenteric artery, aneurism of the renal and splenic arteries, mild regurgitation, tachycardia GI: pain bloating Genitourinary: heavy menstrual cycle in the past Hands & Feet: turn purple at times, piezogenic papules    DDD, Fibromuscular Dysplasia (followed by Rheumatology Dr. 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      PAIN:  Are you having pain? Yes: NPRS scale: none current, was up to 8/10 Pain location: low back , R or L Pain description: tight  Aggravating factors: sitting,   Relieving factors: changing position Ankle pain and stiffness in AM   Are you having  pain? Yes: NPRS scale: none today.  Pain location: neck bilateral  Pain description: stiff, tension  Aggravating factors: cold, looking slightly up  Relieving factors: MHP, gets massages monthly   PRECAUTIONS: Other: cardiac No lifting more than 5 lbs, HR should stay less than 130 bpm  Minimze stretching, twisting     RED FLAGS: PT with current aneurysms    WEIGHT BEARING RESTRICTIONS: No  FALLS:  Has patient fallen in last 6 months? 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  DIAGNOSTIC FINDINGS:  10/23/23 Lumbar MRI IMPRESSION: 1. Small central disc protrusion at L5-S1 without significant stenosis or impingement. 2. Minimal disc bulging and facet hypertrophy at L4-L5 without stenosis or impingement. 3. Fibroid uterus  10/23/23 Cervical IMPRESSION: 1. C4-C5 degenerative changes with disc bulge and bilateral uncovertebral spurring causing mild right C5 foraminal narrowing. No spinal stenosis. 2. C5-C6 degenerative changes with disc space narrowing and mild diffuse disc bulge with left greater than right uncovertebral spurring. No spinal stenosis or foraminal narrowing. 3. Overall, changes are mildly progressed as compared to previous MRI from 2023.IMPRESSION: 1. Small central disc protrusion at L5-S1 without significant stenosis or impingement. 2. Minimal disc  bulging and facet hypertrophy at L4-L5 without stenosis or impingement. 3. Fibroid uterus   CARDIO/ORTHOSTATICS: Baseline RHR on beta blocker  Standing HR Baseline BP Standing BP   PATIENT SURVEYS:  Modified Oswestry: NT on eval   COGNITION: Overall cognitive status: Within functional limits for tasks assessed     SENSATION: WFL   POSTURE: No Significant postural limitations  PALPATION: Appropriate  tenderness in posterior cervicals, tension in upper traps Stiffness along cervical spinal segments, gentle   LUMBAR ROM:  WFL no pain, ext felt good  AROM eval  Flexion   Extension   Right lateral flexion   Left lateral flexion   Right rotation NT  Left rotation NT    (Blank rows = not tested)  LOWER EXTREMITY ROM:   WNL  Active  Right eval Left eval  Hip flexion  Tighter than RT    Hip extension    Hip abduction    Hip adduction    Hip internal rotation  Tight   Hip external rotation    Knee flexion    Knee extension +10 +10  Ankle dorsiflexion 0-5 deg 0-5 deg   Ankle plantarflexion 80+ 80+  Ankle inversion hyper hyper  Ankle eversion     (Blank rows = not tested)  LOWER EXTREMITY MMT:    MMT Right eval Left eval  Hip flexion 4+ 4+  Hip extension    Hip abduction    Hip adduction    Hip internal rotation    Hip external rotation    Knee flexion 5 5  Knee extension 5 5  Ankle dorsiflexion 5 5  Ankle plantarflexion    Ankle inversion    Ankle eversion     (Blank rows = not tested)  LUMBAR SPECIAL TESTS:  Neg SLR    CERVICAL ROM:  Limited in all planes due to tension, discomfort, appears fearful    Active ROM A/PROM (deg) 11/25/2023  Flexion 40  Extension 25  Right lateral flexion 30  Left lateral flexion 30  Right rotation 50  Left rotation 50   (Blank rows = not tested)  UE ROM: AROM NT on eval   Passive ROM Right 11/25/2023 Left 11/25/2023  Shoulder flexion    Shoulder extension    Shoulder abduction    Shoulder  adduction    Shoulder extension    Shoulder internal rotation hyper hyper  Shoulder external rotation hyper Hyper  Elbow flexion    Elbow extension    Wrist flexion    Wrist extension    Wrist ulnar deviation    Wrist radial deviation    Wrist pronation    Wrist supination     (Blank rows = not tested)  UE MMT:    4-/5 grossly in [facility]    MMT Right 11/25/2023 Left 11/25/2023  Shoulder flexion    Shoulder extension    Shoulder abduction    Shoulder adduction    Shoulder extension    Shoulder internal rotation 5 5  Shoulder external rotation 5 5  Elbow flexion    Elbow extension    Grip strength     (Blank rows = not tested)  CERVICAL SPECIAL TESTS:  NT   FUNCTIONAL TESTS:  SLS WNL Strain in lumbar spine with 90/90 lumbar DNF endurance testing 10 sec    GAIT: Distance walked: 150 Assistive device utilized: None Level of assistance: Complete Independence Comments: no deviations notable   TREATMENT DATE:    [facility] Adult PT Treatment:                                                DATE: 11/25/23 Therapeutic Exercise: Chin tuck Horizontal abduction x 15 ER yellow x 15  Flexion/[place] side-lying yellow band Bridging x 10 Hip abduction  x 15  Standing green band row, extension with cueing Therapeutic Activity: Cervical stability Self Care: Intake included self-care regarding ankle stiffness, cervical crepitus, review of guidance regarding FMD and aerobic versus resistance exercises. Reviewed avoidance of twisting with transitional movements  MD note : I have recommended that she focus on aerobic exercise obtaining 30 to 60 minutes at least 5 days a week of moderate intensity aerobic training. She should also encompass some low intensity resistance training (20% of overall training).  We discussed the absolute importance of avoiding lifting to fatigue and avoiding low-repetition, high-intensity resistance exercises (low-rep, max weight).  We discussed avoiding  weights that require Valsalva to lift.  I have recommended doing 1 day a week of low intensity resistance training.  This will focus on muscle toning as opposed to muscle building and using machines or bands to control the resistance is optimal.   [facility] Adult PT Treatment:                                                DATE: 11/10/23  Neuromuscular re-ed: Lesleigh tuck Light rotation cervical spine in supine TrA bracing, light with and without pelvic tilt  Self Care: Walking shorter bouts for safety Pain vs strain Degenerative Disc on MRI Flexion vs Ext based exercises  Posture and neck position                                                                                                                                   PATIENT EDUCATION:  Education details: see above Posture, alignment, neutral zone and joint protection PNE/Explain pain   Joint inflammation/collagen/ligament laxity, muscular support Stretching vs stabilizing  Person educated: Patient Education method: Explanation, Demonstration, and Handouts Education comprehension: verbalized understanding and needs further education  [email or web address]  The following are key points to remember from this Viewpoint article on physical activity and exercise in patients with spontaneous coronary artery dissection (SCAD) and fibromuscular dysplasia (FMD):  Patients with SCAD and FMD remain at risk of recurrent vascular dissection in the setting of increased mechanical and shear stress of the arteries. Extreme or unusual exercise has been reported as a trigger for SCAD in up to 29% of patients, although causation has not been proven. Exercise in general is associated with positive effects on vascular function, and as such, limiting exercise among patients with SCAD or FMD due to mechanistic risk should be avoided. With limited available data, moderate aerobic training among SCAD patients  is recommended for 30-40 minutes 5-7 days/week. Cardiac rehabilitation should be recommended for all patients. In addition, patients should be counseled to remain active using interval training and low resistance weight training. Activity such as endurance aerobic training, muscle building, and yoga with extreme head and neck positions should be done with caution. Patients should use  proper breathing and lifting techniques, avoid Valsalva or straining, and avoid high-intensity exercises in extreme environmental conditions. Patients with carotid or vertebral artery dissections should avoid exercises such as push-ups and sit-ups for 8-12 weeks after the acute dissection after which the recommendations would be similar as for SCAD. There are no data currently to support restricting activities such as riding a roller coaster, sky diving, scuba diving, or sexual intercourse.   HOME EXERCISE PROGRAM:  Access Code: [email or web address] URL: [email or web address] Date: 11/10/2023 Prepared by: Delon Norma  Access Code: [email or web address] URL: [email or web address] Date: 11/25/2023 Prepared by: Delon Norma  Exercises - Supine Cervical Retraction with Towel  - 1 x daily - 7 x weekly - 2 sets - 10 reps - 5 hold - Seated Cervical Rotation AROM  - 1 x daily - 7 x weekly - 2 sets - 10 reps - Supine Transversus Abdominis Bracing - Hands on Stomach  - 1 x daily - 7 x weekly - 2 sets - 10 reps - 5 hold - Supine Bridge  - 1 x daily - 7 x weekly - 2 sets - 10 reps - 5 hold - Sidelying Hip Abduction  - 1 x daily - 7 x weekly - 2 sets - 10 reps - 5 hold - Standing Shoulder Horizontal Abduction with Resistance  - 1 x daily - 7 x weekly - 2 sets - 10 reps - 5 hold - Shoulder External Rotation and Scapular Retraction with Resistance  - 1 x daily - 7 x weekly - 2 sets - 10 reps - 5 hold - Shoulder extension with resistance - Neutral  - 1 x daily - 7 x weekly - 2 sets - 10 reps - 5 hold - Standing Shoulder  Row with Anchored Resistance  - 1 x daily - 7 x weekly - 2 sets - 10 reps - 5 hold - Sidelying Shoulder Abduction with Resistance to 60 Degrees  - 1 x daily - 7 x weekly - 2 sets - 10 reps - 5 hold   ASSESSMENT:  CLINICAL IMPRESSION: Patient was given a bit of guidance from her doctor who diagnosed her in September 2024 which indicated she should do light resistance with bands or machines 1 day/week.  We initiated upper body strengthening today using like bands she continues to walk regularly.  It will benefit her to get a regular resistance program established with guidance from a physical therapist.  She reports that the vascular EDS has been ruled out.  She continues to modify transitional movements both for dysautonomia and FMD.   Patient is a 53y.o. 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  Physical therapy will proceed gently and limit torso rotations but try to instill confidence in being active.   OBJECTIVE IMPAIRMENTS: cardiopulmonary status limiting activity, decreased activity tolerance, decreased endurance, decreased mobility, difficulty walking, decreased ROM, decreased strength, increased fascial restrictions, impaired flexibility, impaired UE functional use, postural dysfunction, and pain.   ACTIVITY LIMITATIONS: carrying, lifting, bending, sitting, standing, squatting, stairs, reach over head, locomotion level, and caring for others  PARTICIPATION LIMITATIONS: meal prep, cleaning, laundry, interpersonal relationship, driving, shopping, and community activity  PERSONAL FACTORS: 3+ comorbidities: FMD- see above/cardiac, joint hypermobility, widespread pain are also affecting patient's functional outcome.   REHAB POTENTIAL: Good  CLINICAL DECISION MAKING: Unstable/unpredictable  EVALUATION COMPLEXITY: High   GOALS: Goals reviewed with  patient? Yes  SHORT TERM GOALS: Target date: 12/08/2023    Patient will be able to show independence for initial HEP to include posture, core and hip strength and stability.   Baseline: Goal status: INITIAL  2.  Patient will modify walking in shorter bouts for safety and pain management.   Baseline:  Goal status: INITIAL  3.  Patient will complete functional baseline testing and goals set Baseline:  Goal status: INITIAL   LONG TERM GOALS: Target date: 01/05/2024    Patient will be independent with final HEP upon discharge from PT and report consistent benefit following exercise completion.    Baseline:  Goal status: INITIAL  2.  Patient will report less neck pain/stiffness reduced by 25% in the morning hours Baseline:  Goal status: INITIAL  3.  Patient will be able to demonstrate proper posture and lifting techniques related to spine health and reduction of symptoms Baseline:  Goal status: INITIAL  4.  M-ODI score TBA Baseline:  Goal status: INITIAL  5.  Pt will be able to walk for 15 minutes or more without increasing leg weakness, dizziness  Baseline:  Goal status: INITIAL   PLAN:  PT FREQUENCY: 1x/week  PT DURATION: 8 weeks  PLANNED INTERVENTIONS: 97164- PT Re-evaluation, 97750- Physical Performance Testing, 97110-Therapeutic exercises, 97530- Therapeutic activity, [id number]- Neuromuscular re-education, 97535- Self Care, [id number]- Manual therapy, Patient/Family education, Balance training, Taping, Cryotherapy, and Moist heat.  PLAN FOR NEXT SESSION: Lower body resistance exercises.  Trial Wall sit, squat bridging calf raises .  breathing and avoid Valsalva, consider recumbent bike versus walking on treadmill.   Light stabilization cervical spine and lumbar spine, pelvis.   Okay to do light resistance with higher reps.  See above for exercise guidelines   Lashica Hannay, PT 11/25/2023, 2:57 PM   Delon Norma, PT 11/25/23 2:57 PM Phone: [phone] Fax:  [phone]

## 2023-11-26 ENCOUNTER — Telehealth: Payer: Self-pay

## 2023-11-26 ENCOUNTER — Other Ambulatory Visit (HOSPITAL_COMMUNITY): Payer: Self-pay

## 2023-11-26 NOTE — Telephone Encounter (Signed)
 PA request has been Received. New Encounter has been or will be created for follow up. For additional info see Pharmacy Prior Auth telephone encounter from 11/26/23.

## 2023-11-26 NOTE — Telephone Encounter (Signed)
 Pharmacy Patient Advocate Encounter   Received notification from Pt Calls Messages that prior authorization for Ubrelvy 100 is required/requested.   Insurance verification completed.   The patient is insured through [employer].   Per test claim: PA required; PA submitted to above mentioned insurance via Latent Key/confirmation #/EOC [id number] Status is pending

## 2023-11-27 ENCOUNTER — Encounter: Payer: Self-pay | Admitting: Psychology

## 2023-11-27 ENCOUNTER — Ambulatory Visit (INDEPENDENT_AMBULATORY_CARE_PROVIDER_SITE_OTHER): Admitting: Psychology

## 2023-11-27 DIAGNOSIS — F411 Generalized anxiety disorder: Secondary | ICD-10-CM | POA: Diagnosis not present

## 2023-11-27 DIAGNOSIS — F902 Attention-deficit hyperactivity disorder, combined type: Secondary | ICD-10-CM

## 2023-11-27 DIAGNOSIS — F4310 Post-traumatic stress disorder, unspecified: Secondary | ICD-10-CM

## 2023-11-27 NOTE — Progress Notes (Signed)
 [facility] Counselor/Therapist Progress Note  Patient ID: Isabel Isabel Foley Isabel Foley, MRN: [id number],    Date: 11/27/2023  Time Spent: 10:00am-11:01am   Pt is seen for a virtual video visit via caregility.  Pt joins from her home, reporting privacy, and counselor from her home office.  Pt consents to virtual visit and is aware of limitations of such visits.    Treatment Type: Individual Therapy  Reported Symptoms: pt reports significant family stressors and worry for kids  Mental Status Exam: Appearance:  Well Groomed     Behavior: Appropriate  Motor: Normal  Speech/Language:  Clear and Coherent and Normal Rate  Affect: Appropriate  Mood: anxious and worry  Thought process: normal  Thought content:   WNL  Sensory/Perceptual disturbances:   WNL  Orientation: oriented to person, place, time/date, and situation  Attention: Good  Concentration: Good  Memory: WNL  Fund of knowledge:  Good  Insight:   Good  Judgment:  Good  Impulse Control: Good   Risk Assessment: Danger to Self:  No Self-injurious Behavior: No Danger to Others: No Duty to Warn:no Physical Aggression / Violence:No  Access to Firearms a concern: No  Gang Involvement:No   Subjective: Counselor assessed pt current functioning per pt report.  Processed w/ pt  stressors and related emotions.  Explored anxiety and worries and assisted in identifying distortions.  Reflected ways of supporting kids and assisted in identifying what's to in I control.  Discussed focus on what's in her control and shift rumination and reframing distortions.  Encouraged continued mindful breathing for relaxation. Pt affect wnl.  Pt reports feeling continued stress, anxiety and worry-rumination.  Pt reports that positive in her daughter home visiting before stationed in [place].  Pt enjoying time but struggle w/ as she was driving on highway today w/ little experience.  Pt discussed stressor of her son not being supported by his father.   Pt discussed anxious w/continued conflict w/ her ex and oldest son.  Pt also reports her husband having more AFIB events.  Pt is able to focus identifying distortions and recognized ways of positive distraction from rumination and reframing.  Pt receptive to continue use of mindful breathing practice.   Interventions: Cognitive Behavioral Therapy, Mindfulness Meditation, and supportive  Diagnosis:GAD (generalized anxiety disorder)  PTSD (post-traumatic stress disorder)  Attention deficit hyperactivity disorder (ADHD), combined type, moderate  Plan: Pt to f/u in 2 weeks w/ counseling.  Pt to f/u as scheduled w/ PCP and specialists. Practice mindfulness and grounding skills daily.    Individualized Treatment Plan Strengths: enjoys travel and time w/ family  Supports: husband, friends/church community   Goal/Needs for Treatment:  In order of importance to patient 1) cope w/ anxiety 2) -- 3) ---   Client Statement of Needs: Coping w/ the anxiety   Treatment Level:outpatient counseling  Symptoms:anxiety/on edge, difficulty w/ worry, mind constantly going.  If slows down fatigue.    Client Treatment Preferences:biweekly counseling.  Continue  w/ PCP and specialists.   Healthcare consumer's goal for treatment:  Counselor, Isabel Foley Herald, [facility] will support the patient's ability to achieve the goals identified. Cognitive Behavioral Therapy, Assertive Communication/Conflict Resolution Training, Relaxation Training, ACT, Humanistic and other evidenced-based practices will be used to promote progress towards healthy functioning.   Healthcare consumer will: Actively participate in therapy, working towards healthy functioning.    *Justification for Continuation/Discontinuation of Goal: R=Revised, O=Ongoing, A=Achieved, D=Discontinued  Goal 1) Increase use of relaxation/mindfulness and descalation skills to reduce and cop w/ symptoms  of anxiety.   Baseline date 11/25/22: Progress towards goal  0; How Often - Daily Target Date Goal Was reviewed Status Code Progress towards goal/Likert rating  11/25/23 11/27/23 ongoing Continue goals/review and update next session  01/07/24           Goal 2) Identify, challenge, and replace biased, fearful self-talk with positive, realistic, and empowering self-talk. Baseline date 11/25/22: Progress towards goal 0; How Often - Daily Target Date Goal Was reviewed Status Code Progress towards goal  11/25/23 11/27/23 ongoing Continue goals/review and update next session  01/07/24           This plan has been reviewed and created by the following participants:  This plan will be reviewed at least every 12 months. Date Behavioral Health Clinician Date Guardian/Patient   11/25/22  [facility] Isabel Isabel Foley Isabel Foley 11/25/22 Verbal Consent Provided  11/27/23 Isabel Foley MH Isabel Isabel Foley [facility] 11/27/23 Verbal Consent Provided and electronic signature requested                Isabel Isabel Foley Isabel Foley               Isabel Isabel Foley Isabel Foley, Isabel Isabel Foley Isabel Foley

## 2023-12-01 ENCOUNTER — Ambulatory Visit (INDEPENDENT_AMBULATORY_CARE_PROVIDER_SITE_OTHER): Admitting: Physical Therapy

## 2023-12-01 ENCOUNTER — Encounter: Payer: Self-pay | Admitting: Physical Therapy

## 2023-12-01 DIAGNOSIS — M5459 Other low back pain: Secondary | ICD-10-CM

## 2023-12-01 DIAGNOSIS — M542 Cervicalgia: Secondary | ICD-10-CM

## 2023-12-01 DIAGNOSIS — M357 Hypermobility syndrome: Secondary | ICD-10-CM | POA: Diagnosis not present

## 2023-12-01 NOTE — Therapy (Signed)
 OUTPATIENT PHYSICAL THERAPY NOTE   Patient Name: Isabel Foley MRN: [id number] DOB:06-08-1970, 53 y.o., female Today's Date: 12/01/2023  END OF SESSION:  PT End of Session - 12/01/23 1023     Visit Number 3    Number of Visits 8    Date for Recertification  01/05/24    Authorization Type [employer]    PT Start Time 1020    PT Stop Time 1100    PT Time Calculation (min) 40 min    Activity Tolerance Patient tolerated treatment well    Behavior During Therapy WFL for tasks assessed/performed            Past Medical History:  Diagnosis Date   Abdominal pain, right upper quadrant    Allergic rhinitis, cause unspecified    Allergy-induced asthma    Anemia    Aneurysm of right renal artery    Anxiety    Arthritis    Depressive disorder, not elsewhere classified    Dizziness and giddiness    Esophageal reflux    Fibromuscular dysplasia    Gallstones    Headache    Liver lesion    Nontoxic multinodular goiter    Other chronic sinusitis    Other malaise and fatigue    Pain in limb    Peptic ulcer, unspecified site, unspecified as acute or chronic, without mention of hemorrhage, perforation, or obstruction    Premenstrual tension syndromes    Pure hypercholesterolemia    TIA (transient ischemic attack)    Unspecified hypothyroidism    Past Surgical History:  Procedure Laterality Date   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     with 2 stents   BREAST BIOPSY Right 06/01/2015   stereo, path pending   COLONOSCOPY WITH PROPOFOL  N/A 06/21/2022   Procedure: COLONOSCOPY WITH PROPOFOL ;  Surgeon: Unk Corinn Skiff, MD;  Location: [facility] ENDOSCOPY;  Service: Gastroenterology;  Laterality: N/A;   DILATION AND CURETTAGE OF UTERUS  01/08/2000   DILITATION & CURRETTAGE/HYSTROSCOPY WITH NOVASURE ABLATION N/A 12/06/2021   Procedure: DILATATION & CURETTAGE/HYSTEROSCOPY WITH NOVASURE ABLATION;  Surgeon: Estelle Service, MD;  Location: [facility] OR;  Service: Gynecology;   Laterality: N/A;   ESOPHAGOGASTRODUODENOSCOPY (EGD) WITH PROPOFOL  N/A 06/21/2022   Procedure: ESOPHAGOGASTRODUODENOSCOPY (EGD) WITH PROPOFOL ;  Surgeon: Unk Corinn Skiff, MD;  Location: [facility] ENDOSCOPY;  Service: Gastroenterology;  Laterality: N/A;   LEEP  01/07/1998   Patient Active Problem List   Diagnosis Date Noted   Pneumonia of right middle lobe due to infectious organism 10/07/2023   Leukocytosis 10/07/2023   Ehlers-Danlos syndrome (tentative) 10/03/2023   Hypermobility syndrome 09/11/2023   Bilateral carpal tunnel syndrome 09/11/2023   Flank pain 08/27/2023   Menopausal symptoms 08/27/2023   Attention or concentration deficit 06/17/2023   Fluctuating blood pressure 12/31/2022   Iron  overload 12/17/2022   Adenomatous polyp of ascending colon 06/21/2022   Iron  deficiency anemia due to chronic blood loss 11/20/2021   Generalized postprandial abdominal pain 05/29/2021   Chronic allergic conjunctivitis 02/22/2021   Fibromuscular dysplasia    Aneurysm of right renal artery    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   Aneurysm of splenic artery 02/08/2021   Chronic pain in left shoulder 10/26/2019   PTSD (post-traumatic stress disorder) 06/25/2018   Acute non-recurrent sinusitis 02/07/2017   Chronic pain of left knee 07/09/2016   GAD (generalized anxiety disorder) 06/11/2016   Pruritus 10/20/2015   Chronic back pain 03/21/2015   Decreased hearing of both ears 11/22/2014   Dizziness 10/04/2009   OTHER  CHRONIC SINUSITIS 12/12/2008   History of hypothyroidism 09/08/2008   HYPERCHOLESTEROLEMIA 09/08/2008   Depression, major, in remission 09/08/2008   Allergic rhinitis 09/08/2008   GERD 09/08/2008   Peptic ulcer 09/08/2008   Common migraine 09/08/2008    PCP: Avelina Greig BRAVO MD   REFERRING PROVIDER: Dr. Artist Lloyd, MD.  REFERRING DIAG: M54.2 (ICD-10-CM) - Cervicalgia M54.50,G89.29 (ICD-10-CM) - Chronic midline low back pain without sciatica M35.7 (ICD-10-CM) -  Hypermobility syndrome  Rationale for Evaluation and Treatment: Rehabilitation  THERAPY DIAG:  Hypermobility syndrome  Cervicalgia  Other low back pain  ONSET DATE: chronic  SUBJECTIVE:                                                                                                                                                                                           SUBJECTIVE STATEMENT: I have noticed the dizziness when I am standing , HR up to 120 or more.  Sitting improves symptoms, usually < 5 min.   Neck and back are really not too bad.  Once in awhile she gets a catch in her neck but not long lasting.    This patient has a 53 y.o. 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 In particular, she reports dizziness and leg weakness which is intermittent since a month ago.  Most of her pain is located in her neck and left lower back.  Pain started in L side of her neck.  The pain radiated to the Rt. and even in the back of her base of skull.  She had Rt UE pain as well but that has resolved.  POTS diagnosis is in question.  Patient's condition is complicated by she has had genetic testingFMA.  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 but Dr. Lloyd does not have the results.  She is followed closely by cardiology and neurologist.  She denies left-sided leg weakness associated with her back pain but does report lower body weakness with periods of walking.  She feels that her Rt UE is weaker- grip/hand.   Patient reports symptoms and/or instability in the following areas: DID NOT FULLY COMPLETE THIS ON EVAL:   - Cervical Spine: [x] Pain [] Instability [x] Limited ROM [] Paresthesia  - Thoracic Spine: [] Pain [] Instability  - Lumbar Spine: [x] Pain [] Instability [] Frequent locking/giving out  - Shoulder: [] L [] R  [] Subluxation [] Dislocations [] Pain [] Fatigue  - Elbow: [] L [] R  [] Instability [] Hyperextension [] Pain  -  Wrist/Hand: [] L [] R  [] Instability [] Fatigue with use [] Pain  - Hip: [x] L [] R  [] Instability [] Pain [] Clicking [] Gait deviations  - Knee: [] L [] R  [] Instability [] Hyperextension [] Pain  -  Ankle/Foot: [x] L [x] R  [x] Instability [] Frequent sprains Pain   Does the patient have a diagnosis of any of the following: Fatigue[x]  Brain fog[x]  Lightheadedness[x]  Allergies[]  GI[]  Neurodiverse[x]  ADHD  Additional Notes:  ______________________________________   Patient-reported Beighton Score (if known): ______9_/9  Clinician-assessed Beighton Score (today): _NT______/9    Ehlers-Danlos evaluation is positive with a score of 5.  Patient also has recurrent musculoskeletal pain and widespread pain.   PERTINENT HISTORY:  Pneumonia, fibromyalgia, muscular dysplasia, allergies/sinus, asthma, vascular dissections, new HTN, chest pain, anemia, anxiety/depression, TIA, hypermobility and Ehlers-Danlos syndrome.   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    Relevant historical information:   MS: joint hypermobility, mild scoliosis Skin/Immune reactions: hyperextensible skin, poor wound healing Nervous system: fatigue, temperature intolerance, dizziness, clumsiness, pre-syncope, migraine Head/Spine: ADHD, slipping ribs CV: palpitations, dissection of the mesenteric artery, aneurism of the renal and splenic arteries, mild regurgitation, tachycardia GI: pain bloating Genitourinary: heavy menstrual cycle in the past Hands & Feet: turn purple at times, piezogenic papules    DDD, Fibromuscular Dysplasia (followed by Rheumatology Dr. 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      PAIN:  Are you having pain? Yes: NPRS scale: none current, was up to 8/10 Pain location: low back , R or L Pain  description: tight  Aggravating factors: sitting,   Relieving factors: changing position Ankle pain and stiffness in AM   Are you having pain? Yes: NPRS scale: none today.  Pain location: neck bilateral  Pain description: stiff, tension  Aggravating factors: cold, looking slightly up  Relieving factors: MHP, gets massages monthly   PRECAUTIONS: Other: cardiac No lifting more than 5 lbs, HR should stay less than 130 bpm  Minimze stretching, twisting     RED FLAGS: PT with current aneurysms    WEIGHT BEARING RESTRICTIONS: No  FALLS:  Has patient fallen in last 6 months? 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  DIAGNOSTIC FINDINGS:  10/23/23 Lumbar MRI IMPRESSION: 1. Small central disc protrusion at L5-S1 without significant stenosis or impingement. 2. Minimal disc bulging and facet hypertrophy at L4-L5 without stenosis or impingement. 3. Fibroid uterus  10/23/23 Cervical IMPRESSION: 1. C4-C5 degenerative changes with disc bulge and bilateral uncovertebral spurring causing mild right C5 foraminal narrowing. No spinal stenosis. 2. C5-C6 degenerative changes with disc space narrowing and mild diffuse disc bulge with left greater than right uncovertebral spurring. No spinal stenosis or foraminal narrowing. 3. Overall, changes are mildly progressed as compared to  previous MRI from 2023.   CARDIO/ORTHOSTATICS: Baseline RHR on beta blocker  Standing HR Baseline BP Standing BP   PATIENT SURVEYS:  Modified Oswestry: NT on eval   COGNITION: Overall cognitive status: Within functional limits for tasks assessed     SENSATION: WFL   POSTURE: No Significant postural limitations  PALPATION: Appropriate tenderness in posterior cervicals, tension in upper traps Stiffness along cervical spinal segments, gentle   LUMBAR ROM:  WFL no pain, ext felt good  AROM eval  Flexion   Extension   Right lateral flexion   Left lateral flexion   Right rotation NT  Left rotation NT    (Blank rows = not tested)  LOWER EXTREMITY ROM:   WNL  Active  Right eval Left eval  Hip flexion  Tighter than RT    Hip extension    Hip abduction    Hip adduction    Hip internal rotation  Tight   Hip external rotation    Knee flexion    Knee extension +10 +10  Ankle dorsiflexion 0-5 deg 0-5 deg   Ankle plantarflexion 80+ 80+  Ankle inversion hyper hyper  Ankle eversion     (Blank rows = not tested)  LOWER EXTREMITY MMT:    MMT Right eval Left eval  Hip flexion 4+ 4+  Hip extension    Hip abduction    Hip adduction    Hip internal rotation    Hip external rotation    Knee flexion 5 5  Knee extension 5 5  Ankle dorsiflexion 5 5  Ankle plantarflexion    Ankle inversion    Ankle eversion     (Blank rows = not tested)  LUMBAR SPECIAL TESTS:  Neg SLR    CERVICAL ROM:  Limited in all planes due to tension, discomfort, appears fearful    Active ROM A/PROM (deg) 12/01/2023  Flexion 40  Extension 25  Right lateral flexion 30  Left lateral flexion 30  Right rotation 50  Left rotation 50   (Blank rows = not tested)  UE ROM: AROM NT on eval   Passive ROM Right 12/01/2023 Left 12/01/2023  Shoulder flexion    Shoulder extension    Shoulder abduction    Shoulder adduction    Shoulder extension    Shoulder internal rotation hyper  hyper  Shoulder external rotation hyper Hyper  Elbow flexion    Elbow extension    Wrist flexion    Wrist extension    Wrist ulnar deviation    Wrist radial deviation    Wrist pronation    Wrist supination     (Blank rows = not tested)  UE MMT:    4-/5 grossly in [facility]    MMT Right 12/01/2023 Left 12/01/2023  Shoulder flexion    Shoulder extension    Shoulder abduction    Shoulder adduction    Shoulder extension    Shoulder internal rotation 5 5  Shoulder external rotation 5 5  Elbow flexion    Elbow extension    Grip strength     (Blank rows = not tested)  CERVICAL SPECIAL TESTS:  NT   FUNCTIONAL TESTS:  SLS WNL Strain in lumbar spine with 90/90 lumbar DNF endurance testing 10 sec    GAIT: Distance walked: 150 Assistive device utilized: None Level of assistance: Complete Independence Comments: no deviations notable   TREATMENT DATE:   [facility] Adult PT Treatment:                                                DATE: 12/01/23 Therapeutic Exercise: NuStep L2 for 7 min, HR up to 130 bpm, RPE 9, asked her to slow down  Blue  band for row and extension x 15  Abdominal contraction with exhale Bridging x 10 Bridge with clam and sidelying clam with green band  Sidelying shoulder scaption and flexion yellow back for 45 sec  Self care:  Borg Scale/RPE for gauging effort without looking at HR  Structure to HEP for max outcomes  Goal of PT   Cardiac rehab, reviewed with patient info she brought in for FMD  [facility] Adult PT Treatment:                                                DATE: 11/25/23 Therapeutic Exercise: Chin tuck Horizontal abduction x 15 ER yellow x 15  Flexion/Scaption side-lying yellow band Bridging x 10 Hip abduction x 15  Standing green band row, extension with cueing Therapeutic Activity: Cervical stability Self Care: Intake included self-care regarding ankle stiffness, cervical crepitus, review of guidance regarding FMD and aerobic versus  resistance exercises. Reviewed avoidance of twisting with transitional movements  MD note : I have recommended that she focus on aerobic exercise obtaining 30 to 60 minutes at least 5 days a week of moderate intensity aerobic training. She should also encompass some low intensity resistance training (20% of overall training).  We discussed the absolute importance of avoiding lifting to fatigue and avoiding low-repetition, high-intensity resistance exercises (low-rep, max weight).  We discussed avoiding weights that require Valsalva to lift.  I have recommended doing 1 day a week of low intensity resistance training.  This will focus on muscle toning as opposed to muscle building and using machines or bands to control the resistance is optimal.   [facility] Adult PT Treatment:                                                DATE: 11/10/23  Neuromuscular re-ed: Lesleigh tuck Light rotation cervical spine in supine TrA bracing, light with and without pelvic tilt  Self Care: Walking shorter bouts for safety Pain vs strain Degenerative Disc on MRI Flexion vs Ext based exercises  Posture and neck position                                                                                                                                   PATIENT EDUCATION:  Education details: see above Posture, alignment, neutral zone and joint protection PNE/Explain pain   Joint inflammation/collagen/ligament laxity, muscular support Stretching vs stabilizing  Person educated: Patient Education method: Explanation, Demonstration, and Handouts Education comprehension: verbalized understanding and needs further education  [email or web address]  The following are key points to remember from this Viewpoint article on physical activity and exercise in patients with spontaneous coronary artery dissection (SCAD) and  fibromuscular dysplasia (FMD):  Patients with SCAD and FMD  remain at risk of recurrent vascular dissection in the setting of increased mechanical and shear stress of the arteries. Extreme or unusual exercise has been reported as a trigger for SCAD in up to 29% of patients, although causation has not been proven. Exercise in general is associated with positive effects on vascular function, and as such, limiting exercise among patients with SCAD or FMD due to mechanistic risk should be avoided. With limited available data, moderate aerobic training among SCAD patients is recommended for 30-40 minutes 5-7 days/week. Cardiac rehabilitation should be recommended for all patients. In addition, patients should be counseled to remain active using interval training and low resistance weight training. Activity such as endurance aerobic training, muscle building, and yoga with extreme head and neck positions should be done with caution. Patients should use proper breathing and lifting techniques, avoid Valsalva or straining, and avoid high-intensity exercises in extreme environmental conditions. Patients with carotid or vertebral artery dissections should avoid exercises such as push-ups and sit-ups for 8-12 weeks after the acute dissection after which the recommendations would be similar as for SCAD. There are no data currently to support restricting activities such as riding a roller coaster, sky diving, scuba diving, or sexual intercourse.   HOME EXERCISE PROGRAM:  Access Code: [email or web address] URL: [email or web address] Date: 11/10/2023 Prepared by: Delon Norma  Access Code: [email or web address] URL: [email or web address] Date: 11/25/2023 Prepared by: Delon Norma  Exercises - Supine Cervical Retraction with Towel  - 1 x daily - 7 x weekly - 2 sets - 10 reps - 5 hold - Seated Cervical Rotation AROM  - 1 x daily - 7 x weekly - 2 sets - 10 reps - Supine Transversus Abdominis Bracing - Hands on Stomach  - 1 x daily - 7 x weekly - 2 sets - 10 reps - 5  hold - Supine Bridge  - 1 x daily - 7 x weekly - 2 sets - 10 reps - 5 hold - Sidelying Hip Abduction  - 1 x daily - 7 x weekly - 2 sets - 10 reps - 5 hold - Standing Shoulder Horizontal Abduction with Resistance  - 1 x daily - 7 x weekly - 2 sets - 10 reps - 5 hold - Shoulder External Rotation and Scapular Retraction with Resistance  - 1 x daily - 7 x weekly - 2 sets - 10 reps - 5 hold - Shoulder extension with resistance - Neutral  - 1 x daily - 7 x weekly - 2 sets - 10 reps - 5 hold - Standing Shoulder Row with Anchored Resistance  - 1 x daily - 7 x weekly - 2 sets - 10 reps - 5 hold - Sidelying Shoulder Abduction with Resistance to 60 Degrees  - 1 x daily - 7 x weekly - 2 sets - 10 reps - 5 hold See med bridge   ASSESSMENT:  CLINICAL IMPRESSION:  Patient continues to walk with her husband, longer distances but she has her phone with her.  She was able to do her HEP with good form and feel appropriate muscle burn.  Introduced her to the RPE scale for aerobic effort. She will continue to benefit from skilled PT to help structure her fitness program to keep stable and safe in her neck.    Patient is a 53y.o. 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  Physical therapy will proceed gently and limit torso rotations but try to instill confidence in being active.   OBJECTIVE IMPAIRMENTS: cardiopulmonary status limiting activity, decreased activity tolerance, decreased endurance, decreased mobility, difficulty walking, decreased ROM, decreased strength, increased fascial restrictions, impaired flexibility, impaired UE functional use, postural dysfunction, and pain.   ACTIVITY LIMITATIONS: carrying, lifting, bending, sitting, standing, squatting, stairs, reach over head, locomotion level, and caring for others  PARTICIPATION LIMITATIONS: meal prep, cleaning,  laundry, interpersonal relationship, driving, shopping, and community activity  PERSONAL FACTORS: 3+ comorbidities: FMD- see above/cardiac, joint hypermobility, widespread pain are also affecting patient's functional outcome.   REHAB POTENTIAL: Good  CLINICAL DECISION MAKING: Unstable/unpredictable  EVALUATION COMPLEXITY: High   GOALS: Goals reviewed with patient? Yes  SHORT TERM GOALS: Target date: 12/08/2023  Patient will be able to show independence for initial HEP to include posture, core and hip strength and stability.   Baseline: Goal status: MET   2.  Patient will modify walking in shorter bouts for safety and pain management.   Baseline:  walks most days of the week, 3 miles - up to an hour for this Goal status: ongoing   3.  Patient will complete functional baseline testing and goals set Baseline:  Goal status: MET    LONG TERM GOALS: Target date: 01/05/2024   Patient will be independent with final HEP upon discharge from PT and report consistent benefit following exercise completion.    Baseline:  Goal status: INITIAL  2.  Patient will report less neck pain/stiffness reduced by 25% in the morning hours Baseline:  Goal status: INITIAL  3.  Patient will be able to demonstrate proper posture and lifting techniques related to spine health and reduction of symptoms Baseline:  Goal status: INITIAL  4.  Modified goal: Patient will self monitor effort, RPE for cardio in clinic.    Baseline: needs cues, given printout today   Goal status: NEW   5.  Pt will be able to walk for 15 minutes or more without increasing leg weakness, dizziness  Baseline:  Goal status: INITIAL   PLAN:  PT FREQUENCY: 1x/week  PT DURATION: 8 weeks  PLANNED INTERVENTIONS: 97164- PT Re-evaluation, 97750- Physical Performance Testing, 97110-Therapeutic exercises, 97530- Therapeutic activity, [id number]- Neuromuscular re-education, 97535- Self Care, [id number]- Manual therapy, Patient/Family  education, Balance training, Taping, Cryotherapy, and Moist heat.  PLAN FOR NEXT SESSION:  REVIEW FULL PROGRAM 1 day upper 1 Day lower 5 days cardio   Lower body resistance exercises.  Trial Wall sit, squat bridging calf raises.  breathing and avoid Valsalva, consider recumbent bike versus walking on treadmill.   Light stabilization cervical spine and lumbar spine, pelvis.   Okay to do light resistance with higher reps.  See above for exercise guidelines   Hope Holst, PT 12/01/2023, 10:24 AM   Delon Norma, PT 12/01/23 10:24 AM Phone: [phone] Fax: [phone]

## 2023-12-02 ENCOUNTER — Ambulatory Visit (INDEPENDENT_AMBULATORY_CARE_PROVIDER_SITE_OTHER): Admitting: Family Medicine

## 2023-12-02 VITALS — BP 104/78 | HR 88 | Ht 64.0 in | Wt 121.0 lb

## 2023-12-02 DIAGNOSIS — M357 Hypermobility syndrome: Secondary | ICD-10-CM | POA: Diagnosis not present

## 2023-12-02 DIAGNOSIS — I773 Arterial fibromuscular dysplasia: Secondary | ICD-10-CM | POA: Diagnosis not present

## 2023-12-02 DIAGNOSIS — G901 Familial dysautonomia [Riley-Day]: Secondary | ICD-10-CM | POA: Diagnosis not present

## 2023-12-02 NOTE — Patient Instructions (Addendum)
 Thank you for coming in today.   Continue PT and home exercises.   Continue to work on POTS exercises.   Guide to Prof. Pieter Patient Self-Care Handouts [email or web address]  Orthostatic Intolerance and Postural Orthostatic Tachycardia Self-Care Checklist [email or web address]  Steps To Managing Your Mast Cell Activation Syndrome [email or web address]  Recheck in February.   Let me know sooner if you have any problems.

## 2023-12-02 NOTE — Progress Notes (Signed)
   LILLETTE Ileana Collet, PhD, LAT, ATC acting as a scribe for Artist Lloyd, MD.  Isabel Foley is a 53 y.o. female who presents to [facility] today for f/u hypermobility, neck and back pain. Pt was last seen by Dr. Lloyd on 11/03/23 and was advised on fluid/salt intake, to try compression stockings, and famotidine. They also discussed genetic testing.  Today, pt reports she forgets to put on the stockings when going on walks. She purchased the famotidine, but hasn't started taking it yet. Dizzy spells continue, but less frequent. Metoprolol  increased to 50mg .  Dx testing: 10/23/23 C-spine & L-spine MRI   Pertinent review of systems: No fevers or chills  Relevant historical information: Fibromuscular dysplasia   Exam:  BP 104/78   Pulse 88   Ht 5' 4 (1.626 m)   Wt 121 lb (54.9 kg)   SpO2 98%   BMI 20.77 kg/m  General: Well Developed, well nourished, and in no acute distress.   MSK: Normal gait    Lab and Radiology Results No results found for this or any previous visit (from the past 72 hours). No results found.     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  She is walking a bit of a tight rope between blood pressure control and POTS symptoms.  Currently she is using metoprolol  50 mg with a average blood pressure in the systolic 90s.  She is doing some exercise but has been advised by her cardiology team to keep her heart rate less than 130 with exercise.  I do think some fluids to help support volume especially before exercise is a reasonable idea.  Exercise can be quite beneficial for POTS and dysautonomia and generally should be reasonably safe if Moderate for aneurysms and fibromuscular dysplasia.  We also spent time talking about PT and mast cell and reviewed home exercise program and self-care guidance.  Recheck in about 2 months.  PDMP not reviewed  this encounter. No orders of the defined types were placed in this encounter.  No orders of the defined types were placed in this encounter.    Discussed warning signs or symptoms. Please see discharge instructions. Patient expresses understanding.   The above documentation has been reviewed and is accurate and complete Artist Lloyd, M.D. Total encounter time 30 minutes including face-to-face time with the patient and, reviewing past medical record, and charting on the date of service.

## 2023-12-08 ENCOUNTER — Ambulatory Visit: Admitting: Physical Therapy

## 2023-12-08 ENCOUNTER — Other Ambulatory Visit (HOSPITAL_COMMUNITY): Payer: Self-pay

## 2023-12-08 ENCOUNTER — Encounter: Payer: Self-pay | Admitting: Physical Therapy

## 2023-12-08 DIAGNOSIS — M357 Hypermobility syndrome: Secondary | ICD-10-CM | POA: Diagnosis not present

## 2023-12-08 DIAGNOSIS — M542 Cervicalgia: Secondary | ICD-10-CM

## 2023-12-08 DIAGNOSIS — M5459 Other low back pain: Secondary | ICD-10-CM

## 2023-12-08 NOTE — Therapy (Addendum)
 " OUTPATIENT PHYSICAL THERAPY NOTE  ADDENDED DISCHARGE  Patient Name: Isabel Foley MRN: [id number] DOB:[birth year], 53 y.o., female Today's Date: 12/08/2023   PHYSICAL THERAPY DISCHARGE SUMMARY  Visits from Start of Care: 4  Current functional level related to goals / functional outcomes: See below    Remaining deficits: At the time of the last visit patient was doing well at a baseline level including home exercise program light resistance training and walking.   Education / Equipment: Core stability exercise heart rate monitoring RPE  Patient agrees to discharge. Patient goals were met. Patient is being discharged due to not returning since the last visit.  END OF SESSION:  PT End of Session - 12/08/23 1304     Visit Number 4    Number of Visits 8    Date for Recertification  01/05/24    Authorization Type [employer]    PT Start Time 1302    PT Stop Time 1350    PT Time Calculation (min) 48 min    Activity Tolerance Patient tolerated treatment well    Behavior During Therapy WFL for tasks assessed/performed            Past Medical History:  Diagnosis Date   Abdominal pain, right upper quadrant    Allergic rhinitis, cause unspecified    Allergy-induced asthma    Anemia    Aneurysm of right renal artery    Anxiety    Arthritis    Depressive disorder, not elsewhere classified    Dizziness and giddiness    Esophageal reflux    Fibromuscular dysplasia    Gallstones    Headache    Liver lesion    Nontoxic multinodular goiter    Other chronic sinusitis    Other malaise and fatigue    Pain in limb    Peptic ulcer, unspecified site, unspecified as acute or chronic, without mention of hemorrhage, perforation, or obstruction    Premenstrual tension syndromes    Pure hypercholesterolemia    TIA (transient ischemic attack)    Unspecified hypothyroidism    Past Surgical History:  Procedure Laterality Date   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      with 2 stents   BREAST BIOPSY Right 06/01/2015   stereo, path pending   COLONOSCOPY WITH PROPOFOL  N/A 06/21/2022   Procedure: COLONOSCOPY WITH PROPOFOL ;  Surgeon: Unk Corinn Skiff, MD;  Location: [facility] ENDOSCOPY;  Service: Gastroenterology;  Laterality: N/A;   DILATION AND CURETTAGE OF UTERUS  01/08/2000   DILITATION & CURRETTAGE/HYSTROSCOPY WITH NOVASURE ABLATION N/A 12/06/2021   Procedure: DILATATION & CURETTAGE/HYSTEROSCOPY WITH NOVASURE ABLATION;  Surgeon: Estelle Service, MD;  Location: [facility] OR;  Service: Gynecology;  Laterality: N/A;   ESOPHAGOGASTRODUODENOSCOPY (EGD) WITH PROPOFOL  N/A 06/21/2022   Procedure: ESOPHAGOGASTRODUODENOSCOPY (EGD) WITH PROPOFOL ;  Surgeon: Unk Corinn Skiff, MD;  Location: [facility] ENDOSCOPY;  Service: Gastroenterology;  Laterality: N/A;   LEEP  01/07/1998   Patient Active Problem List   Diagnosis Date Noted   Pneumonia of right middle lobe due to infectious organism 10/07/2023   Leukocytosis 10/07/2023   Ehlers-Danlos syndrome (tentative) 10/03/2023   Hypermobility syndrome 09/11/2023   Bilateral carpal tunnel syndrome 09/11/2023   Flank pain 08/27/2023   Menopausal symptoms 08/27/2023   Attention or concentration deficit 06/17/2023   Fluctuating blood pressure 12/31/2022   Iron  overload 12/17/2022   Adenomatous polyp of ascending colon 06/21/2022   Iron  deficiency anemia due to chronic blood loss 11/20/2021   Generalized postprandial abdominal pain 05/29/2021   Chronic allergic  conjunctivitis 02/22/2021   Fibromuscular dysplasia    Aneurysm of right renal artery    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   Aneurysm of splenic artery 02/08/2021   Chronic pain in left shoulder 10/26/2019   PTSD (post-traumatic stress disorder) 06/25/2018   Acute non-recurrent sinusitis 02/07/2017   Chronic pain of left knee 07/09/2016   GAD (generalized anxiety disorder) 06/11/2016   Pruritus 10/20/2015   Chronic back pain 03/21/2015   Decreased hearing of  both ears 11/22/2014   Dizziness 10/04/2009   OTHER CHRONIC SINUSITIS 12/12/2008   History of hypothyroidism 09/08/2008   HYPERCHOLESTEROLEMIA 09/08/2008   Depression, major, in remission 09/08/2008   Allergic rhinitis 09/08/2008   GERD 09/08/2008   Peptic ulcer 09/08/2008   Common migraine 09/08/2008    PCP: Avelina Greig BRAVO MD   REFERRING PROVIDER: Dr. Artist Lloyd, MD.  REFERRING DIAG: M54.2 (ICD-10-CM) - Cervicalgia M54.50,G89.29 (ICD-10-CM) - Chronic midline low back pain without sciatica M35.7 (ICD-10-CM) - Hypermobility syndrome  Rationale for Evaluation and Treatment: Rehabilitation  THERAPY DIAG:  Hypermobility syndrome  Cervicalgia  Other low back pain  ONSET DATE: chronic  SUBJECTIVE:                                                                                                                                                                                           SUBJECTIVE STATEMENT: No pain at the moment.  I wake up with my ankles stiff, painful in AM.   When I do sidesteps it hurts my outer foot, ankle.  Wears brace at times in the house.  Saw Dr. Lloyd and cardiologist.  Not taking Metoprolol , showed me her HR variability for today,    This patient has a 53 y.o. 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 In particular, she reports dizziness and leg weakness which is intermittent since a month ago.  Most of her pain is located in her neck and left lower back.  Pain started in L side of her neck.  The pain radiated to the Rt. and even in the back of her base of skull.  She had Rt UE pain as well but that has resolved.  POTS diagnosis is in question.  Patient's condition is complicated by she has had genetic testingFMA.  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 but Dr. Lloyd does not have the results.  She is followed closely by cardiology and neurologist.  She denies left-sided leg weakness associated  with her back pain but does report lower body weakness with periods of walking.  She feels that her Rt UE is weaker- grip/hand.  Patient reports symptoms and/or instability in the following areas: DID NOT FULLY COMPLETE THIS ON EVAL:   - Cervical Spine: [x] Pain [] Instability [x] Limited ROM [] Paresthesia  - Thoracic Spine: [] Pain [] Instability  - Lumbar Spine: [x] Pain [] Instability [] Frequent locking/giving out  - Shoulder: [] L [] R  [] Subluxation [] Dislocations [] Pain [] Fatigue  - Elbow: [] L [] R  [] Instability [] Hyperextension [] Pain  - Wrist/Hand: [] L [] R  [] Instability [] Fatigue with use [] Pain  - Hip: [x] L [] R  [] Instability [] Pain [] Clicking [] Gait deviations  - Knee: [] L [] R  [] Instability [] Hyperextension [] Pain  - Ankle/Foot: [x] L [x] R  [x] Instability [] Frequent sprains Pain   Does the patient have a diagnosis of any of the following: Fatigue[x]  Brain fog[x]  Lightheadedness[x]  Allergies[]  GI[]  Neurodiverse[x]  ADHD  Additional Notes:  ______________________________________   Patient-reported Beighton Score (if known): ______9_/9  Clinician-assessed Beighton Score (today): _NT______/9    Ehlers-Danlos evaluation is positive with a score of 5.  Patient also has recurrent musculoskeletal pain and widespread pain.   PERTINENT HISTORY:  Pneumonia, fibromyalgia, muscular dysplasia, allergies/sinus, asthma, vascular dissections, new HTN, chest pain, anemia, anxiety/depression, TIA, hypermobility and Ehlers-Danlos syndrome.   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    Relevant historical information:   MS: joint hypermobility, mild scoliosis Skin/Immune reactions: hyperextensible skin, poor wound healing Nervous system: fatigue, temperature intolerance, dizziness, clumsiness, pre-syncope, migraine Head/Spine: ADHD, slipping ribs CV: palpitations, dissection of the mesenteric artery, aneurism of the renal and splenic arteries,  mild regurgitation, tachycardia GI: pain bloating Genitourinary: heavy menstrual cycle in the past Hands & Feet: turn purple at times, piezogenic papules    DDD, Fibromuscular Dysplasia (followed by Rheumatology Dr. 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      PAIN:  Are you having pain? Yes: NPRS scale: none current, was up to 8/10 Pain location: low back , R or L Pain description: tight  Aggravating factors: sitting,   Relieving factors: changing position Ankle pain and stiffness in AM   Are you having pain? Yes: NPRS scale: none today.  Pain location: neck bilateral  Pain description: stiff, tension  Aggravating factors: cold, looking slightly up  Relieving factors: MHP, gets massages monthly   PRECAUTIONS: Other: cardiac No lifting more than 5 lbs, HR should stay less than 130 bpm  Minimze stretching, twisting     RED FLAGS: PT with current aneurysms    WEIGHT BEARING RESTRICTIONS: No  FALLS:  Has patient fallen in last 6 months? 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  DIAGNOSTIC FINDINGS:  10/23/23 Lumbar MRI IMPRESSION: 1. Small central disc protrusion at L5-S1 without significant  stenosis or impingement. 2. Minimal disc bulging and facet hypertrophy at L4-L5 without stenosis or impingement. 3. Fibroid uterus  10/23/23 Cervical IMPRESSION: 1. C4-C5 degenerative changes with disc bulge and bilateral uncovertebral spurring causing mild right C5 foraminal narrowing.  No spinal stenosis. 2. C5-C6 degenerative changes with disc space narrowing and mild diffuse disc bulge with left greater than right uncovertebral spurring. No spinal stenosis or foraminal narrowing. 3. Overall, changes are mildly progressed as compared to previous MRI from 2023.   CARDIO/ORTHOSTATICS: Baseline RHR on beta blocker  Standing HR Baseline BP Standing BP   PATIENT SURVEYS:  Modified Oswestry: NT on eval   COGNITION: Overall cognitive status: Within functional limits for tasks assessed     SENSATION: WFL   POSTURE: No Significant postural limitations  PALPATION: Appropriate tenderness in posterior cervicals, tension in upper traps Stiffness along cervical spinal segments, gentle   LUMBAR ROM:  WFL no pain, ext felt good  AROM eval  Flexion   Extension   Right lateral flexion   Left lateral flexion   Right rotation NT  Left rotation NT    (Blank rows = not tested)  LOWER EXTREMITY ROM:   WNL  Active  Right eval Left eval  Hip flexion  Tighter than RT    Hip extension    Hip abduction    Hip adduction    Hip internal rotation  Tight   Hip external rotation    Knee flexion    Knee extension +10 +10  Ankle dorsiflexion 0-5 deg 0-5 deg   Ankle plantarflexion 80+ 80+  Ankle inversion hyper hyper  Ankle eversion     (Blank rows = not tested)  LOWER EXTREMITY MMT:    MMT Right eval Left eval  Hip flexion 4+ 4+  Hip extension    Hip abduction    Hip adduction    Hip internal rotation    Hip external rotation    Knee flexion 5 5  Knee extension 5 5  Ankle dorsiflexion 5 5  Ankle plantarflexion    Ankle inversion    Ankle eversion     (Blank rows  = not tested)  LUMBAR SPECIAL TESTS:  Neg SLR    CERVICAL ROM:  Limited in all planes due to tension, discomfort, appears fearful    Active ROM A/PROM (deg) 12/08/2023  Flexion 40  Extension 25  Right lateral flexion 30  Left lateral flexion 30  Right rotation 50  Left rotation 50   (Blank rows = not tested)  UE ROM: AROM NT on eval   Passive ROM Right 12/08/2023 Left 12/08/2023  Shoulder flexion    Shoulder extension    Shoulder abduction    Shoulder adduction    Shoulder extension    Shoulder internal rotation hyper hyper  Shoulder external rotation hyper Hyper  Elbow flexion    Elbow extension    Wrist flexion    Wrist extension    Wrist ulnar deviation    Wrist radial deviation    Wrist pronation    Wrist supination     (Blank rows = not tested)  UE MMT:    4-/5 grossly in [facility]    MMT Right 12/08/2023 Left 12/08/2023  Shoulder flexion    Shoulder extension    Shoulder abduction    Shoulder adduction    Shoulder extension    Shoulder internal rotation 5 5  Shoulder external rotation 5 5  Elbow flexion    Elbow extension    Grip strength     (Blank rows = not tested)  CERVICAL SPECIAL TESTS:  NT   FUNCTIONAL TESTS:  SLS WNL Strain in lumbar spine with 90/90 lumbar DNF endurance testing 10 sec    GAIT: Distance walked: 150  Assistive device utilized: None Level of assistance: Complete Independence Comments: no deviations notable   TREATMENT DATE:   Pain in the Rt hip after PT last time.  Went away.   [facility] Adult PT Treatment:                                                DATE: 12/08/23 Therapeutic Exercise no: Recumbent bike level 1-3 for 6 min , 30 sec intervals added resistance  Step ups 6 inch (runner's step)  Lateral step up 6 inch  Tandem SLS Narrow stance with head turns and nods Standing palloff press blue band x 10 each side Self Care: Anti rotation Foot and ankle/balance exercises Posture body mechanics, lifting  [facility]  Adult PT Treatment:                                                DATE: 12/01/23 Therapeutic Exercise: NuStep L2 for 7 min, HR up to 130 bpm, RPE 9, asked her to slow down  Blue band for row and extension x 15  Abdominal contraction with exhale Bridging x 10 Bridge with clam and sidelying clam with green band  Sidelying shoulder scaption and flexion yellow back for 45 sec  Self care:  Borg Scale/RPE for gauging effort without looking at HR  Structure to HEP for max outcomes  Goal of PT   Cardiac rehab, reviewed with patient info she brought in for FMD  [facility] Adult PT Treatment:                                                DATE: 11/25/23 Therapeutic Exercise: Chin tuck Horizontal abduction x 15 ER yellow x 15  Flexion/Scaption side-lying yellow band Bridging x 10 Hip abduction x 15  Standing green band row, extension with cueing Therapeutic Activity: Cervical stability Self Care: Intake included self-care regarding ankle stiffness, cervical crepitus, review of guidance regarding FMD and aerobic versus resistance exercises. Reviewed avoidance of twisting with transitional movements  MD note : I have recommended that she focus on aerobic exercise obtaining 30 to 60 minutes at least 5 days a week of moderate intensity aerobic training. She should also encompass some low intensity resistance training (20% of overall training).  We discussed the absolute importance of avoiding lifting to fatigue and avoiding low-repetition, high-intensity resistance exercises (low-rep, max weight).  We discussed avoiding weights that require Valsalva to lift.  I have recommended doing 1 day a week of low intensity resistance training.  This will focus on muscle toning as opposed to muscle building and using machines or bands to control the resistance is optimal.   [facility] Adult PT Treatment:                                                DATE: 11/10/23  Neuromuscular re-ed: Lesleigh tuck Light rotation  cervical spine in supine TrA bracing, light with and without pelvic tilt  Self Care: Walking shorter  bouts for safety Pain vs strain Degenerative Disc on MRI Flexion vs Ext based exercises  Posture and neck position                                                                                                                                   PATIENT EDUCATION:  Education details: see above Posture, alignment, neutral zone and joint protection PNE/Explain pain   Joint inflammation/collagen/ligament laxity, muscular support Stretching vs stabilizing  Person educated: Patient Education method: Explanation, Demonstration, and Handouts Education comprehension: verbalized understanding and needs further education  [email or web address]  The following are key points to remember from this [employer] article on physical activity and exercise in patients with spontaneous coronary artery dissection (SCAD) and fibromuscular dysplasia (FMD):  Patients with SCAD and FMD remain at risk of recurrent vascular dissection in the setting of increased mechanical and shear stress of the arteries. Extreme or unusual exercise has been reported as a trigger for SCAD in up to 29% of patients, although causation has not been proven. Exercise in general is associated with positive effects on vascular function, and as such, limiting exercise among patients with SCAD or FMD due to mechanistic risk should be avoided. With limited available data, moderate aerobic training among SCAD patients is recommended for 30-40 minutes 5-7 days/week. Cardiac rehabilitation should be recommended for all patients. In addition, patients should be counseled to remain active using interval training and low resistance weight training. Activity such as endurance aerobic training, muscle building, and yoga with extreme head and neck positions should be done with caution. Patients should  use proper breathing and lifting techniques, avoid Valsalva or straining, and avoid high-intensity exercises in extreme environmental conditions. Patients with carotid or vertebral artery dissections should avoid exercises such as push-ups and sit-ups for 8-12 weeks after the acute dissection after which the recommendations would be similar as for SCAD. There are no data currently to support restricting activities such as riding a roller coaster, sky diving, scuba diving, or sexual intercourse.   HOME EXERCISE PROGRAM:  Access Code: [email or web address] URL: [email or web address] Date: 11/10/2023 Prepared by: Delon Norma  Access Code: [email or web address] URL: [email or web address] Date: 11/25/2023 Prepared by: Delon Norma  Exercises - Supine Cervical Retraction with Towel  - 1 x daily - 7 x weekly - 2 sets - 10 reps - 5 hold - Seated Cervical Rotation AROM  - 1 x daily - 7 x weekly - 2 sets - 10 reps - Supine Transversus Abdominis Bracing - Hands on Stomach  - 1 x daily - 7 x weekly - 2 sets - 10 reps - 5 hold - Supine Bridge  - 1 x daily - 7 x weekly - 2 sets - 10 reps - 5 hold - Sidelying Hip Abduction  - 1 x daily - 7 x weekly - 2 sets - 10 reps - 5 hold - Standing Shoulder Horizontal Abduction with Resistance  - 1 x daily - 7  x weekly - 2 sets - 10 reps - 5 hold - Shoulder External Rotation and Scapular Retraction with Resistance  - 1 x daily - 7 x weekly - 2 sets - 10 reps - 5 hold - Shoulder extension with resistance - Neutral  - 1 x daily - 7 x weekly - 2 sets - 10 reps - 5 hold - Standing Shoulder Row with Anchored Resistance  - 1 x daily - 7 x weekly - 2 sets - 10 reps - 5 hold - Sidelying Shoulder Abduction with Resistance to 60 Degrees  - 1 x daily - 7 x weekly - 2 sets - 10 reps - 5 hold See med bridge   ASSESSMENT:  CLINICAL IMPRESSION: Patient continues to do quite well overall considering her condition.  She has changed to walking indoors recently and has less  dizziness due to level surface.  If she is outside she still has some lightheadedness and leg weakness when she goes up hills.  At times she feels her ankles are weak so we worked on a couple of standing ankle and balance exercises today.  Her neck pain is mostly when she is in a position of extended periods of extension or lying on her side on the couch.  She has a full exercise program but would benefit from about 1 more visit to review some supine core stabilization in case her lower back becomes an issue in the future.  Patient is a 53y.o. 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  Physical therapy will proceed gently and limit torso rotations but try to instill confidence in being active.   OBJECTIVE IMPAIRMENTS: cardiopulmonary status limiting activity, decreased activity tolerance, decreased endurance, decreased mobility, difficulty walking, decreased ROM, decreased strength, increased fascial restrictions, impaired flexibility, impaired UE functional use, postural dysfunction, and pain.   ACTIVITY LIMITATIONS: carrying, lifting, bending, sitting, standing, squatting, stairs, reach over head, locomotion level, and caring for others  PARTICIPATION LIMITATIONS: meal prep, cleaning, laundry, interpersonal relationship, driving, shopping, and community activity  PERSONAL FACTORS: 3+ comorbidities: FMD- see above/cardiac, joint hypermobility, widespread pain are also affecting patient's functional outcome.   REHAB POTENTIAL: Good  CLINICAL DECISION MAKING: Unstable/unpredictable  EVALUATION COMPLEXITY: High   GOALS: Goals reviewed with patient? Yes  SHORT TERM GOALS: Target date: 12/08/2023  Patient will be able to show independence for initial HEP to include posture, core and hip strength and stability.   Baseline: Goal status: MET   2.  Patient  will modify walking in shorter bouts for safety and pain management.   Baseline:  walks most days of the week, 3 miles - up to an hour for this Goal status: ongoing   3.  Patient will complete functional baseline testing and goals set Baseline:  Goal status: MET    LONG TERM GOALS: Target date: 01/05/2024   Patient will be independent with final HEP upon discharge from PT and report consistent benefit following exercise completion.    Baseline:  Goal status: MET   2.  Patient will report less neck pain/stiffness reduced by 25% in the morning hours Baseline: - has pain with malpositioning and anxiety Goal status: partially met  3.  Patient will be able to demonstrate proper posture and lifting techniques related to spine health and reduction of symptoms Baseline:  Goal status: partially met   4.  Modified goal: Patient will  self monitor effort, RPE for cardio in clinic.    Baseline: needs cues, given printout today   Goal status: MET   5.  Pt will be able to walk for 15 minutes or more without increasing leg weakness, dizziness  Baseline: does not get as dizzy inside, has rolling hills outside,which is more demanding  Goal status: partially met  multifactorial    PLAN:  PT FREQUENCY: 1x/week  PT DURATION: 8 weeks  PLANNED INTERVENTIONS: 97164- PT Re-evaluation, 97750- Physical Performance Testing, 97110-Therapeutic exercises, 97530- Therapeutic activity, [id number]- Neuromuscular re-education, 97535- Self Care, [id number]- Manual therapy, Patient/Family education, Balance training, Taping, Cryotherapy, and Moist heat.  PLAN FOR NEXT SESSION:  REVIEW FULL PROGRAM 1 day upper 1 Day lower 5 days cardio   Lower body resistance exercises.  Trial Wall sit, squat bridging calf raises.  breathing and avoid Valsalva, consider recumbent bike versus walking on treadmill.   Light stabilization cervical spine and lumbar spine, pelvis.   Okay to do light resistance with higher reps.  See  above for exercise guidelines   Emilliano Dilworth, PT 12/08/2023, 1:58 PM   Delon Norma, PT 12/08/23 1:58 PM Phone: [phone] Fax: [phone]  "

## 2023-12-15 ENCOUNTER — Encounter: Admitting: Physical Therapy

## 2023-12-15 ENCOUNTER — Encounter: Payer: Self-pay | Admitting: Physical Therapy

## 2023-12-15 NOTE — Therapy (Deleted)
 OUTPATIENT PHYSICAL THERAPY NOTE   Patient Name: Isabel Foley MRN: [id number] DOB:1971-01-06, 53 y.o., female Today's Date: 12/15/2023  END OF SESSION:      Past Medical History:  Diagnosis Date   Abdominal pain, right upper quadrant    Allergic rhinitis, cause unspecified    Allergy-induced asthma    Anemia    Aneurysm of right renal artery    Anxiety    Arthritis    Depressive disorder, not elsewhere classified    Dizziness and giddiness    Esophageal reflux    Fibromuscular dysplasia    Gallstones    Headache    Liver lesion    Nontoxic multinodular goiter    Other chronic sinusitis    Other malaise and fatigue    Pain in limb    Peptic ulcer, unspecified site, unspecified as acute or chronic, without mention of hemorrhage, perforation, or obstruction    Premenstrual tension syndromes    Pure hypercholesterolemia    TIA (transient ischemic attack)    Unspecified hypothyroidism    Past Surgical History:  Procedure Laterality Date   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     with 2 stents   BREAST BIOPSY Right 06/01/2015   stereo, path pending   COLONOSCOPY WITH PROPOFOL  N/A 06/21/2022   Procedure: COLONOSCOPY WITH PROPOFOL ;  Surgeon: Unk Corinn Skiff, MD;  Location: [facility] ENDOSCOPY;  Service: Gastroenterology;  Laterality: N/A;   DILATION AND CURETTAGE OF UTERUS  01/08/2000   DILITATION & CURRETTAGE/HYSTROSCOPY WITH NOVASURE ABLATION N/A 12/06/2021   Procedure: DILATATION & CURETTAGE/HYSTEROSCOPY WITH NOVASURE ABLATION;  Surgeon: Estelle Service, MD;  Location: [facility] OR;  Service: Gynecology;  Laterality: N/A;   ESOPHAGOGASTRODUODENOSCOPY (EGD) WITH PROPOFOL  N/A 06/21/2022   Procedure: ESOPHAGOGASTRODUODENOSCOPY (EGD) WITH PROPOFOL ;  Surgeon: Unk Corinn Skiff, MD;  Location: [facility] ENDOSCOPY;  Service: Gastroenterology;  Laterality: N/A;   LEEP  01/07/1998   Patient Active Problem List   Diagnosis Date Noted   Pneumonia of right middle lobe due to  infectious organism 10/07/2023   Leukocytosis 10/07/2023   Ehlers-Danlos syndrome (tentative) 10/03/2023   Hypermobility syndrome 09/11/2023   Bilateral carpal tunnel syndrome 09/11/2023   Flank pain 08/27/2023   Menopausal symptoms 08/27/2023   Attention or concentration deficit 06/17/2023   Fluctuating blood pressure 12/31/2022   Iron  overload 12/17/2022   Adenomatous polyp of ascending colon 06/21/2022   Iron  deficiency anemia due to chronic blood loss 11/20/2021   Generalized postprandial abdominal pain 05/29/2021   Chronic allergic conjunctivitis 02/22/2021   Fibromuscular dysplasia    Aneurysm of right renal artery    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   Aneurysm of splenic artery 02/08/2021   Chronic pain in left shoulder 10/26/2019   PTSD (post-traumatic stress disorder) 06/25/2018   Acute non-recurrent sinusitis 02/07/2017   Chronic pain of left knee 07/09/2016   GAD (generalized anxiety disorder) 06/11/2016   Pruritus 10/20/2015   Chronic back pain 03/21/2015   Decreased hearing of both ears 11/22/2014   Dizziness 10/04/2009   OTHER CHRONIC SINUSITIS 12/12/2008   History of hypothyroidism 09/08/2008   HYPERCHOLESTEROLEMIA 09/08/2008   Depression, major, in remission 09/08/2008   Allergic rhinitis 09/08/2008   GERD 09/08/2008   Peptic ulcer 09/08/2008   Common migraine 09/08/2008    PCP: Avelina Greig BRAVO MD   REFERRING PROVIDER: Dr. Artist Lloyd, MD.  REFERRING DIAG: M54.2 (ICD-10-CM) - Cervicalgia M54.50,G89.29 (ICD-10-CM) - Chronic midline low back pain without sciatica M35.7 (ICD-10-CM) - Hypermobility syndrome  Rationale for Evaluation and Treatment: Rehabilitation  THERAPY DIAG:  No diagnosis found.  ONSET DATE: chronic  SUBJECTIVE:                                                                                                                                                                                           SUBJECTIVE STATEMENT: No pain  at the moment.  I wake up with my ankles stiff, painful in AM.   When I do sidesteps it hurts my outer foot, ankle.  Wears brace at times in the house.  Saw Dr. Joane and cardiologist.  Not taking Metoprolol , showed me her HR variability for today,    This patient has a 53 y.o. 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 In particular, she reports dizziness and leg weakness which is intermittent since a month ago.  Most of her pain is located in her neck and left lower back.  Pain started in L side of her neck.  The pain radiated to the Rt. and even in the back of her base of skull.  She had Rt UE pain as well but that has resolved.  POTS diagnosis is in question.  Patient's condition is complicated by she has had genetic testingFMA.  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 but Dr. Joane does not have the results.  She is followed closely by cardiology and neurologist.  She denies left-sided leg weakness associated with her back pain but does report lower body weakness with periods of walking.  She feels that her Rt UE is weaker- grip/hand.   Patient reports symptoms and/or instability in the following areas: DID NOT FULLY COMPLETE THIS ON EVAL:   - Cervical Spine: [x] Pain [] Instability [x] Limited ROM [] Paresthesia  - Thoracic Spine: [] Pain [] Instability  - Lumbar Spine: [x] Pain [] Instability [] Frequent locking/giving out  - Shoulder: [] L [] R  [] Subluxation [] Dislocations [] Pain [] Fatigue  - Elbow: [] L [] R  [] Instability [] Hyperextension [] Pain  - Wrist/Hand: [] L [] R  [] Instability [] Fatigue with use [] Pain  - Hip: [x] L [] R  [] Instability [] Pain [] Clicking [] Gait deviations  - Knee: [] L [] R  [] Instability [] Hyperextension [] Pain  - Ankle/Foot: [x] L [x] R  [x] Instability [] Frequent sprains Pain   Does the patient have a diagnosis of any of the following: Fatigue[x]  Brain  fog[x]  Lightheadedness[x]  Allergies[]  GI[]  Neurodiverse[x]  ADHD  Additional Notes:  ______________________________________   Patient-reported Beighton Score (if known): ______9_/9  Clinician-assessed Beighton Score (today): _NT______/9    Ehlers-Danlos evaluation is positive with a score of 5.  Patient also has recurrent musculoskeletal pain and widespread pain.   PERTINENT HISTORY:  Pneumonia, fibromyalgia, muscular dysplasia, allergies/sinus, asthma, vascular dissections, new HTN, chest pain,  anemia, anxiety/depression, TIA, hypermobility and Ehlers-Danlos syndrome.   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    Relevant historical information:   MS: joint hypermobility, mild scoliosis Skin/Immune reactions: hyperextensible skin, poor wound healing Nervous system: fatigue, temperature intolerance, dizziness, clumsiness, pre-syncope, migraine Head/Spine: ADHD, slipping ribs CV: palpitations, dissection of the mesenteric artery, aneurism of the renal and splenic arteries, mild regurgitation, tachycardia GI: pain bloating Genitourinary: heavy menstrual cycle in the past Hands & Feet: turn purple at times, piezogenic papules    DDD, Fibromuscular Dysplasia (followed by Rheumatology Dr. 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      PAIN:  Are you having pain? Yes: NPRS scale: none current, was up to 8/10 Pain location: low back , R or L Pain description: tight  Aggravating factors: sitting,   Relieving factors: changing position Ankle pain and stiffness in AM   Are you having pain? Yes: NPRS scale: none today.  Pain location: neck bilateral  Pain description: stiff, tension  Aggravating factors: cold, looking slightly up  Relieving factors: MHP, gets massages  monthly   PRECAUTIONS: Other: cardiac No lifting more than 5 lbs, HR should stay less than 130 bpm  Minimze stretching, twisting     RED FLAGS: PT with current aneurysms    WEIGHT BEARING RESTRICTIONS: No  FALLS:  Has patient fallen in last 6 months? 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  DIAGNOSTIC FINDINGS:  10/23/23 Lumbar MRI IMPRESSION: 1. Small central disc protrusion at L5-S1 without significant stenosis or impingement. 2. Minimal disc bulging and facet hypertrophy at L4-L5 without stenosis or impingement. 3. Fibroid uterus  10/23/23 Cervical IMPRESSION: 1. C4-C5 degenerative changes with disc bulge and bilateral uncovertebral spurring causing mild right C5 foraminal narrowing. No spinal stenosis. 2. C5-C6 degenerative changes with disc space narrowing and mild diffuse disc bulge with left greater than right uncovertebral spurring. No spinal stenosis or foraminal narrowing. 3. Overall, changes are mildly progressed as compared to previous MRI from 2023.   CARDIO/ORTHOSTATICS: Baseline RHR on beta blocker  Standing HR Baseline BP Standing BP   PATIENT SURVEYS:  Modified Oswestry: NT on eval   COGNITION: Overall cognitive status: Within functional limits for tasks assessed     SENSATION: WFL   POSTURE: No Significant postural  limitations  PALPATION: Appropriate tenderness in posterior cervicals, tension in upper traps Stiffness along cervical spinal segments, gentle   LUMBAR ROM:  WFL no pain, ext felt good  AROM eval  Flexion   Extension   Right lateral flexion   Left lateral flexion   Right rotation NT  Left rotation NT    (Blank rows = not tested)  LOWER EXTREMITY ROM:   WNL  Active  Right eval Left eval  Hip flexion  Tighter than RT    Hip  extension    Hip abduction    Hip adduction    Hip internal rotation  Tight   Hip external rotation    Knee flexion    Knee extension +10 +10  Ankle dorsiflexion 0-5 deg 0-5 deg   Ankle plantarflexion 80+ 80+  Ankle inversion hyper hyper  Ankle eversion     (Blank rows = not tested)  LOWER EXTREMITY MMT:    MMT Right eval Left eval  Hip flexion 4+ 4+  Hip extension    Hip abduction    Hip adduction    Hip internal rotation    Hip external rotation    Knee flexion 5 5  Knee extension 5 5  Ankle dorsiflexion 5 5  Ankle plantarflexion    Ankle inversion    Ankle eversion     (Blank rows = not tested)  LUMBAR SPECIAL TESTS:  Neg SLR    CERVICAL ROM:  Limited in all planes due to tension, discomfort, appears fearful    Active ROM A/PROM (deg) 12/15/2023  Flexion 40  Extension 25  Right lateral flexion 30  Left lateral flexion 30  Right rotation 50  Left rotation 50   (Blank rows = not tested)  UE ROM: AROM NT on eval   Passive ROM Right 12/15/2023 Left 12/15/2023  Shoulder flexion    Shoulder extension    Shoulder abduction    Shoulder adduction    Shoulder extension    Shoulder internal rotation hyper hyper  Shoulder external rotation hyper Hyper  Elbow flexion    Elbow extension    Wrist flexion    Wrist extension    Wrist ulnar deviation    Wrist radial deviation    Wrist pronation    Wrist supination     (Blank rows = not tested)  UE MMT:    4-/5 grossly in [facility]    MMT Right 12/15/2023 Left 12/15/2023   Shoulder flexion    Shoulder extension    Shoulder abduction    Shoulder adduction    Shoulder extension    Shoulder internal rotation 5 5  Shoulder external rotation 5 5  Elbow flexion    Elbow extension    Grip strength     (Blank rows = not tested)  CERVICAL SPECIAL TESTS:  NT   FUNCTIONAL TESTS:  SLS WNL Strain in lumbar spine with 90/90 lumbar DNF endurance testing 10 sec    GAIT: Distance walked: 150 Assistive device utilized: None Level of assistance: Complete Independence Comments: no deviations notable   TREATMENT DATE:   [facility] Adult PT Treatment:                                                DATE: 12/15/23 Therapeutic Exercise: *** Manual Therapy: *** Neuromuscular re-ed: *** Therapeutic Activity: *** Modalities: *** Self Care: ***  [facility] Adult PT Treatment:                                                DATE: 12/08/23 Therapeutic Exercise no: Recumbent bike level 1-3 for 6 min , 30 sec intervals added resistance  Step ups 6 inch (runner's step)  Lateral step up 6 inch  Tandem SLS Narrow stance with head turns  and nods Standing palloff press blue band x 10 each side Self Care: Anti rotation Foot and ankle/balance exercises Posture body mechanics, lifting  [facility] Adult PT Treatment:                                                DATE: 12/01/23 Therapeutic Exercise: NuStep L2 for 7 min, HR up to 130 bpm, RPE 9, asked her to slow down  Blue band for row and extension x 15  Abdominal contraction with exhale Bridging x 10 Bridge with clam and sidelying clam with green band  Sidelying shoulder scaption and flexion yellow back for 45 sec  Self care:  Borg Scale/RPE for gauging effort without looking at HR  Structure to HEP for max outcomes  Goal of PT   Cardiac rehab, reviewed with patient info she brought in for FMD  [facility] Adult PT Treatment:                                                DATE: 11/25/23 Therapeutic Exercise: Chin  tuck Horizontal abduction x 15 ER yellow x 15  Flexion/Scaption side-lying yellow band Bridging x 10 Hip abduction x 15  Standing green band row, extension with cueing Therapeutic Activity: Cervical stability Self Care: Intake included self-care regarding ankle stiffness, cervical crepitus, review of guidance regarding FMD and aerobic versus resistance exercises. Reviewed avoidance of twisting with transitional movements  MD note : I have recommended that she focus on aerobic exercise obtaining 30 to 60 minutes at least 5 days a week of moderate intensity aerobic training. She should also encompass some low intensity resistance training (20% of overall training).  We discussed the absolute importance of avoiding lifting to fatigue and avoiding low-repetition, high-intensity resistance exercises (low-rep, max weight).  We discussed avoiding weights that require Valsalva to lift.  I have recommended doing 1 day a week of low intensity resistance training.  This will focus on muscle toning as opposed to muscle building and using machines or bands to control the resistance is optimal.   [facility] Adult PT Treatment:                                                DATE: 11/10/23  Neuromuscular re-ed: Lesleigh tuck Light rotation cervical spine in supine TrA bracing, light with and without pelvic tilt  Self Care: Walking shorter bouts for safety Pain vs strain Degenerative Disc on MRI Flexion vs Ext based exercises  Posture and neck position  PATIENT EDUCATION:  Education details: see above Posture, alignment, neutral zone and joint protection PNE/Explain pain   Joint inflammation/collagen/ligament laxity, muscular support Stretching vs stabilizing  Person educated: Patient Education method: Explanation, Demonstration, and Handouts Education comprehension: verbalized  understanding and needs further education  [email or web address]  The following are key points to remember from this [employer] article on physical activity and exercise in patients with spontaneous coronary artery dissection (SCAD) and fibromuscular dysplasia (FMD):  Patients with SCAD and FMD remain at risk of recurrent vascular dissection in the setting of increased mechanical and shear stress of the arteries. Extreme or unusual exercise has been reported as a trigger for SCAD in up to 29% of patients, although causation has not been proven. Exercise in general is associated with positive effects on vascular function, and as such, limiting exercise among patients with SCAD or FMD due to mechanistic risk should be avoided. With limited available data, moderate aerobic training among SCAD patients is recommended for 30-40 minutes 5-7 days/week. Cardiac rehabilitation should be recommended for all patients. In addition, patients should be counseled to remain active using interval training and low resistance weight training. Activity such as endurance aerobic training, muscle building, and yoga with extreme head and neck positions should be done with caution. Patients should use proper breathing and lifting techniques, avoid Valsalva or straining, and avoid high-intensity exercises in extreme environmental conditions. Patients with carotid or vertebral artery dissections should avoid exercises such as push-ups and sit-ups for 8-12 weeks after the acute dissection after which the recommendations would be similar as for SCAD. There are no data currently to support restricting activities such as riding a roller coaster, sky diving, scuba diving, or sexual intercourse.   HOME EXERCISE PROGRAM:  Access Code: [email or web address] URL: [email or web address] Date: 11/10/2023 Prepared by: Delon Norma  Access Code: [email or web address] URL:  [email or web address] Date: 11/25/2023 Prepared by: Delon Norma  Exercises - Supine Cervical Retraction with Towel  - 1 x daily - 7 x weekly - 2 sets - 10 reps - 5 hold - Seated Cervical Rotation AROM  - 1 x daily - 7 x weekly - 2 sets - 10 reps - Supine Transversus Abdominis Bracing - Hands on Stomach  - 1 x daily - 7 x weekly - 2 sets - 10 reps - 5 hold - Supine Bridge  - 1 x daily - 7 x weekly - 2 sets - 10 reps - 5 hold - Sidelying Hip Abduction  - 1 x daily - 7 x weekly - 2 sets - 10 reps - 5 hold - Standing Shoulder Horizontal Abduction with Resistance  - 1 x daily - 7 x weekly - 2 sets - 10 reps - 5 hold - Shoulder External Rotation and Scapular Retraction with Resistance  - 1 x daily - 7 x weekly - 2 sets - 10 reps - 5 hold - Shoulder extension with resistance - Neutral  - 1 x daily - 7 x weekly - 2 sets - 10 reps - 5 hold - Standing Shoulder Row with Anchored Resistance  - 1 x daily - 7 x weekly - 2 sets - 10 reps - 5 hold - Sidelying Shoulder Abduction with Resistance to 60 Degrees  - 1 x daily - 7 x weekly - 2 sets - 10 reps - 5 hold See med bridge   ASSESSMENT:  CLINICAL IMPRESSION: Patient continues to do quite well overall considering her condition.  She has changed to walking indoors recently and has less dizziness due to level  surface.  If she is outside she still has some lightheadedness and leg weakness when she goes up hills.  At times she feels her ankles are weak so we worked on a couple of standing ankle and balance exercises today.  Her neck pain is mostly when she is in a position of extended periods of extension or lying on her side on the couch.  She has a full exercise program but would benefit from about 1 more visit to review some supine core stabilization in case her lower back becomes an issue in the future.  Patient is a 53y.o. 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  Physical therapy will proceed gently and limit torso rotations but try to instill confidence in being active.   OBJECTIVE IMPAIRMENTS: cardiopulmonary status limiting activity, decreased activity tolerance, decreased endurance, decreased mobility, difficulty walking, decreased ROM, decreased strength, increased fascial restrictions, impaired flexibility, impaired UE functional use, postural dysfunction, and pain.   ACTIVITY LIMITATIONS: carrying, lifting, bending, sitting, standing, squatting, stairs, reach over head, locomotion level, and caring for others  PARTICIPATION LIMITATIONS: meal prep, cleaning, laundry, interpersonal relationship, driving, shopping, and community activity  PERSONAL FACTORS: 3+ comorbidities: FMD- see above/cardiac, joint hypermobility, widespread pain are also affecting patient's functional outcome.   REHAB POTENTIAL: Good  CLINICAL DECISION MAKING: Unstable/unpredictable  EVALUATION COMPLEXITY: High   GOALS: Goals reviewed with patient? Yes  SHORT TERM GOALS: Target date: 12/08/2023  Patient will be able to show independence for initial HEP to include posture, core and hip strength and stability.   Baseline: Goal status: MET   2.  Patient will modify walking in shorter bouts for safety and pain management.   Baseline:  walks most days of the week, 3 miles - up to an hour for this Goal status: ongoing   3.  Patient will complete functional baseline testing and goals set Baseline:  Goal status: MET    LONG TERM GOALS: Target date: 01/05/2024   Patient will be independent with final HEP upon discharge from PT and report consistent benefit following exercise completion.    Baseline:  Goal status: ongoing   2.  Patient will report less neck pain/stiffness reduced by 25% in the morning hours Baseline: - has pain with malpositioning and anxiety Goal status: ongoing   3.  Patient will  be able to demonstrate proper posture and lifting techniques related to spine health and reduction of symptoms Baseline:  Goal status: INITIAL  4.  Modified goal: Patient will self monitor effort, RPE for cardio in clinic.    Baseline: needs cues, given printout today   Goal status: ongoing   5.  Pt will be able to walk for 15 minutes or more without increasing leg weakness, dizziness  Baseline: does not get as dizzy inside, has rolling hills outside,which is more demanding  Goal status: ongoing , multifactorial    PLAN:  PT FREQUENCY: 1x/week  PT DURATION: 8 weeks  PLANNED INTERVENTIONS: [id number]- PT Re-evaluation, 97750- Physical Performance Testing, 97110-Therapeutic exercises, 97530- Therapeutic activity, [id number]- Neuromuscular re-education, 97535- Self Care, [id number]- Manual therapy, Patient/Family education, Balance training, Taping, Cryotherapy, and Moist heat.  PLAN FOR NEXT SESSION:  REVIEW FULL PROGRAM 1 day upper 1 Day lower 5 days cardio   Lower body resistance exercises.  Trial Wall sit, squat bridging calf raises.  breathing and avoid Valsalva, consider recumbent bike versus walking  on treadmill.   Light stabilization cervical spine and lumbar spine, pelvis.   Okay to do light resistance with higher reps.  See above for exercise guidelines   Alaycia Eardley, PT 12/15/2023, 9:12 AM   Delon Norma, PT 12/15/23 9:12 AM Phone: [phone] Fax: [phone]

## 2023-12-16 ENCOUNTER — Ambulatory Visit: Payer: 59 | Admitting: Dermatology

## 2023-12-16 DIAGNOSIS — D239 Other benign neoplasm of skin, unspecified: Secondary | ICD-10-CM

## 2023-12-16 DIAGNOSIS — D229 Melanocytic nevi, unspecified: Secondary | ICD-10-CM

## 2023-12-16 DIAGNOSIS — L821 Other seborrheic keratosis: Secondary | ICD-10-CM

## 2023-12-16 DIAGNOSIS — M898X9 Other specified disorders of bone, unspecified site: Secondary | ICD-10-CM

## 2023-12-16 DIAGNOSIS — L814 Other melanin hyperpigmentation: Secondary | ICD-10-CM

## 2023-12-16 DIAGNOSIS — Z1283 Encounter for screening for malignant neoplasm of skin: Secondary | ICD-10-CM

## 2023-12-16 DIAGNOSIS — L578 Other skin changes due to chronic exposure to nonionizing radiation: Secondary | ICD-10-CM

## 2023-12-16 DIAGNOSIS — D1801 Hemangioma of skin and subcutaneous tissue: Secondary | ICD-10-CM

## 2023-12-16 DIAGNOSIS — L738 Other specified follicular disorders: Secondary | ICD-10-CM

## 2023-12-16 DIAGNOSIS — D485 Neoplasm of uncertain behavior of skin: Secondary | ICD-10-CM

## 2023-12-16 NOTE — Patient Instructions (Signed)

## 2023-12-16 NOTE — Progress Notes (Signed)
 Follow-Up Visit   Subjective  Isabel Foley is a 53 y.o. female who presents for the following: Skin Cancer Screening and Full Body Skin Exam  The patient presents for Total-Body Skin Exam (TBSE) for skin cancer screening and mole check. The patient has spots, moles and lesions to be evaluated, some may be new or changing, including left cheek, left upper arm, left lower leg.   The following portions of the chart were reviewed this encounter and updated as appropriate: medications, allergies, medical history  Review of Systems:  No other skin or systemic complaints except as noted in HPI or Assessment and Plan.  Objective  Well appearing patient in no apparent distress; mood and affect are within normal limits.  A full examination was performed including scalp, head, eyes, ears, nose, lips, neck, chest, axillae, abdomen, back, buttocks, bilateral upper extremities, bilateral lower extremities, hands, feet, fingers, toes, fingernails, and toenails. All findings within normal limits unless otherwise noted below.   Relevant physical exam findings are noted in the Assessment and Plan.  right pretibia 4.0 mm med brown macule darker edge    Assessment & Plan   SKIN CANCER SCREENING PERFORMED TODAY.  ACTINIC DAMAGE - Chronic condition, secondary to cumulative UV/sun exposure - diffuse scaly erythematous macules with underlying dyspigmentation - Recommend daily broad spectrum sunscreen SPF 30+ to sun-exposed areas, reapply every 2 hours as needed.  - Staying in the shade or wearing long sleeves, sun glasses (UVA+UVB protection) and wide brim hats (4-inch brim around the entire circumference of the hat) are also recommended for sun protection.  - Call for new or changing lesions.  LENTIGINES, SEBORRHEIC KERATOSES, HEMANGIOMAS - Benign normal skin lesions Sks - waxy macules R upper eyebroa - Benign-appearing - Call for any changes Discussed cosmetic procedure cryotherapy, noncovered.  $60  for 1st lesion and $15 for each additional lesion if done on the same day.  Maximum charge $350.  One touch-up treatment included no charge. Discussed risks of treatment including dyspigmentation, small scar, and/or recurrence. Recommend daily broad spectrum sunscreen SPF 30+/photoprotection to treated areas once healed.   MELANOCYTIC NEVI - Tan-brown and/or pink-flesh-colored symmetric macules and papules - 3.44mm med dark brown macule left post ankle/lower calf - 2.5 mm med dark brown macule right lower abdomen - 2.5 mm med brown macule spinal lower back - 3 mm flesh papule, darker center at right sideburn - Benign appearing on exam today - Observation - Call clinic for new or changing moles - Recommend daily use of broad spectrum spf 30+ sunscreen to sun-exposed areas.   Sebaceous Hyperplasia - Small yellow papules with a central dell at left mid cheek, forehead - Benign-appearing - Observe. Call for changes.  BONY PROMINENCE vs CYST   Exam: 1.5 cm fixed firm SQ nodule at left forehead, present for years, pt feels like it has grown some.   Treatment: Benign-appearing.  Observation.  Call clinic for new or changing lesions. Discussed referral to plastic surgeon if she wants it removed.  DERMATOFIBROMA,  Exam: Firm pink/brown papulenodule with dimple sign at left upper arm biopsy proven 12/09/32; left lateral thigh  Treatment Plan: A dermatofibroma is a benign growth possibly related to trauma, such as an insect bite, cut from shaving, or inflamed acne-type bump.  Treatment options to remove include shave or excision with resulting scar and risk of recurrence.  Since benign-appearing and not bothersome, will observe for now.   NEOPLASM OF UNCERTAIN BEHAVIOR OF SKIN right pretibia Epidermal / dermal shaving  Lesion diameter (cm):  0.6 Informed consent: discussed and consent obtained   Patient was prepped and draped in usual sterile fashion: Area prepped with alcohol. Anesthesia:  the lesion was anesthetized in a standard fashion   Anesthetic:  1% lidocaine  w/ epinephrine 1-100,000 buffered w/ 8.4% NaHCO3 Instrument used: flexible razor blade   Hemostasis achieved with: pressure, aluminum chloride and electrodesiccation   Outcome: patient tolerated procedure well   Post-procedure details: wound care instructions given   Post-procedure details comment:  Ointment and small bandage applied  Specimen 1 - Surgical pathology Differential Diagnosis: Nevus r/o Dysplasia  Check Margins: Yes Return in about 1 year (around 12/15/2024) for TBSE.  IAndrea Kerns, CMA, am acting as scribe for Rexene Rattler, MD .   Documentation: I have reviewed the above documentation for accuracy and completeness, and I agree with the above.  Rexene Rattler, MD

## 2023-12-17 ENCOUNTER — Encounter: Payer: Self-pay | Admitting: Psychology

## 2023-12-17 ENCOUNTER — Ambulatory Visit: Admitting: Psychology

## 2023-12-17 DIAGNOSIS — F902 Attention-deficit hyperactivity disorder, combined type: Secondary | ICD-10-CM

## 2023-12-17 DIAGNOSIS — F4310 Post-traumatic stress disorder, unspecified: Secondary | ICD-10-CM

## 2023-12-17 DIAGNOSIS — F411 Generalized anxiety disorder: Secondary | ICD-10-CM

## 2023-12-17 NOTE — Progress Notes (Signed)
° °    [facility] Counselor/Therapist Progress Note  Patient ID: Isabel Foley, MRN: [id number],    Date: 12/17/2023  Time Spent: 1:30pm-2:42pm   Pt is seen for a virtual video visit via caregility.  Pt joins from her home, reporting privacy, and counselor from her home office.  Pt consents to virtual visit and is aware of limitations of such visits.    Treatment Type: Individual Therapy  Reported Symptoms: pt reports continued family stressors and worry for kids.  Pt reports managing anxiety w/ daughter being stationed over seas.  Mental Status Exam: Appearance:  Well Groomed     Behavior: Appropriate  Motor: Normal  Speech/Language:  Clear and Coherent and Normal Rate  Affect: Appropriate  Mood: anxious and worry  Thought process: normal  Thought content:   WNL  Sensory/Perceptual disturbances:   WNL  Orientation: oriented to person, place, time/date, and situation  Attention: Good  Concentration: Good  Memory: WNL  Fund of knowledge:  Good  Insight:   Good  Judgment:  Good  Impulse Control: Good   Risk Assessment: Danger to Self:  No Self-injurious Behavior: No Danger to Others: No Duty to Warn:no Physical Aggression / Violence:No  Access to Firearms a concern: No  Gang Involvement:No   Subjective: Counselor assessed pt current functioning per pt report.  Processed w/ pt  stressors and anxiety.  Explored anxiety and worries and use of coping skills.  Assisted pt w/ recognizing worried thought patterns and reframing.  Discussed ways to be support to son and keep open communication.  Reviewed and updated tx plan.    Pt affect wnl.  Pt reports feeling continued stress w/ daughter leaving for [place] and w/ son continuing relationship she feels is unhealthy.  Pt reports on positive of coping skills used and continuing w/ mindful breath practice.  Pt discussed goals and provided consent for tx plan.  Interventions: Cognitive Behavioral Therapy, Mindfulness  Meditation, and supportive  Diagnosis:GAD (generalized anxiety disorder)  PTSD (post-traumatic stress disorder)  Attention deficit hyperactivity disorder (ADHD), combined type, moderate  Plan: Pt to f/u in 2 weeks w/ counseling.  Pt to f/u as scheduled w/ PCP and specialists. Practice mindfulness and grounding skills daily.       Riven Mabile, [facility]               [place], LCMHC

## 2023-12-17 NOTE — Progress Notes (Signed)
°  Behavioral Health Treatment Plan   Name:Isabel Foley   MRN: [id number]   Treatment Plan Development Date: 12/17/23   Strengths: Friends, Church, Spirituality, and Able to [employer] enjoys travel w/ family  Supports: Spouse and Friends   Theatre Manager of Needs: Continue to learn how to coping with anxiety/worry/panic and become more organized so don't feel so overwhelmed.   Treatment Level:outpt counseling  Client Treatment Preferences:biweekly to monthly counselor virutal.  Continue w/ her PCP for medication management.    Diagnosis: ADHD  ADHD  Symptoms:  Often has difficulty organizing tasks and activities (e.g., difficulty managing sequential tasks; difficulty keeping materials and belongings in order; messy, disorganized work; has poor time management; fails to meet deadlines). and Is often easily distracted by extraneous stimuli (for older adolescents and adults, may include unrelated thoughts).  Goals:  Develop balance and day-to day functioning across all settings.  Objectives: Target Date For All Objectives: 12/16/24  Learn and consistently employ the necessary tools and skills to manage ADHD symptoms including creating the use of reminders, planners, organizational skills.  Progress Documentation:  Progressing  Interventions:  Organization/time management/problem solving skills, Psychoeducation regarding diagnoses and treatment., CBT - reframing, challenging, cognitive restructuring., and Behavioral Activation.  , Diagnosis: Anxiety  GAD  Symptoms:  Excessive and/or unrealistic worry that is difficult to control occurring more days than not for at least 6 months about a number of events or activities, Difficulty managing worry, Restlessness or feeling keyed up or on edge, and Being easily fatigued  Goals:  Improve daily functioning by reducing overall anxiety symptoms including intensity, frequency, and duration of  symptoms.  Objectives: Target Date For All Objectives: 12/16/24  Develop coping tools to manage anxiety including mindfulness, acceptance, relaxation, reframing, and challenging negative thoughts and feelings., Identify, challenge, and manage negative self-talk, negative thinking about self and others, and maintain mindfulness regarding self-talk and cognitive distortions., Develop consistent self-care routine including exercise, healthful eating, consistent sleep., and Identify contributing factors to current anxiety including family of origin issues, past trauma, significant stressors, and negative cognitions.  Progress Documentation:  Progressing  Interventions:  CBT - reframing, challenging, cognitive restructuring, DBT, ACT, Relaxation and mindfulness, Distress Tolerance, Communication skills - conflict resolution, Self-Care - exercise, sleep, nutrition, and Strength-based    The patient and clinician reviewed the treatment plan on 12/17/2023. The patient provided verbal consent for the treatment plan and the consent for with electronic signature is on file in the patients electronic medical record.   Expected duration of treatment: 1 year and reevaluate  Party responsible for implementation of interventions: pt and counselor.   This plan has been reviewed and created by the following participants: pt and counselor   A new plan will be created at least every 12 months.  The patient fully participated in the development of treatment plan with the clinician and verbally consents to such treatment.   Patient Treatment Plan Signature Obtained: Yes, please see patient chart for sign off.   Theone Bowell, [facility]        [place], LCMHC

## 2023-12-19 ENCOUNTER — Telehealth: Payer: Self-pay | Admitting: Family Medicine

## 2023-12-19 LAB — SURGICAL PATHOLOGY

## 2023-12-19 NOTE — Telephone Encounter (Signed)
 Pt states Dr. Joane discussed the [employer] connective tissue panel. She is willing to do this and has contacted her insurance. Insurance will need prior auth, pt has met her out of pocket and would like to do this before the end of the year if possible.  Please contact pt to advise how to proceed.

## 2023-12-22 ENCOUNTER — Ambulatory Visit: Payer: Self-pay | Admitting: Dermatology

## 2023-12-22 NOTE — Telephone Encounter (Signed)
 Prior Auth submitted to [employer] via [facility] Portal.

## 2023-12-22 NOTE — Telephone Encounter (Signed)
 Advised pt of bx results/sh ?

## 2023-12-22 NOTE — Telephone Encounter (Signed)
-----   Message from Rexene Rattler, MD sent at 12/22/2023  9:50 AM EST ----- 1. Skin, right pretibia :       MELANOCYTIC NEVUS, JUNCTIONAL LENTIGINOUS TYPE, CLOSE TO MARGIN   Benign mole, no further treatment needed - please call patient

## 2023-12-23 ENCOUNTER — Encounter: Payer: Self-pay | Admitting: Family Medicine

## 2023-12-23 ENCOUNTER — Encounter: Payer: Self-pay | Admitting: Dermatology

## 2023-12-24 MED ORDER — MUPIROCIN 2 % EX OINT: 1.0000 | TOPICAL_OINTMENT | Freq: Two times a day (BID) | CUTANEOUS | 0 refills | Status: AC

## 2023-12-24 NOTE — Telephone Encounter (Signed)
 Per ClorindaBETHA Delon Croak 12/23/2023 9:21 PM Hello Leotis, thank you for reaching out. I've forwarded this correspondence to the Prior Authorization Representative for review. Best, Delon POUR - Billing

## 2023-12-31 ENCOUNTER — Ambulatory Visit (INDEPENDENT_AMBULATORY_CARE_PROVIDER_SITE_OTHER): Admitting: Primary Care

## 2023-12-31 ENCOUNTER — Encounter: Payer: Self-pay | Admitting: Primary Care

## 2023-12-31 VITALS — BP 112/74 | HR 89 | Temp 98.0°F | Ht 61.0 in | Wt 123.0 lb

## 2023-12-31 DIAGNOSIS — J019 Acute sinusitis, unspecified: Secondary | ICD-10-CM

## 2023-12-31 MED ORDER — AMOXICILLIN-POT CLAVULANATE 875-125 MG PO TABS: 1.0000 | ORAL_TABLET | Freq: Two times a day (BID) | ORAL | 0 refills | Status: AC

## 2023-12-31 MED ORDER — BENZONATATE 200 MG PO CAPS: 200.0000 mg | ORAL_CAPSULE | Freq: Three times a day (TID) | ORAL | 0 refills | Status: AC | PRN

## 2023-12-31 NOTE — Patient Instructions (Signed)
 Start Augmentin  antibiotics. Take 1 tablet by mouth twice daily for 7 days.  Start Benzonatate  capsules for cough. Take 1 capsule by mouth three times daily as needed for cough.  It was a pleasure meeting you!

## 2023-12-31 NOTE — Assessment & Plan Note (Signed)
 Given duration of symptoms, coupled with history, will treat for presumed bacterial involvement.  Start Augmentin  antibiotics. Take 1 tablet by mouth twice daily for 10 days. Start Benzonatate  capsules for cough. Take 1 capsule by mouth three times daily as needed for cough.  Consider chest x-ray if symptoms persist. Return precautions provided

## 2023-12-31 NOTE — Progress Notes (Signed)
 "  Subjective:    Patient ID: Isabel Foley, female    DOB: 04-11-1970, 53 y.o.   MRN: [id number]  Isabel Foley is a very pleasant 53 y.o. female patient of Dr. Avelina with a history of migraines, right renal artery aneurysm, splenic artery aneurysm, chronic allergic rhinitis who presents today to discuss sinus pressure.  She was last treated for sinusitis in September 2025 with azithromycin  course per the ED.  She was then evaluated by her PCP shortly after who prescribed amoxicillin  for ongoing.  Today she discusses a one month history of increased head congestion and right sided facial pressure. A few days ago she developed a sore throat and congested cough, and low grade fevers (t-max 100.0). she has a history of sinusitis and these symptoms feel similar. She also has a history of pneumonia and is followed by pulmonology.   Review of Systems  Constitutional:  Positive for chills, fatigue and fever.  HENT:  Positive for congestion, sinus pressure, sinus pain and sore throat.   Respiratory:  Positive for cough. Negative for shortness of breath.   Neurological:  Positive for headaches.         Past Medical History:  Diagnosis Date   Abdominal pain, right upper quadrant    Allergic rhinitis, cause unspecified    Allergy-induced asthma    Anemia    Aneurysm of right renal artery    Anxiety    Arthritis    Depressive disorder, not elsewhere classified    Dizziness and giddiness    Esophageal reflux    Fibromuscular dysplasia    Gallstones    Headache    Liver lesion    Nontoxic multinodular goiter    Other chronic sinusitis    Other malaise and fatigue    Pain in limb    Peptic ulcer, unspecified site, unspecified as acute or chronic, without mention of hemorrhage, perforation, or obstruction    Premenstrual tension syndromes    Pure hypercholesterolemia    TIA (transient ischemic attack)    Unspecified hypothyroidism     Social History   Socioeconomic History    Marital status: Married    Spouse name: Not on file   Number of children: 7   Years of education: Not on file   Highest education level: 12th grade  Occupational History   Occupation: Stay at home mom  Tobacco Use   Smoking status: Never   Smokeless tobacco: Never  Vaping Use   Vaping status: Never Used  Substance and Sexual Activity   Alcohol use: No   Drug use: No   Sexual activity: Yes    Birth control/protection: Surgical    Comment: husband with vasectomy  Other Topics Concern   Not on file  Social History Narrative   7 children; 6 biologic.SABRASABRAADHD      Married; previously divorced      Stay at home mom      Regular exercise-yes, daily aerobics, treadmill      Diet: Skips meals, limited veggies; ice cream in AM   Social Drivers of Health   Tobacco Use: Low Risk (12/31/2023)   Patient History    Smoking Tobacco Use: Never    Smokeless Tobacco Use: Never    Passive Exposure: Not on file  Financial Resource Strain: Patient Declined (12/30/2023)   Overall Financial Resource Strain (CARDIA)    Difficulty of Paying Living Expenses: Patient declined  Food Insecurity: Patient Declined (12/30/2023)   Epic    Worried About [employer] of  Food in the Last Year: Patient declined    Ran Out of Food in the Last Year: Patient declined  Transportation Needs: No Transportation Needs (12/30/2023)   Epic    Lack of Transportation (Medical): No    Lack of Transportation (Non-Medical): No  Physical Activity: Unknown (12/30/2023)   Exercise Vital Sign    Days of Exercise per Week: 5 days    Minutes of Exercise per Session: Not on file  Stress: No Stress Concern Present (12/30/2023)   [employer] of Occupational Health - Occupational Stress Questionnaire    Feeling of Stress: Only a little  Social Connections: Moderately Isolated (12/30/2023)   Social Connection and Isolation Panel    Frequency of Communication with Friends and Family: Once a week    Frequency of Social  Gatherings with Friends and Family: Once a week    Attends Religious Services: More than 4 times per year    Active Member of [employer] or Organizations: No    Attends Engineer, Structural: Not on file    Marital Status: Married  Catering Manager Violence: Not on file  Depression (PHQ2-9): Medium Risk (12/31/2023)   Depression (PHQ2-9)    PHQ-2 Score: 10  Alcohol Screen: Low Risk (12/30/2023)   Alcohol Screen    Last Alcohol Screening Score (AUDIT): 1  Housing: Low Risk (12/30/2023)   Epic    Unable to Pay for Housing in the Last Year: No    Number of Times Moved in the Last Year: 0    Homeless in the Last Year: No  Utilities: Not At Risk (11/24/2023)   Received from [facility]   Epic    In the past 12 months has the electric, gas, oil, or water company threatened to shut off services in your home?: No  Health Literacy: Not on file    Past Surgical History:  Procedure Laterality Date   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     with 2 stents   BREAST BIOPSY Right 06/01/2015   stereo, path pending   COLONOSCOPY WITH PROPOFOL  N/A 06/21/2022   Procedure: COLONOSCOPY WITH PROPOFOL ;  Surgeon: Unk Corinn Skiff, MD;  Location: [facility] ENDOSCOPY;  Service: Gastroenterology;  Laterality: N/A;   DILATION AND CURETTAGE OF UTERUS  01/08/2000   DILITATION & CURRETTAGE/HYSTROSCOPY WITH NOVASURE ABLATION N/A 12/06/2021   Procedure: DILATATION & CURETTAGE/HYSTEROSCOPY WITH NOVASURE ABLATION;  Surgeon: Estelle Service, MD;  Location: [facility] OR;  Service: Gynecology;  Laterality: N/A;   ESOPHAGOGASTRODUODENOSCOPY (EGD) WITH PROPOFOL  N/A 06/21/2022   Procedure: ESOPHAGOGASTRODUODENOSCOPY (EGD) WITH PROPOFOL ;  Surgeon: Unk Corinn Skiff, MD;  Location: [facility] ENDOSCOPY;  Service: Gastroenterology;  Laterality: N/A;   LEEP  01/07/1998    Family History  Problem Relation Age of Onset   Anxiety disorder Mother    Hyperthyroidism Mother    Irritable bowel syndrome  Mother    Anxiety disorder Father    Colon polyps Father    Hypertension Father    Hypothyroidism Father    Anxiety disorder Sister    Other Sister        Collagen Vascular Disease   Anxiety disorder Brother    Heart disease Brother    Atrial fibrillation Brother 20   Breast cancer Cousin    ADD / ADHD Son    Anxiety disorder Son    ADD / ADHD Son    Anxiety disorder Daughter    ADD / ADHD Daughter     Allergies[1]  Medications Ordered Prior to Encounter[2]  BP 112/74   Pulse 89   Temp 98 F (36.7 C) (Oral)   Ht 5' 1 (1.549 m)   Wt 123 lb (55.8 kg)   SpO2 96%   BMI 23.24 kg/m  Objective:   Physical Exam Constitutional:      Appearance: She is not ill-appearing.  HENT:     Right Ear: Tympanic membrane and ear canal normal.     Left Ear: Tympanic membrane and ear canal normal.     Nose: No mucosal edema.     Right Sinus: No maxillary sinus tenderness or frontal sinus tenderness.     Left Sinus: No maxillary sinus tenderness or frontal sinus tenderness.     Mouth/Throat:     Mouth: Mucous membranes are moist.  Eyes:     Conjunctiva/sclera: Conjunctivae normal.  Cardiovascular:     Rate and Rhythm: Normal rate and regular rhythm.  Pulmonary:     Effort: Pulmonary effort is normal.     Breath sounds: Normal breath sounds. No wheezing or rhonchi.  Musculoskeletal:     Cervical back: Neck supple.  Skin:    General: Skin is warm and dry.     Physical Exam        Assessment & Plan:  Acute non-recurrent sinusitis, unspecified location Assessment & Plan: Given duration of symptoms, coupled with history, will treat for presumed bacterial involvement.  Start Augmentin  antibiotics. Take 1 tablet by mouth twice daily for 10 days. Start Benzonatate  capsules for cough. Take 1 capsule by mouth three times daily as needed for cough.  Consider chest x-ray if symptoms persist. Return precautions provided  Orders: -     Benzonatate ; Take 1 capsule (200 mg total)  by mouth 3 (three) times daily as needed for cough.  Dispense: 15 capsule; Refill: 0 -     Amoxicillin -Pot Clavulanate; Take 1 tablet by mouth 2 (two) times daily.  Dispense: 20 tablet; Refill: 0    Assessment and Plan Assessment & Plan         Comer MARLA Gaskins, NP       [1]  Allergies Allergen Reactions   Molds & Smuts Shortness Of Breath and Other (See Comments)    wheezing   Ciprofloxacin  Other (See Comments)    Cx due to dissection can increase risk    Levaquin  [Levofloxacin ] Other (See Comments)    Cx due to dissection can increase risk    Nsaids     FMD  [2]  Current Outpatient Medications on File Prior to Visit  Medication Sig Dispense Refill   acetaminophen  (TYLENOL ) 500 MG tablet Take 1,000 mg by mouth every 6 (six) hours as needed for moderate pain.     ALPRAZolam  (XANAX ) 0.25 MG tablet Take 1-2 tablets (0.25-0.5 mg total) by mouth daily as needed (BP > 140/90). 30 tablet 0   ALPRAZolam  (XANAX ) 0.5 MG tablet CAN USE 1 TAB DAILY AS NEEDED FOR ANXIETY ON LONG TRIPS. 20 tablet 0   apixaban (ELIQUIS) 5 MG TABS tablet Take 5 mg by mouth 2 (two) times daily.     aspirin  81 MG chewable tablet Chew 81 mg by mouth daily.     atomoxetine  (STRATTERA ) 40 MG capsule Take 1 capsule (40 mg total) by mouth daily. 90 capsule 0   azelastine (OPTIVAR) 0.05 % ophthalmic solution Place 1 drop into both eyes 2 (two) times daily as needed (allergies).     cetirizine (ZYRTEC) 10 MG tablet Take 10 mg by mouth daily.     Cholecalciferol (  DIALYVITE VITAMIN D  5000) 125 MCG (5000 UT) capsule Take 5,000 Units by mouth daily.     COLLAGEN PO Take 1 Scoop by mouth daily.     cyanocobalamin  (VITAMIN B12) 1000 MCG tablet Take 1,000 mcg by mouth.     cyclobenzaprine  (FLEXERIL ) 5 MG tablet Take 5 mg by mouth 3 (three) times daily as needed for muscle spasms.     diphenhydrAMINE (BENADRYL) 25 MG tablet Take 25 mg by mouth at bedtime as needed for itching.     fexofenadine (ALLEGRA) 180 MG  tablet Take 180 mg by mouth daily as needed for allergies.  5   fluticasone  (FLONASE) 50 MCG/ACT nasal spray Place 2 sprays into both nostrils daily as needed for allergies.     hydrocortisone cream 1 % Apply 1 Application topically 2 (two) times daily as needed for itching.     ipratropium (ATROVENT) 0.06 % nasal spray Place into both nostrils.     levalbuterol (XOPENEX HFA) 45 MCG/ACT inhaler Inhale 1 puff into the lungs every 4 (four) hours as needed for wheezing.     metoprolol  succinate (TOPROL -XL) 25 MG 24 hr tablet Take 50 mg by mouth daily. (Patient taking differently: Take 50 mg by mouth daily. Taking 25 mg)     metroNIDAZOLE  (METROCREAM ) 0.75 % cream Apply topically 2 (two) times daily.     mometasone  (ELOCON ) 0.1 % cream APPLY TO AFFECTED AREAS RASH ON BODY ONCE TO TWICE DAILY AS NEEDED. AVOID APPLYING TO FACE, GROIN, AND AXILLA. USE AS DIRECTED. LONG-TERM USE CAN CAUSE THINNING OF THE SKIN. 45 g 1   montelukast (SINGULAIR) 10 MG tablet Take 10 mg by mouth at bedtime.     Multiple Vitamin (MULTIVITAMIN) tablet Take 1 tablet by mouth daily.     Multiple Vitamins-Minerals (HAIR SKIN & NAILS PO) Take by mouth.     mupirocin  ointment (BACTROBAN ) 2 % Apply 1 Application topically 2 (two) times daily. Apply to healing wound BID. 22 g 0   PREBIOTIC PRODUCT PO Take 1 capsule by mouth daily.     Probiotic Product (PROBIOTIC FORMULA PO) Take 1 tablet by mouth daily.     Ubrogepant  (UBRELVY ) 100 MG TABS Take 1 tablet (100 mg total) by mouth daily as needed (For Migraines). Can repeat dose after 72 hours 16 tablet 0   No current facility-administered medications on file prior to visit.   "

## 2024-01-05 ENCOUNTER — Other Ambulatory Visit: Payer: Self-pay | Admitting: Family Medicine

## 2024-01-05 DIAGNOSIS — Z1231 Encounter for screening mammogram for malignant neoplasm of breast: Secondary | ICD-10-CM

## 2024-01-09 ENCOUNTER — Telehealth: Payer: Self-pay | Admitting: *Deleted

## 2024-01-09 ENCOUNTER — Other Ambulatory Visit: Payer: Self-pay | Admitting: Family Medicine

## 2024-01-09 DIAGNOSIS — D500 Iron deficiency anemia secondary to blood loss (chronic): Secondary | ICD-10-CM

## 2024-01-09 NOTE — Telephone Encounter (Signed)
 Copied from CRM #[id number]. Topic: Clinical - Request for Lab/Test Order >> Jan 09, 2024  9:28 AM Isabel Foley wrote: Reason for CRM: Patient would like to have labs completed prior to upcoming 6 month follow up scheduled for 02/27/24.   CB#[phone]

## 2024-01-09 NOTE — Telephone Encounter (Signed)
 Please call and schedule lab appointment prior her appointment on 02/27/24.  Future orders in Epic.    Dr. Avelina.  I placed order for CBC w/Diff and Iron /Ferritin.  Please adivse if other labs are needed.

## 2024-01-12 ENCOUNTER — Telehealth: Payer: Self-pay

## 2024-01-12 ENCOUNTER — Other Ambulatory Visit (HOSPITAL_COMMUNITY): Payer: Self-pay

## 2024-01-12 NOTE — Telephone Encounter (Signed)
 Pharmacy Patient Advocate Encounter   Received notification from Onbase that prior authorization for Ubrelvy  100 tabs is required/requested.   Insurance verification completed.   The patient is insured through [employer].   Per test claim: PA required; PA submitted to above mentioned insurance via Latent Key/confirmation #[id number] Status is pending

## 2024-01-19 ENCOUNTER — Ambulatory Visit: Admitting: Psychology

## 2024-01-19 ENCOUNTER — Encounter: Payer: Self-pay | Admitting: Physical Therapy

## 2024-01-20 ENCOUNTER — Telehealth: Payer: Self-pay | Admitting: Physical Therapy

## 2024-01-20 ENCOUNTER — Encounter: Admitting: Physical Therapy

## 2024-01-20 NOTE — Telephone Encounter (Signed)
 Patient was contacted today regarding her appointment this afternoon for physical therapy.  She reports she has been under the weather and had significant sinus infection recently.  She is noticing increased soreness in her middle and lower back.  When she was last here on December 1 she was at the point where she could transition to home exercise program.  I called her today to see how to proceed forward.  Coming into PT today would have been a full reevaluation due to the length of time between appointments.  We decided to cancel today's appointment, have her return to her usual activity at her own pace and follow-up with Dr. Joane next month.  At that time if she is still having issues with joint pain and muscle soreness, he can have a new referral sent to our location for another PT episode. She may also benefit from cardiac rehab to address ongoing cardiac and heart rate issues.  I will close out this chart essentially discharging this episode for PT but she can absolutely return at a later date.   Delon Norma, PT 01/20/2024 9:06 AM Phone: [phone] Fax: [phone]

## 2024-01-21 ENCOUNTER — Ambulatory Visit: Admitting: Psychology

## 2024-01-21 DIAGNOSIS — F411 Generalized anxiety disorder: Secondary | ICD-10-CM

## 2024-01-21 DIAGNOSIS — F4310 Post-traumatic stress disorder, unspecified: Secondary | ICD-10-CM

## 2024-01-21 DIAGNOSIS — F902 Attention-deficit hyperactivity disorder, combined type: Secondary | ICD-10-CM

## 2024-01-21 NOTE — Telephone Encounter (Signed)
 Results received, placed on Dr. Virgilio desk to review.

## 2024-01-21 NOTE — Progress Notes (Signed)
" ° °  Behavioral Health Treatment Plan   Name:Isabel Foley   MRN: [id number]   Treatment Plan Development Date: 12/17/23   Strengths: Friends, Church, Spirituality, and Able to [employer] enjoys travel w/ family  Supports: Spouse and Friends   Theatre Manager of Needs: Continue to learn how to coping with anxiety/worry/panic and become more organized so don't feel so overwhelmed.   Treatment Level:outpt counseling  Client Treatment Preferences:biweekly to monthly counselor virutal.  Continue w/ her PCP for medication management.    Diagnosis: ADHD  ADHD  Symptoms:  Often has difficulty organizing tasks and activities (e.g., difficulty managing sequential tasks; difficulty keeping materials and belongings in order; messy, disorganized work; has poor time management; fails to meet deadlines). and Is often easily distracted by extraneous stimuli (for older adolescents and adults, may include unrelated thoughts).  Goals:  Develop balance and day-to day functioning across all settings.  Objectives: Target Date For All Objectives: 12/16/24  Learn and consistently employ the necessary tools and skills to manage ADHD symptoms including creating the use of reminders, planners, organizational skills.  Progress Documentation:  Progressing  Interventions:  Organization/time management/problem solving skills, Psychoeducation regarding diagnoses and treatment., CBT - reframing, challenging, cognitive restructuring., and Behavioral Activation.  , Diagnosis: Anxiety  GAD  Symptoms:  Excessive and/or unrealistic worry that is difficult to control occurring more days than not for at least 6 months about a number of events or activities, Difficulty managing worry, Restlessness or feeling keyed up or on edge, and Being easily fatigued  Goals:  Improve daily functioning by reducing overall anxiety symptoms including intensity, frequency, and duration of  symptoms.  Objectives: Target Date For All Objectives: 12/16/24  Develop coping tools to manage anxiety including mindfulness, acceptance, relaxation, reframing, and challenging negative thoughts and feelings., Identify, challenge, and manage negative self-talk, negative thinking about self and others, and maintain mindfulness regarding self-talk and cognitive distortions., Develop consistent self-care routine including exercise, healthful eating, consistent sleep., and Identify contributing factors to current anxiety including family of origin issues, past trauma, significant stressors, and negative cognitions.  Progress Documentation:  Progressing  Interventions:  CBT - reframing, challenging, cognitive restructuring, DBT, ACT, Relaxation and mindfulness, Distress Tolerance, Communication skills - conflict resolution, Self-Care - exercise, sleep, nutrition, and Strength-based    The patient and clinician reviewed the treatment plan on 01/21/2024. The patient provided verbal consent for the treatment plan and the consent for with electronic signature is on file in the patients electronic medical record.   Expected duration of treatment: 1 year and reevaluate  Party responsible for implementation of interventions: pt and counselor.   This plan has been reviewed and created by the following participants: pt and counselor   A new plan will be created at least every 12 months.  The patient fully participated in the development of treatment plan with the clinician and verbally consents to such treatment.   Patient Treatment Plan Signature Obtained: Yes, please see patient chart for sign off.   Stillman Buenger, LCMHC        Isabel Foley, LCMHC              Isabel Foley, LCMHC "

## 2024-01-21 NOTE — Progress Notes (Signed)
" ° °    [facility] Counselor/Therapist Progress Note  Patient ID: Isabel Foley, MRN: [id number],    Date: 01/21/2024  Time Spent: 12:01pm-1:02pm   Pt is seen for a virtual video visit via caregility.  Pt joins from her home, reporting privacy, and counselor from her home office.  Pt consents to virtual visit and is aware of limitations of such visits.    Treatment Type: Individual Therapy  Reported Symptoms: pt reports family stressors and her worry.  Pt reports focus on what's in her control and ways she can and cannot support.   Mental Status Exam: Appearance:  Well Groomed     Behavior: Appropriate  Motor: Normal  Speech/Language:  Clear and Coherent and Normal Rate  Affect: Appropriate  Mood: anxious and worry  Thought process: normal  Thought content:   WNL  Sensory/Perceptual disturbances:   WNL  Orientation: oriented to person, place, time/date, and situation  Attention: Good  Concentration: Good  Memory: WNL  Fund of knowledge:  Good  Insight:   Good  Judgment:  Good  Impulse Control: Good   Risk Assessment: Danger to Self:  No Self-injurious Behavior: No Danger to Others: No Duty to Warn:no Physical Aggression / Violence:No  Access to Firearms a concern: No  Gang Involvement:No   Subjective: Counselor assessed pt current functioning per pt report.  Processed w/ pt  family stressors and anxiety.  Explored w/ pt worried thoughts and assisted pt in focus on recognizing ways she is supporting and acknowledging what she cannot control. Encouraged continued support and open communication and use of her mindful breathing.   Pt affect wnl.  Pt reports busy w/ doctor's appointments.  Pt reports that currently heat monitor to track hear rate as continues to have spikes when not doing anything.  Pt reports that continued stressors for son and his decisions and signs of unhealthy relationship.  Pt discussed her daughter transition to [place] and concerns that not  engaging w/ others.  Pt reports her ex visited oldest at Christmas and rejection of her son continues.  Pt reports she is reminding self of what she can do to be support, not taking on responsibility for fixing things she can't change.  Pt reports some positives w/ interactions w/ her son recent despite his guardedness.   Interventions: Cognitive Behavioral Therapy, Mindfulness Meditation, and supportive  Diagnosis:GAD (generalized anxiety disorder)  PTSD (post-traumatic stress disorder)  Attention deficit hyperactivity disorder (ADHD), combined type, moderate  Plan: Pt to f/u in 2-3 weeks w/ counseling.  Pt to f/u as scheduled w/ PCP and specialists. Practice mindfulness and grounding skills daily.          [name] APPL, [facility] "

## 2024-01-21 NOTE — Telephone Encounter (Signed)
 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  Is hard to know if these are relevant or not.  On your return visit on February 2 will look over the test results and what that mutation could produce and see if that makes any sense that it would cause any disease or problems for you.

## 2024-02-09 ENCOUNTER — Ambulatory Visit: Admitting: Family Medicine

## 2024-02-10 ENCOUNTER — Ambulatory Visit: Admitting: Psychology

## 2024-02-10 DIAGNOSIS — F902 Attention-deficit hyperactivity disorder, combined type: Secondary | ICD-10-CM

## 2024-02-10 DIAGNOSIS — F4310 Post-traumatic stress disorder, unspecified: Secondary | ICD-10-CM | POA: Diagnosis not present

## 2024-02-10 DIAGNOSIS — F411 Generalized anxiety disorder: Secondary | ICD-10-CM | POA: Diagnosis not present

## 2024-02-10 NOTE — Progress Notes (Signed)
" ° °  Behavioral Health Treatment Plan   Name:Isabel Foley   MRN: [id number]   Treatment Plan Development Date: 12/17/23   Strengths: Friends, Church, Spirituality, and Able to [employer] enjoys travel w/ family  Supports: Spouse and Friends   Theatre Manager of Needs: Continue to learn how to coping with anxiety/worry/panic and become more organized so don't feel so overwhelmed.   Treatment Level:outpt counseling  Client Treatment Preferences:biweekly to monthly counselor virutal.  Continue w/ her PCP for medication management.    Diagnosis: ADHD  ADHD  Symptoms:  Often has difficulty organizing tasks and activities (e.g., difficulty managing sequential tasks; difficulty keeping materials and belongings in order; messy, disorganized work; has poor time management; fails to meet deadlines). and Is often easily distracted by extraneous stimuli (for older adolescents and adults, may include unrelated thoughts).  Goals:  Develop balance and day-to day functioning across all settings.  Objectives: Target Date For All Objectives: 12/16/24  Learn and consistently employ the necessary tools and skills to manage ADHD symptoms including creating the use of reminders, planners, organizational skills.  Progress Documentation:  Progressing  Interventions:  Organization/time management/problem solving skills, Psychoeducation regarding diagnoses and treatment., CBT - reframing, challenging, cognitive restructuring., and Behavioral Activation.  , Diagnosis: Anxiety  GAD  Symptoms:  Excessive and/or unrealistic worry that is difficult to control occurring more days than not for at least 6 months about a number of events or activities, Difficulty managing worry, Restlessness or feeling keyed up or on edge, and Being easily fatigued  Goals:  Improve daily functioning by reducing overall anxiety symptoms including intensity, frequency, and duration of  symptoms.  Objectives: Target Date For All Objectives: 12/16/24  Develop coping tools to manage anxiety including mindfulness, acceptance, relaxation, reframing, and challenging negative thoughts and feelings., Identify, challenge, and manage negative self-talk, negative thinking about self and others, and maintain mindfulness regarding self-talk and cognitive distortions., Develop consistent self-care routine including exercise, healthful eating, consistent sleep., and Identify contributing factors to current anxiety including family of origin issues, past trauma, significant stressors, and negative cognitions.  Progress Documentation:  Progressing  Interventions:  CBT - reframing, challenging, cognitive restructuring, DBT, ACT, Relaxation and mindfulness, Distress Tolerance, Communication skills - conflict resolution, Self-Care - exercise, sleep, nutrition, and Strength-based    The patient and clinician reviewed the treatment plan on 02/10/2024. The patient provided verbal consent for the treatment plan and the consent for with electronic signature is on file in the patients electronic medical record.   Expected duration of treatment: 1 year and reevaluate  Party responsible for implementation of interventions: pt and counselor.   This plan has been reviewed and created by the following participants: pt and counselor   A new plan will be created at least every 12 months.  The patient fully participated in the development of treatment plan with the clinician and verbally consents to such treatment.   Patient Treatment Plan Signature Obtained: Yes, please see patient chart for sign off.   Kathan Kirker, [facility]                Trygve Thal, [facility] "

## 2024-02-12 ENCOUNTER — Encounter: Payer: Self-pay | Admitting: Family Medicine

## 2024-02-12 ENCOUNTER — Telehealth: Payer: Self-pay

## 2024-02-12 ENCOUNTER — Encounter: Payer: Self-pay | Admitting: Oncology

## 2024-02-12 ENCOUNTER — Other Ambulatory Visit (HOSPITAL_COMMUNITY): Payer: Self-pay

## 2024-02-12 MED ORDER — CYCLOBENZAPRINE HCL 5 MG PO TABS: 5.0000 mg | ORAL_TABLET | Freq: Three times a day (TID) | ORAL | 1 refills | Status: AC | PRN

## 2024-02-12 NOTE — Telephone Encounter (Signed)
 Last office visit 12/31/2023 with Isabel Foley for Sinusitis.  Last refilled ?Isabel Foley  Next Appt: 02/27/24 for 6 month follow up.

## 2024-02-12 NOTE — Telephone Encounter (Signed)
 Pharmacy Patient Advocate Encounter   Received notification from Pt Calls Messages that prior authorization for Ubrelvy  100 is required/requested.   Insurance verification completed.   The patient is insured through [employer].   Per test claim: PA required; PA submitted to above mentioned insurance via Latent Key/confirmation #[id number] Status is pending

## 2024-02-12 NOTE — Telephone Encounter (Signed)
 Can we follow up on this PA.  Patient got a letter stating more information is needed.

## 2024-02-13 ENCOUNTER — Encounter: Payer: Self-pay | Admitting: Oncology

## 2024-02-13 ENCOUNTER — Ambulatory Visit: Admission: RE | Admit: 2024-02-13 | Source: Ambulatory Visit

## 2024-02-13 DIAGNOSIS — Z1231 Encounter for screening mammogram for malignant neoplasm of breast: Secondary | ICD-10-CM

## 2024-02-16 ENCOUNTER — Other Ambulatory Visit

## 2024-02-18 ENCOUNTER — Ambulatory Visit: Admitting: Family Medicine

## 2024-02-27 ENCOUNTER — Ambulatory Visit: Admitting: Family Medicine

## 2024-03-02 ENCOUNTER — Ambulatory Visit: Admitting: Psychology

## 2024-12-21 ENCOUNTER — Encounter: Admitting: Dermatology
